# Patient Record
Sex: Male | Born: 1950
Health system: Southern US, Community
[De-identification: ages and names within clinical notes are randomized; demographics above are authoritative.]

## PROBLEM LIST (undated history)

## (undated) DIAGNOSIS — I200 Unstable angina: Secondary | ICD-10-CM

## (undated) DIAGNOSIS — C801 Malignant (primary) neoplasm, unspecified: Secondary | ICD-10-CM

## (undated) DIAGNOSIS — IMO0002 Reserved for concepts with insufficient information to code with codable children: Secondary | ICD-10-CM

## (undated) DIAGNOSIS — I219 Acute myocardial infarction, unspecified: Secondary | ICD-10-CM

## (undated) DIAGNOSIS — D369 Benign neoplasm, unspecified site: Secondary | ICD-10-CM

## (undated) DIAGNOSIS — R972 Elevated prostate specific antigen [PSA]: Secondary | ICD-10-CM

## (undated) DIAGNOSIS — I639 Cerebral infarction, unspecified: Secondary | ICD-10-CM

## (undated) DIAGNOSIS — I509 Heart failure, unspecified: Secondary | ICD-10-CM

## (undated) DIAGNOSIS — I739 Peripheral vascular disease, unspecified: Secondary | ICD-10-CM

## (undated) DIAGNOSIS — J449 Chronic obstructive pulmonary disease, unspecified: Secondary | ICD-10-CM

## (undated) DIAGNOSIS — IMO0001 Reserved for inherently not codable concepts without codable children: Secondary | ICD-10-CM

## (undated) DIAGNOSIS — E785 Hyperlipidemia, unspecified: Secondary | ICD-10-CM

## (undated) DIAGNOSIS — Z720 Tobacco use: Secondary | ICD-10-CM

## (undated) DIAGNOSIS — I2510 Atherosclerotic heart disease of native coronary artery without angina pectoris: Secondary | ICD-10-CM

## (undated) HISTORY — DX: Acute myocardial infarction, unspecified: I21.9

## (undated) HISTORY — DX: Malignant (primary) neoplasm, unspecified: C80.1

## (undated) HISTORY — PX: COLONOSCOPY W/ BIOPSIES: SHX1374

## (undated) HISTORY — DX: Reserved for inherently not codable concepts without codable children: IMO0001

## (undated) HISTORY — DX: Unstable angina: I20.0

## (undated) HISTORY — DX: Benign neoplasm, unspecified site: D36.9

## (undated) HISTORY — DX: Reserved for concepts with insufficient information to code with codable children: IMO0002

## (undated) HISTORY — DX: Heart failure, unspecified: I50.9

## (undated) HISTORY — PX: OTHER SURGICAL HISTORY: SHX169

## (undated) HISTORY — DX: Chronic obstructive pulmonary disease, unspecified: J44.9

## (undated) HISTORY — DX: Cerebral infarction, unspecified: I63.9

---

## 2007-08-08 ENCOUNTER — Ambulatory Visit: Payer: Self-pay | Admitting: Gastroenterology

## 2007-08-15 ENCOUNTER — Ambulatory Visit: Payer: Self-pay | Admitting: Gastroenterology

## 2007-08-15 ENCOUNTER — Encounter: Payer: Self-pay | Admitting: Gastroenterology

## 2008-02-24 ENCOUNTER — Encounter: Admission: RE | Admit: 2008-02-24 | Discharge: 2008-02-24 | Payer: Self-pay | Admitting: Family Medicine

## 2008-02-29 ENCOUNTER — Ambulatory Visit: Payer: Self-pay | Admitting: Cardiology

## 2008-03-12 ENCOUNTER — Ambulatory Visit: Payer: Self-pay

## 2008-04-03 ENCOUNTER — Ambulatory Visit: Payer: Self-pay | Admitting: Cardiovascular Disease

## 2008-04-30 ENCOUNTER — Encounter: Admission: RE | Admit: 2008-04-30 | Discharge: 2008-04-30 | Payer: Self-pay | Admitting: Interventional Cardiology

## 2008-05-29 ENCOUNTER — Ambulatory Visit: Payer: Self-pay | Admitting: Vascular Surgery

## 2008-06-29 HISTORY — PX: OTHER SURGICAL HISTORY: SHX169

## 2008-12-27 DIAGNOSIS — I2510 Atherosclerotic heart disease of native coronary artery without angina pectoris: Secondary | ICD-10-CM

## 2008-12-27 HISTORY — DX: Atherosclerotic heart disease of native coronary artery without angina pectoris: I25.10

## 2009-01-21 ENCOUNTER — Encounter: Admission: RE | Admit: 2009-01-21 | Discharge: 2009-01-21 | Payer: Self-pay | Admitting: Interventional Cardiology

## 2009-01-25 ENCOUNTER — Ambulatory Visit (HOSPITAL_COMMUNITY): Admission: EM | Admit: 2009-01-25 | Discharge: 2009-01-26 | Payer: Self-pay | Admitting: *Deleted

## 2009-01-25 ENCOUNTER — Inpatient Hospital Stay (HOSPITAL_BASED_OUTPATIENT_CLINIC_OR_DEPARTMENT_OTHER): Admission: RE | Admit: 2009-01-25 | Discharge: 2009-01-25 | Payer: Self-pay | Admitting: Interventional Cardiology

## 2009-01-25 ENCOUNTER — Ambulatory Visit: Payer: Self-pay | Admitting: *Deleted

## 2009-02-01 ENCOUNTER — Ambulatory Visit: Payer: Self-pay | Admitting: Vascular Surgery

## 2009-02-12 ENCOUNTER — Ambulatory Visit: Payer: Self-pay | Admitting: Vascular Surgery

## 2009-02-19 ENCOUNTER — Encounter: Admission: RE | Admit: 2009-02-19 | Discharge: 2009-02-19 | Payer: Self-pay | Admitting: Vascular Surgery

## 2009-02-28 ENCOUNTER — Ambulatory Visit: Payer: Self-pay | Admitting: Vascular Surgery

## 2009-03-21 ENCOUNTER — Ambulatory Visit: Payer: Self-pay | Admitting: Vascular Surgery

## 2009-04-02 ENCOUNTER — Inpatient Hospital Stay (HOSPITAL_COMMUNITY): Admission: RE | Admit: 2009-04-02 | Discharge: 2009-04-06 | Payer: Self-pay | Admitting: Vascular Surgery

## 2009-04-02 ENCOUNTER — Ambulatory Visit: Payer: Self-pay | Admitting: Vascular Surgery

## 2009-04-09 ENCOUNTER — Ambulatory Visit: Payer: Self-pay | Admitting: Vascular Surgery

## 2009-04-18 ENCOUNTER — Ambulatory Visit: Payer: Self-pay | Admitting: Vascular Surgery

## 2009-06-20 ENCOUNTER — Ambulatory Visit: Payer: Self-pay | Admitting: Vascular Surgery

## 2009-06-29 DIAGNOSIS — I639 Cerebral infarction, unspecified: Secondary | ICD-10-CM

## 2009-06-29 HISTORY — DX: Cerebral infarction, unspecified: I63.9

## 2009-07-28 ENCOUNTER — Inpatient Hospital Stay (HOSPITAL_COMMUNITY): Admission: EM | Admit: 2009-07-28 | Discharge: 2009-08-01 | Payer: Self-pay | Admitting: Emergency Medicine

## 2009-07-30 ENCOUNTER — Encounter (INDEPENDENT_AMBULATORY_CARE_PROVIDER_SITE_OTHER): Payer: Self-pay | Admitting: Family Medicine

## 2009-07-31 ENCOUNTER — Ambulatory Visit: Payer: Self-pay | Admitting: Physical Medicine & Rehabilitation

## 2009-08-01 ENCOUNTER — Inpatient Hospital Stay (HOSPITAL_COMMUNITY)
Admission: RE | Admit: 2009-08-01 | Discharge: 2009-08-14 | Payer: Self-pay | Admitting: Physical Medicine & Rehabilitation

## 2009-09-23 ENCOUNTER — Encounter
Admission: RE | Admit: 2009-09-23 | Discharge: 2009-12-22 | Payer: Self-pay | Admitting: Physical Medicine & Rehabilitation

## 2009-09-24 ENCOUNTER — Ambulatory Visit: Payer: Self-pay | Admitting: Physical Medicine & Rehabilitation

## 2009-10-01 ENCOUNTER — Encounter
Admission: RE | Admit: 2009-10-01 | Discharge: 2009-12-30 | Payer: Self-pay | Admitting: Physical Medicine & Rehabilitation

## 2009-10-22 ENCOUNTER — Ambulatory Visit: Payer: Self-pay | Admitting: Physical Medicine & Rehabilitation

## 2009-11-04 ENCOUNTER — Ambulatory Visit: Payer: Self-pay | Admitting: Vascular Surgery

## 2010-07-20 ENCOUNTER — Encounter: Payer: Self-pay | Admitting: Cardiovascular Disease

## 2010-09-14 LAB — COMPREHENSIVE METABOLIC PANEL
Alkaline Phosphatase: 70 U/L (ref 39–117)
CO2: 25 mEq/L (ref 19–32)
Calcium: 9 mg/dL (ref 8.4–10.5)
Chloride: 104 mEq/L (ref 96–112)
Creatinine, Ser: 1.18 mg/dL (ref 0.4–1.5)
Glucose, Bld: 103 mg/dL — ABNORMAL HIGH (ref 70–99)
Potassium: 3.4 mEq/L — ABNORMAL LOW (ref 3.5–5.1)
Sodium: 135 mEq/L (ref 135–145)

## 2010-09-14 LAB — GLUCOSE, CAPILLARY: Glucose-Capillary: 115 mg/dL — ABNORMAL HIGH (ref 70–99)

## 2010-09-14 LAB — LIPID PANEL
HDL: 38 mg/dL — ABNORMAL LOW (ref >39)
Total CHOL/HDL Ratio: 5.8 RATIO
Triglycerides: 212 mg/dL — ABNORMAL HIGH (ref <150)
VLDL: 42 mg/dL — ABNORMAL HIGH (ref 0–40)

## 2010-09-14 LAB — DIFFERENTIAL
Eosinophils Absolute: 0.3 10*3/uL (ref 0.0–0.7)
Lymphocytes Relative: 36 % (ref 12–46)
Lymphs Abs: 2.8 10*3/uL (ref 0.7–4.0)
Monocytes Absolute: 0.6 10*3/uL (ref 0.1–1.0)
Monocytes Relative: 8 % (ref 3–12)

## 2010-09-14 LAB — POCT I-STAT, CHEM 8
BUN: 15 mg/dL (ref 6–23)
Calcium, Ion: 1.19 mmol/L (ref 1.12–1.32)
Creatinine, Ser: 1.1 mg/dL (ref 0.4–1.5)
Hemoglobin: 14.3 g/dL (ref 13.0–17.0)
Sodium: 141 mEq/L (ref 135–145)

## 2010-09-14 LAB — CBC
HCT: 43 % (ref 39.0–52.0)
Hemoglobin: 14.7 g/dL (ref 13.0–17.0)
Platelets: 200 10*3/uL (ref 150–400)
RBC: 4.76 MIL/uL (ref 4.22–5.81)
WBC: 7.7 10*3/uL (ref 4.0–10.5)

## 2010-09-17 LAB — CBC
Hemoglobin: 14.6 g/dL (ref 13.0–17.0)
MCHC: 33.9 g/dL (ref 30.0–36.0)
Platelets: 184 10*3/uL (ref 150–400)
RDW: 14.1 % (ref 11.5–15.5)

## 2010-09-17 LAB — DIFFERENTIAL
Basophils Relative: 1 % (ref 0–1)
Eosinophils Absolute: 0.2 10*3/uL (ref 0.0–0.7)
Eosinophils Relative: 2 % (ref 0–5)
Monocytes Absolute: 0.6 10*3/uL (ref 0.1–1.0)
Monocytes Relative: 8 % (ref 3–12)

## 2010-09-17 LAB — CARDIAC PANEL(CRET KIN+CKTOT+MB+TROPI)
Relative Index: INVALID (ref 0.0–2.5)
Total CK: 43 U/L (ref 7–232)

## 2010-09-17 LAB — COMPREHENSIVE METABOLIC PANEL
ALT: 24 U/L (ref 0–53)
Albumin: 3.5 g/dL (ref 3.5–5.2)
Alkaline Phosphatase: 67 U/L (ref 39–117)
Potassium: 3.9 mEq/L (ref 3.5–5.1)
Sodium: 140 mEq/L (ref 135–145)
Total Protein: 6.3 g/dL (ref 6.0–8.3)

## 2010-10-02 LAB — TYPE AND SCREEN

## 2010-10-02 LAB — CBC
HCT: 36 % — ABNORMAL LOW (ref 39.0–52.0)
HCT: 45.1 % (ref 39.0–52.0)
Hemoglobin: 13.3 g/dL (ref 13.0–17.0)
MCHC: 34.5 g/dL (ref 30.0–36.0)
MCV: 92.8 fL (ref 78.0–100.0)
MCV: 93.2 fL (ref 78.0–100.0)
MCV: 94.5 fL (ref 78.0–100.0)
Platelets: 127 10*3/uL — ABNORMAL LOW (ref 150–400)
Platelets: 141 10*3/uL — ABNORMAL LOW (ref 150–400)
Platelets: 197 10*3/uL (ref 150–400)
RBC: 4.07 MIL/uL — ABNORMAL LOW (ref 4.22–5.81)
RBC: 4.14 MIL/uL — ABNORMAL LOW (ref 4.22–5.81)
RDW: 13.2 % (ref 11.5–15.5)
RDW: 13.2 % (ref 11.5–15.5)
WBC: 13.3 10*3/uL — ABNORMAL HIGH (ref 4.0–10.5)
WBC: 5.9 10*3/uL (ref 4.0–10.5)
WBC: 9.3 10*3/uL (ref 4.0–10.5)
WBC: 9.7 10*3/uL (ref 4.0–10.5)

## 2010-10-02 LAB — POCT I-STAT 7, (LYTES, BLD GAS, ICA,H+H)
Acid-base deficit: 2 mmol/L (ref 0.0–2.0)
Bicarbonate: 22.5 mEq/L (ref 20.0–24.0)
O2 Saturation: 100 %
pO2, Arterial: 184 mmHg — ABNORMAL HIGH (ref 80.0–100.0)

## 2010-10-02 LAB — BASIC METABOLIC PANEL
BUN: 9 mg/dL (ref 6–23)
CO2: 23 mEq/L (ref 19–32)
Calcium: 7.9 mg/dL — ABNORMAL LOW (ref 8.4–10.5)
Calcium: 8.4 mg/dL (ref 8.4–10.5)
Chloride: 111 mEq/L (ref 96–112)
Creatinine, Ser: 0.96 mg/dL (ref 0.4–1.5)
GFR calc Af Amer: >60 mL/min (ref >60)
GFR calc non Af Amer: >60 mL/min (ref >60)
Glucose, Bld: 136 mg/dL — ABNORMAL HIGH (ref 70–99)
Sodium: 138 mEq/L (ref 135–145)

## 2010-10-02 LAB — COMPREHENSIVE METABOLIC PANEL
ALT: 12 U/L (ref 0–53)
AST: 24 U/L (ref 0–37)
Albumin: 2.8 g/dL — ABNORMAL LOW (ref 3.5–5.2)
Albumin: 4.2 g/dL (ref 3.5–5.2)
Alkaline Phosphatase: 46 U/L (ref 39–117)
BUN: 16 mg/dL (ref 6–23)
CO2: 24 mEq/L (ref 19–32)
Chloride: 107 mEq/L (ref 96–112)
Chloride: 108 mEq/L (ref 96–112)
Creatinine, Ser: 0.9 mg/dL (ref 0.4–1.5)
Creatinine, Ser: 1.05 mg/dL (ref 0.4–1.5)
GFR calc Af Amer: >60 mL/min (ref >60)
GFR calc non Af Amer: >60 mL/min (ref >60)
GFR calc non Af Amer: >60 mL/min (ref >60)
Potassium: 3.7 mEq/L (ref 3.5–5.1)
Total Bilirubin: 0.7 mg/dL (ref 0.3–1.2)
Total Bilirubin: 0.8 mg/dL (ref 0.3–1.2)

## 2010-10-02 LAB — PROTIME-INR
INR: 0.9 (ref 0.00–1.49)
INR: 1.05 (ref 0.00–1.49)
Prothrombin Time: 12.4 seconds (ref 11.6–15.2)

## 2010-10-02 LAB — URINALYSIS, ROUTINE W REFLEX MICROSCOPIC
Bilirubin Urine: NEGATIVE
Ketones, ur: NEGATIVE mg/dL
Nitrite: NEGATIVE
Protein, ur: NEGATIVE mg/dL
Specific Gravity, Urine: 1.004 — ABNORMAL LOW (ref 1.005–1.030)
Urobilinogen, UA: 0.2 mg/dL (ref 0.0–1.0)

## 2010-10-02 LAB — BLOOD GAS, ARTERIAL
Acid-base deficit: 1.9 mmol/L (ref 0.0–2.0)
Patient temperature: 98.6
TCO2: 23 mmol/L (ref 0–100)

## 2010-10-02 LAB — MRSA PCR SCREENING: MRSA by PCR: NEGATIVE

## 2010-10-02 LAB — APTT: aPTT: 24 seconds (ref 24–37)

## 2010-10-05 LAB — CBC
HCT: 39.1 % (ref 39.0–52.0)
MCV: 92.6 fL (ref 78.0–100.0)
Platelets: 201 10*3/uL (ref 150–400)
RBC: 4.22 MIL/uL (ref 4.22–5.81)
WBC: 8.3 10*3/uL (ref 4.0–10.5)

## 2010-10-05 LAB — BASIC METABOLIC PANEL
BUN: 16 mg/dL (ref 6–23)
Chloride: 108 mEq/L (ref 96–112)
GFR calc Af Amer: >60 mL/min (ref >60)
GFR calc non Af Amer: >60 mL/min (ref >60)
Potassium: 3.9 mEq/L (ref 3.5–5.1)

## 2010-11-11 NOTE — Discharge Summary (Signed)
NAMECASSIE, [name]              ACCOUNT NO.:  [id number]   MEDICAL RECORD NO.:  [id number]          PATIENT TYPE:  OIB   LOCATION:  2021                         FACILITY:  [facility]   PHYSICIAN:  Balinda Quails, M.D.    DATE OF BIRTH:  29-Sep-1950   DATE OF ADMISSION:  01/25/2009  DATE OF DISCHARGE:  01/26/2009                               DISCHARGE SUMMARY   FINAL DISCHARGE DIAGNOSES:  1. Ischemic right upper extremity following cardiac catheterization.  2. Coronary artery disease.   PROCEDURES PERFORMED:  Thrombectomy of right brachial artery.   COMPLICATIONS:  None.   CONDITION AT DISCHARGE:  Stable improving.   DISCHARGE MEDICATIONS:  He is instructed to resume all previous  medications consisting of multiple vitamins as directed on niacin 100 mg  as directed, fish oil 500 mg as directed, and he is given a prescription  for Percocet 5/325 one p.o. q.4 h. p.r.n. pain.   DISPOSITION:  He is being discharged home in stable condition with his  wound healing well.  He is given careful instructions regarding the care  of his wound.  He is given a return appointment to see Dr. Madilyn Fireman in 2  weeks.  The office will arrange a visit.   BRIEF IDENTIFYING STATEMENT:  This very pleasant 60 year old man  underwent cardiac catheterization via brachial artery.  Following the  procedure in the post anesthesia care unit, he was noted to have  decreased pulses in his right arm.  Dr. Madilyn Fireman evaluated him and felt him  to have a brachial thrombus.  He recommended thrombectomy.  Mr. Whilden  was informed of the risks and benefits of the procedure and after  careful consideration, he elected to proceed with surgery.   HOSPITAL COURSE:  He was taken to the operating room and underwent the  aforementioned right brachial thrombectomy.  For complete details,  please refer to the typed operative report.  The procedure was without  complications.  He was returned to the post anesthesia care unit  extubated.  Following extubation, he was admitted  for 23-hour observation period.  The following morning, he was found to  be stand in stable condition.  His wound was healing well.  He had  palpable pulses.  He was desirous of discharge and was discharged home  in stable condition.      Wilmon Arms, PA      P. Liliane Bade, M.D.  Electronically Signed    KEL/MEDQ  D:  01/26/2009  T:  01/27/2009  Job:  [id number]

## 2010-11-11 NOTE — Assessment & Plan Note (Signed)
OFFICE VISIT   [name], [name]  DOB:  12/12/1950                                       06/20/2009  [id number]#:[id number]   I saw this patient in the office today for follow up after his  aortofemoral bypass graft which was done on 04/02/2009.  His complaints  are some soreness in his incision occasionally and also some tingling in  his toes.  His appetite has returned to normal and he has been gradually  resuming his normal activities.  He has been working in the yard some.  He has not yet returned to work.   PHYSICAL EXAMINATION:  On examination, blood pressure is 162/103, heart  rate is 71, temperature 98.  Lungs:  Clear bilaterally to auscultation.  Abdomen:  Soft, nontender.  His incision has healed nicely with no  evidence of a hernia.  He has palpable femoral and pedal pulses  bilaterally.   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  I think he would be at increased risk  for hernia if he did this type of lifting before 6 months  postoperatively.  For this reason, I have given him a return-to-work  slip for October 01, 2008.  I plan on seeing him back in 6 months with  follow-up ABIs.  He knows to call sooner if he has problems.     Di Kindle. Edilia Bo, M.D.  Electronically Signed   CSD/MEDQ  D:  06/20/2009  T:  06/25/2009  Job:  [id number]

## 2010-11-11 NOTE — Assessment & Plan Note (Signed)
OFFICE VISIT   [name], [name]  DOB:  Dec 04, 1950                                       02/28/2009  [id number]#:[id number]   I saw the patient in the office today for continued followup of his  aortoiliac occlusive disease.  When I saw him last we were discussing  possibly proceeding with aortofemoral bypass grafting.  It has been some  time since he had had a CT angiogram so he had a followup CT angio and  came in to discuss these results.   CT angio does show a right common iliac artery occlusion and external  iliac artery occlusion.  On the left side he has an external iliac  artery occlusion.  His femoral vessels and below are patent and I think  he would be a candidate for aortofemoral bypass grafting.  Consideration  should be given for an end-to-side proximal anastomosis given the  bilateral external iliac artery occlusions if this is technically  possible.  He has disabling claudication which is really making it  difficult for him to work.  We have previously discussed nonoperative  treatment including tobacco cessation and a structured walking program  and he is working on this as best he can.   PHYSICAL EXAMINATION:  Blood pressure is 133/87, heart rate is 76.  Lungs are clear bilaterally to auscultation.  I cannot palpate femoral  pulses.  Both feet appear adequately perfused without ischemic ulcers.   He currently would like to hold off on aortofemoral bypass grafting as  he has some concerns about the finances involved and is looking into his  [employer] benefits.  He would like to be seen back in 3 months.  We will check  ABIs at that time.  He knows to call sooner if he has problems.  In the  meantime he will continue working on his tobacco cessation and  structured walking program.   Di Kindle. Edilia Bo, M.D.  Electronically Signed   CSD/MEDQ  D:  02/28/2009  T:  03/01/2009  Job:  [id number]   cc:   Lyn Records, M.D.  Ernestina Penna, M.D.

## 2010-11-11 NOTE — Op Note (Signed)
NAMESOU, NOHR              ACCOUNT NO.:  [id number]   MEDICAL RECORD NO.:  [id number]          PATIENT TYPE:  OIB   LOCATION:  2021                         FACILITY:  [facility]   PHYSICIAN:  Balinda Quails, M.D.    DATE OF BIRTH:  1951/05/06   DATE OF PROCEDURE:  01/25/2009  DATE OF DISCHARGE:                               OPERATIVE REPORT   SURGEON:  Balinda Quails, MD   ASSISTANT:  Wilmon Arms, PA   ANESTHESIA:  General endotracheal.   ANESTHESIOLOGIST:  Kaylyn Layer. Ossey, MD   PREOPERATIVE DIAGNOSIS:  Ischemic right upper extremity.   POSTOPERATIVE DIAGNOSIS:  Ischemic right upper extremity.   PROCEDURES:  1. Exploration of right brachial artery.  2. Thrombectomy and repair of right brachial artery.   CLINICAL NOTE:  [name] is a 60 year old gentleman with advanced  peripheral vascular disease and severe aortoiliac occlusive disease.  He  underwent cardiac catheterization via right brachial approach by Dr.  Katrinka Blazing today.  In the recovery area following his catheterization, he was  noted to have a cool tingling right hand with absent radial and ulnar  pulses.  Consultation revealed evidence of a right brachial artery  occlusion.  Brought to the operating room at this time for exploration  of the right brachial artery.   OPERATIVE PROCEDURE:  The patient brought to the operating room in  stable condition.  Placed under general endotracheal anesthesia.  The  right arm prepped and draped in sterile fashion.  Skin and subcutaneous  tissues instilled with 1% Xylocaine with epinephrine.   A curvilinear skin incision made along the course of the right brachial  artery and the antecubital fossa.  Dissection carried through the  subcutaneous tissue with electrocautery.  Basilic vein was mobilized and  reflected medially.  The brachial artery identified.  A short segment of  brachial artery dissected out and the site of catheterization was  evident.  The artery was  thrombosed.  The patient administered 5000  units of heparin intravenously.  The brachial artery controlled with  bulldog clamps.  A transverse arteriotomy made at the site of  catheterization.  Fresh thrombus was present.  This was removed with  direct vision initially.  A 3 Fogarty was then passed distally down the  brachial artery without return of significant thrombus and good  backbleeding obtained.  A 3 Fogarty was then passed proximally up to the  brachial artery.  Several passes made, thrombus removed with excellent  inflow obtained.  No further thrombus returned.  Transverse arteriotomy  was closed with interrupted 7-0 Prolene suture.  The vessel then flushed  and clamps removed.  Excellent flow present by Doppler.  Adequate  hemostasis obtained.  Sponge and instrument counts correct.   The subcutaneous tissue then closed with running 3-0 Vicryl suture in a  single layer.  The skin closed with 4-0 Monocryl and Dermabond.  The  patient transferred to the recovery room in stable condition.      Balinda Quails, M.D.  Electronically Signed     Balinda Quails, M.D.  Electronically Signed  PGH/MEDQ  D:  01/25/2009  T:  01/26/2009  Job:  [id number]   cc:   Lyn Records, M.D.

## 2010-11-11 NOTE — Assessment & Plan Note (Signed)
[facility]                            CARDIOLOGY OFFICE NOTE   YUREM, VINER                       MRN:          [id number]  DATE:02/29/2008                            DOB:          10/19/1950    PRIMARY CARE Chaniqua Brisby:  Belva Agee, NP   REASON FOR PRESENTATION:  Evaluate the patient with claudication and  multiple cardiovascular risk factors.   HISTORY OF PRESENT ILLNESS:  The patient is a pleasant 60 year old  gentleman without a prior cardiac history.  However, he has significant  cardiovascular risk factors.  He does describe leg pain.  This happens  when he walks moderate distance on a level ground.  He will get pain in  his calves and over the time, he has now developed pain in his hips as  well.  It stops when he rests.  He is still able to do his job and  chores of daily living.  He does get some chest discomfort sporadically.  It is not reproducible with exertion.  He does not describe any neck or  arm discomfort.  He has not had any palpitation, presyncope, or syncope.  He has no PND or orthopnea and denies any significant dyspnea with  exertion.  He has had no prior cardiovascular testing.   PAST MEDICAL HISTORY:  Hyperlipidemia and tobacco abuse.   PAST SURGICAL HISTORY:  Vasectomy.   ALLERGIES:  None.   MEDICATIONS:  Multivitamin, fish oil, niacin over the counter, red yeast  rice, EDTA.   SOCIAL HISTORY:  The patient is a Naval architect.  He is married.  He has  3 children.  He has been smoking one and a half packs of cigarettes for  40 years.  He drinks alcohol socially.   FAMILY HISTORY:  Very remarkable for early coronary artery disease with  his father dying at 44 of myocardial infarction, another brother died of  a myocardial infarction at 28, and another brother with a myocardial  infarction at 38, is still alive.   REVIEW OF SYSTEMS:  As stated in the HPI and negative for all other  systems.   PHYSICAL  EXAMINATION:  GENERAL:  The patient is pleasant and in no  distress.  VITAL SIGNS:  Blood pressure 120/84, heart rate 71 and regular, weight  192 pounds, and body mass index 28.  HEENT:  Eyelids are unremarkable; pupils equal, round, and reactive to  light; fundi not visualized; oral mucosa unremarkable; edentulous.  NECK:  No jugular venous distention at 45 degrees, carotid upstroke  brisk and symmetric, no bruits, no thyromegaly.  LYMPHATICS:  No cervical, axillary, or inguinal adenopathy.  LUNGS:  Decreased breath sounds without wheezing or crackles.  BACK:  No costovertebral angle tenderness.  CHEST:  Unremarkable.  HEART:  PMI not displaced or sustained, S1 and S2 within normal limits,  no S3, no S4, no clicks, no rubs, no murmurs.  ABDOMEN:  Flat, positive bowel sounds normal in frequency and pitch, no  bruits, no rebound, no guarding, no midline pulsatile mass, no  hepatomegaly, no splenomegaly.  SKIN:  No rashes, no nodules.  EXTREMITIES:  2+ upper pulses, absent femorals bilaterally, absent  dorsalis pedis, posterior tibialis, and poplietals bilaterally, no  cyanosis, no clubbing, no edema.  NEURO:  Oriented to person, place, and time.  Cranial nerves II through  XII grossly intact.  Motor grossly intact.   EKG:  Sinus rhythm, rate 71, leftward axis, incomplete right bundle-  branch block, no acute ST-T wave changes.   ASSESSMENT AND PLAN:  1. Peripheral vascular disease.  The patient has claudication and      decreased pulses and almost definitely peripheral vascular disease.      We will get ABI's and proceed from there.  He needs aggressive risk      reduction.  2. Chest discomfort.  The patient has some chest discomfort and      multiple cardiovascular risk factors.  The  pretest probability of      obstructive coronary disease is least moderately high.  He needs a      stress perfusion study.  We will try an exercise perfusion study.      I do not know should he be  able to walk on the treadmill with his      claudication.  If not it can be converted to an adenosine      Cardiolite.  Further evaluation will be based on these results.  I      have discussed with the patient that his probability of having at      least nonobstructive coronary disease is nearly 100%, and he needs      a fairly aggressive risk reduction.  3. Tobacco.  We spent quite a bit of time talking about the need to      stop smoking.  He has apparently tried Chantix, and it gave him bad      dreams.  He is going to thing about nicotine replacement, but      probably will try cold [place] if he tries anything at all (greater      than 3 minutes discussing this).  4. Dyslipidemia.  I have counseled him on indication for statin and      would suggest this given his very strong family history and      multiple risk factors and evidence of peripheral vascular disease.      He is going to consider this.  For now, he is continuing fish oil,      niacin, and red yeast rice and understands that I think his goal      LDL should be at least less than 100 and ideally less than 70 with      an HDL greater than 40 and ideally greater than 50.  He will not      get there with these are over-the-counter therapies.  5. Followup.  I would like to see him back in a few months or sooner      based on the results of any abnormalities on this test or      increasing symptoms.     Rollene Rotunda, MD, [facility]  Electronically Signed    JH/MedQ  DD: 02/29/2008  DT: 03/01/2008  Job #: [id number]   cc:   Belva Agee NP

## 2010-11-11 NOTE — Assessment & Plan Note (Signed)
OFFICE VISIT   [name], [name]  DOB:  Apr 28, 1951                                       04/18/2009  BJYNW#:[id number]   I saw the patient in the office today for followup after his recent  aortofemoral bypass graft.  This is a pleasant 60 year old gentleman who  I have been following with aortoiliac occlusive disease.  His symptoms  progressed and he wished to pursue revascularization.  I felt he was a  reasonable candidate for aortofemoral bypass grafting.  He underwent  preoperative cardiac evaluation by Dr. Katrinka Blazing and underwent aortofemoral  bypass graft on 04/02/2009.  He did well postoperatively and was  discharged on postoperative day #4 and he returns for his first  outpatient visit.   Overall he has been doing quite well and he has been ambulating without  difficulty and has no specific complaints.   PHYSICAL EXAMINATION:  Blood pressure is 131/86, heart rate is 73.  His  abdominal incision has healed nicely as are his groin incisions.  He has  palpable femoral, dorsalis pedis and posterior tibial pulses  bilaterally.  His feet are warm and well-perfused without ischemic  ulcers.   He had Doppler study in the office today which shows ABIs of 100%  bilaterally.   Overall I am pleased with his progress and I have encouraged him to  gradually resume his normal activities.  He does a fair amount of heavy  lifting at work and is not ready to return to work probably until  February.  I plan on seeing him back in 2 months.  He knows to call  sooner if he has problems.   Di Kindle. Edilia Bo, M.D.  Electronically Signed   CSD/MEDQ  D:  04/18/2009  T:  04/19/2009  Job:  [id number]   cc:   Ernestina Penna, M.D.  Lyn Records, M.D.

## 2010-11-11 NOTE — Assessment & Plan Note (Signed)
OFFICE VISIT   [name], [name]  DOB:  Feb 10, 1951                                       02/01/2009  EAVWU#:[id number]   The patient presents today for follow-up after right brachial  exploration, thrombectomy and repair.  He had undergone cardiac  catheterization and had a postoperative complication of brachial artery  occlusion.  He has numbness and discomfort in his forearm.  Fortunately,  his hand is well perfused, he has a 2+ radial and ulnar pulse and his  incision is healing well over his brachial artery.  I explained this is  related to both ischemia around the time of the occlusion and irritation  of the nerves with the brachial artery exploration and this should  resolve with time.  He is questioning on return to work.  He does heavy  work with this arm and as such he needs to be out at least 2 weeks then  return to work for 08/17.  He is having increasingly severe lower  extremity claudication symptoms.  He had been evaluated by Dr. Edilia Bo  in the past and will see him in the next several weeks for discussion  about potential aortofemoral bypass graft.   Larina Earthly, M.D.  Electronically Signed   TFE/MEDQ  D:  02/01/2009  T:  02/04/2009  Job:  [id number]   cc:   Lyn Records, M.D.  Di Kindle. Edilia Bo, M.D.

## 2010-11-11 NOTE — Cardiovascular Report (Signed)
[name], [name] NO.:  [id number]   MEDICAL RECORD NO.:  [id number]          PATIENT TYPE:  OIB   LOCATION:  1967                         FACILITY:  [facility]   PHYSICIAN:  Lyn Records, M.D.   DATE OF BIRTH:  05-24-1951   DATE OF PROCEDURE:  01/25/2009  DATE OF DISCHARGE:  01/25/2009                            CARDIAC CATHETERIZATION   ADDENDUM TO THE CARDIAC CATHETERIZATION REPORT:  [name] experienced  numbness and tingling in his hand after sheath removal and manual  compression was completed.  The patient came to the recovery area with a  3+ radial pulse in the right arm.  The sheath was removed and pressure  was held.  The initial 15 minutes of pressure was associated with  continued palpable pulse.  After pressure hold was discontinued, there  was slight bleeding from the puncture site.  Again pressure was held.  During this second session of pressure holding, the patient's radial  pulse disappeared.  Hemostasis was eventually achieved after 10-15  minutes.  Doppler evaluation demonstrated a pulse.  There was good color  in the patient's hand, but there was mild tingling.  Because of the  dramatic change in pulse amplitude and mild symptoms, I consulted Dr.  Bud Face of the [facility].  He  will evaluate the patient and determine if exploration of the brachial  puncture site is necessary to remove suspected thrombus.   The patient and the wife were apprised of the findings and the  suspicions.   The procedure was completed at around 1:30 p.m. or slightly before.  The  reduction in pulse was noted at around 2:10 p.m. after sheath hold was  completed.      Lyn Records, M.D.  Electronically Signed     HWS/MEDQ  D:  01/25/2009  T:  01/25/2009  Job:  [id number]   cc:   Ernestina Penna, M.D.  Di Kindle. Edilia Bo, M.D.  Balinda Quails, M.D.

## 2010-11-11 NOTE — Consult Note (Signed)
VASCULAR SURGERY CONSULTATION   CORTEZ, FLIPPEN  DOB:  Feb 26, 1951                                       05/29/2008  ZOXWR#:[id number]   I saw the patient in the office today in consultation concerning  bilateral iliac artery occlusions.  Referred by Dr. Katrinka Blazing.  This is a  pleasant 60 year old gentleman with a long history of bilateral thigh  and calf claudication which he has had for years.  Over the last several  months his symptoms have gradually progressed and he experiences  claudication currently at a short distance of approximately 1 block.  The symptoms are equal on both sides.  They are brought on by ambulation  and relieved with rest.  He does also admit to some occasional cramps in  his legs and pain in his legs when he is sitting, and apparently has  been evaluated by neurology for possible cervical disk disease.  I do  not get any clear-cut history of rest pain or history of nonhealing  ulcers.  He cannot remember any sudden onset of pain, except perhaps  approximately 10 years ago when he is at work he experienced some sudden  pain in his legs.  He did undergo a CT angiogram which showed a right  common iliac artery occlusion and a left external iliac artery occlusion  and therefore he was sent for vascular consultation.   PAST MEDICAL HISTORY:  Significant for hypercholesterolemia.  He denies  any history of diabetes, hypertension, history of previous myocardial  infarction, history of congestive heart failure or history of COPD.   FAMILY HISTORY:  He has had 2 brothers who had heart attacks at age 33.  His father also had coronary artery disease.   SOCIAL HISTORY:  He is married.  He has 3 children.  Works as a Ecologist.  He smoked a pack per day of cigarettes for over 40 years.   ALLERGIES:  No known drug allergies.   MEDICATIONS:  1. Crestor 5 mg p.o. daily.  2. Multivitamin.  3. Niacin.  4. Baby aspirin.  5. Fish oil.   REVIEW OF  SYSTEMS:  GENERAL:  He had no recent weight loss or weight  gain.  He is 190 pounds 5 feet 9 inches tall.  CARDIAC:  He has had some occasional chest tightness although he states  that he had a Cardiolite this summer at the [place] office which was  unremarkable.  He does admit to dyspnea on exertion.  VASCULAR:  He does have claudication in both lower extremities and also  some mild pain in his feet at night.  He had no history of stroke, TIAs  or history of DVT or phlebitis.  Pulmonary, GI, GU, neuro, ortho, psychiatric, ENT, and hematology review  of systems are unremarkable and are documented on the medical history  form in his chart.   PHYSICAL EXAMINATION:  This is a pleasant 60 year old gentleman who  appears his stated age.  His blood pressure 135/84, heart rate is 60.  Neck is supple.  There is no cervical lymphadenopathy.  I do not detect  any carotid bruits.  Lungs are clear bilaterally to auscultation.  On  cardiac exam he has a regular rate and rhythm.  His abdomen is soft and  nontender.  I cannot palpate an aneurysm.  He has normal pitched bowel  sounds.  I cannot palpate femoral popliteal pedal pulses on either side.  Both feet appear adequately perfused without ischemic ulcers.  No  significant lower extremity swelling.  Neurologic:  Exam is nonfocal.  He has good strength in his upper extremities and lower extremities  bilaterally.   I have reviewed his CT scan which shows the infrarenal aorta is patent.  There is then laminated thrombus in the distal aorta and the right  common iliac artery is occluded at its origin.  There is reconstitution  on the right of the common femoral artery.  On the left side the left  external iliac artery is occluded and there is reconstitution again of  the common femoral artery.  The superficial femoral artery appears to be  patent bilaterally.  He appears to have patent 3-vessel runoff  bilaterally.   I have explained to the patient  that the options are conservative  treatment including an attempt at tobacco cessation and a structured  walking program and the potential use of cilostazol versus surgery.  Based on the CT, I do not think is a candidate for endovascular approach  to his iliac artery occlusions.  Given his age I think if 60 left  proceed with surgery, I would recommend aortofemoral bypass grafting.  He feels his symptoms are quite disabling and he is unable to function  at work and would like to consider revascularization, would like to  discuss this further with his family.  We have discussed the indications  for surgery and potential complications including but not limited to  bleeding, wound healing problems, graft infection, MI, renal  insufficiency or other unpredictable medical problems.  All questions  were answered.  If he does elect to proceed with surgery, will try to  get his Cardiolite report from the [place] office and could tentatively  schedule his surgery.  The CT angio shows that he appears to have good  runoff distally.  I will wait to hear from him.  I have explained I  would be happy to see him back to discuss this further if he would like.   Di Kindle. Edilia Bo, M.D.  Electronically Signed  CSD/MEDQ  D:  05/29/2008  T:  05/30/2008  Job:  [id number]   cc:   Lyn Records, M.D.  Ernestina Penna, M.D.

## 2010-11-11 NOTE — H&P (Signed)
HISTORY AND PHYSICAL EXAMINATION   March 21, 2009   Re:  [name], [name]                DOB:  1951-04-06   REASON FOR ADMISSION:  Aortoiliac occlusive disease with disabling  claudication.   HISTORY:  This is a pleasant 60 year old gentleman who I have been  following with aortoiliac occlusive disease.  His most recent CT  angiogram was on 02/19/2009 and this showed a right common iliac artery  occlusion and external iliac artery occlusion.  On the left side he had  an external iliac artery occlusion.  His femoral arteries and below were  patent and he was being considered for aortofemoral bypass grafting.  He  has tried conservative treatment and we have had multiple discussions  about the importance of tobacco cessation.  However, his symptoms have  progressed and he wished to pursue revascularization.  It was felt that  his best option was aortofemoral bypass grafting.   PAST MEDICAL HISTORY:  Significant for hypercholesterolemia.  He denies  any history of diabetes, hypertension, history of previous myocardial  infarction, history of congestive heart failure or history of COPD.   FAMILY HISTORY:  He had two brothers who had heart problems in their  late 35s.  His father also had coronary artery disease.   SOCIAL HISTORY:  He is married and has three children.  He smoked a pack  per day of cigarettes for over 40 years and has recently tried to cut  back but is continuing to smoke.  It is just under one pack per day.   ALLERGIES:  No known drug allergies.   MEDICATIONS:  1. Fish oil 1200 mg a day.  2. Niaspan 500 mg p.o. daily.  3. Multivitamin one p.o. daily.  4. Trilipix daily.   REVIEW OF SYSTEMS:  GENERAL:  He has had no recent weight loss, weight  gain or problem with his appetite.  CARDIAC:  He has had no chest pain, chest pressure, palpitations or  arrhythmias.  He does admit to some dyspnea on exertion.  PULMONARY:  He has had no recent  productive cough, bronchitis, asthma or  wheezing.  GI:  He has had no recent change in his bowel habits and has no history  of peptic ulcer disease.  GU:  He has had no dysuria or frequency.  VASCULAR:  He has significant claudication bilaterally.  He also has  some early rest pain.  Neuro, ortho, psychiatric, ENT and hematology review of systems are  unremarkable.   PHYSICAL EXAMINATION:  General:  This is a pleasant 61 year old  gentleman who appears his stated age.  Vital signs:  Blood pressure is  153/83, heart rate is 64.  HEENT:  Unremarkable.  Neck:  Supple.  There  is no cervical lymphadenopathy.  I do not detect any carotid bruits.  Lungs:  Are clear bilaterally to auscultation.  Cardiac:  He has a  regular rate and rhythm.  Abdomen:  Soft and nontender.  Normal pitched  bowel sounds.  I cannot palpate femoral, popliteal or pedal pulses on  either side.  He has monophasic Doppler signals in both feet with no  ischemic ulcers.  He has no significant lower extremity swelling.  Neurologic:  Exam is nonfocal.   The patient is admitted now for elective aortobifemoral bypass grafting.  We have discussed the indications for surgery and the potential  complications including but not limited to bleeding, wound healing  problems, graft thrombosis,  graft infection, MI or other unpredictable  medical problems.  All of his questions were answered.  He is agreeable  to proceed.  His surgery has been scheduled for October 5.  Of note, we  will likely try to do an end-to-side proximal anastomosis given the  bilateral external iliac artery occlusions.  In addition, we will obtain  a note from Dr. Michaelle Copas office for cardiac clearance and if he needs any  further cardiac workup certainly we would delay his surgery.   Di Kindle. Edilia Bo, M.D.  Electronically Signed   CSD/MEDQ  D:  03/21/2009  T:  03/22/2009  Job:  [id number]   cc:   Ernestina Penna, M.D.  Lyn Records, M.D.

## 2010-11-11 NOTE — Assessment & Plan Note (Signed)
OFFICE VISIT   ANNIE, ROSEBOOM  DOB:  [birth year]                                       02/12/2009  UEAVW#:[id number]   I saw the patient in the office today for continued followup of his  bilateral iliac artery occlusions.  I had seen him in December of 2009  in consultation with this problem.  He had a long history of bilateral  thigh and calf claudication which he had had for years.  His symptoms  have been gradually progressing.  He had a CT angio at that time which  showed that his infrarenal aorta was patent.  The right common iliac  artery was occluded at its origin.  There was reconstitution of the  common femoral artery on the right.  On the left side the left external  iliac artery was occluded with reconstitution of the left common femoral  artery.  Both superficial femoral arteries at that time appeared patent  and he had three vessel runoff bilaterally.  We discussed conservative  treatment with a structured walking program and tobacco cessation versus  aortofemoral bypass grafting at that time and he was considering his  options.   Since I saw the patient last, the patient had been experiencing some  intermittent chest pressure associated with radiation to his jaw and he  underwent cardiac catheterization on 01/25/2009 by Dr. Katrinka Blazing.  This  demonstrated moderate proximal LAD disease with a 70% stenosis in the  apical LAD with normal left ventricular function.  The recommendation  was for aggressive risk factor modification but no other intervention  was necessary.  Of note, he did require repair of the right brachial  artery by Dr. Madilyn Fireman after the procedure as his cath was performed via a  right brachial artery approach.   He comes in today to discuss his claudication.  He says that his  symptoms have gradually progressed over the last several months.  He  experiences claudication in his hips, thighs and calves associated with  ambulation and  relieved with rest that occurs at a fairly short  distance.  He also has rest pain in both feet.  He has had no history of  nonhealing ulcers.  His symptoms are really prohibiting/keeping him from  performing his duties at work as he has to do a fair amount of walking  and lifting.   PAST MEDICAL HISTORY:  Is significant for hypercholesterolemia and a  history of significant tobacco abuse.  He continues to smoke a pack per  day of cigarettes.   REVIEW OF SYSTEMS:  He has had no further chest pain since his cardiac  catheterization.  He denies any significant dyspnea on exertion.  He has  had no recent change in bowel habits.   PHYSICAL EXAMINATION:  General:  This is a pleasant 60 year old  gentleman who appears his stated age.  Vital signs:  His blood pressure  is 139/88, heart rate is 75.  Neck:  I do not detect any carotid bruits.  Lungs:  Are clear bilaterally to auscultation.  Cardiac:  He has a  regular rate and rhythm.  Abdomen:  Soft and nontender.  I cannot  palpate femoral, popliteal or pedal pulses on either side.  He has no  ischemic ulcers.   Given that he has had significant progression of his symptoms he would  like  to proceed with aortofemoral bypass grafting.  I will discuss him  with Dr. Katrinka Blazing to be sure that it is safe from a cardiac standpoint  although the patient states that he has been told that it is safe to  proceed with surgery.  I would like to repeat his CT angio as it has  been 9 months and his symptoms have progressed significantly and I want  to be sure that his anatomy has not changed significantly.  We will also  obtain baseline ABIs when he comes back after his CT angio.  We have  discussed the indications for surgery and the potential complications  including but not limited to bleeding, wound healing problems, graft  thrombosis, graft infection, MI or other unpredictable medical problems.  All of his questions were answered and he is agreeable to  proceed.   Di Kindle. Edilia Bo, M.D.  Electronically Signed   CSD/MEDQ  D:  02/12/2009  T:  02/13/2009  Job:  [id number]   cc:   Lyn Records, M.D.  Ernestina Penna, M.D.

## 2010-11-11 NOTE — Cardiovascular Report (Signed)
NAMEFIRMAN, PETROW NO.:  [id number]   MEDICAL RECORD NO.:  [id number]          PATIENT TYPE:  OIB   LOCATION:  1967                         FACILITY:  [facility]   PHYSICIAN:  Lyn Records, M.D.   DATE OF BIRTH:  [birth year]   DATE OF PROCEDURE:  DATE OF DISCHARGE:  01/25/2009                            CARDIAC CATHETERIZATION   INDICATION:  Recurring episodes of spontaneous chest pressure with  radiation into the neck.  Limiting claudication.  The patient is known  to have a totally occluded distal aorta with absent femoral pulses  bilaterally.   PROCEDURES PERFORMED:  1. Left heart catheterization via the right brachial percutaneous      approach.  2. Left ventriculography.  3. Coronary artery angiography.   DESCRIPTION:  After informed consent, a 5-French sheath was placed in  the right brachial artery using the modified Seldinger technique.Two  thousand units of heparin was administered thru the arterial sheath  immediately after obtaining access. An 5-French A2 multipurpose catheter  was then used to perform right coronary angiography and left  ventriculography.  We used a 5-French KIMI catheter to perform left  coronary angiography.  Hemostasis was achieved by manual compression. No  immediate complications were noted.   RESULTS:  1. Hemodynamic data:      a.     Aortic pressure 123/70 mmHg.      b.     Left ventricular pressure 138/12 mmHg (the aortic pressure       on pullback was 138/72).  2. Left ventriculography:  Left ventricular cavity size and systolic      function are normal.  No mitral regurgitation is noted.  3. Coronary angiography.      a.     Left main coronary:  Normal.      b.     Left anterior descending coronary:  LAD contains proximal       luminal irregularities.  The first septal perforator contains 30%       narrowing at its ostium.  The first diagonal contains ostial 70%       narrowing.  The LAD after the first septal  perforator contains an       eccentric 50%-60% narrowing.  Two moderate-size diagonals arise       beyond this point and are free of any significant obstruction.       The LAD at the apex contains an eccentric 70% narrowing.      c.     Circumflex artery:  The circumflex coronary artery is small       and gives origin to one small obtuse marginal branch.      d.     Ramus intermedius:  The ramus intermedius is a large vessel       with luminal irregularities that trifurcates on the left lateral       wall.      e.     Right coronary:  The right coronary artery contains       midvessel 30% narrowing.  PDA and left ventricular branches are  normal.   CONCLUSIONS:  1. Moderate proximal LAD disease.  There is 70% stenosis in the apical      LAD.  2. Normal left ventricular function.   RECOMMENDATIONS:  Aggressive risk factor modification.  Cigarette  smoking cessation.  Clinical follow up in 1 week.  Consider moving  forward with aortobifemoral reconstruction.      Lyn Records, M.D.  Electronically Signed     Lyn Records, M.D.  Electronically Signed    HWS/MEDQ  D:  01/25/2009  T:  01/26/2009  Job:  [id number]   cc:   Di Kindle. Edilia Bo, M.D.  Ernestina Penna, M.D.

## 2010-11-14 NOTE — Progress Notes (Signed)
[facility]                        PERIPHERAL VASCULAR OFFICE NOTE   KALLEN, MCCRYSTAL                       MRN:          [id number]  DATE:04/03/2008                            DOB:          1950-08-22    PRIMARY CARE PHYSICIAN:  Belva Agee, NP   PRIMARY CARDIOLOGIST:  Rollene Rotunda, MD, [facility]   REASON FOR CONSULTATION:  Lower extremity PAD with intermittent  claudication.   HISTORY OF PRESENT ILLNESS:  Mr. Maffei is a 60 year old gentleman with  recently diagnosed peripheral arterial disease.  He actually has  longstanding symptoms of leg pain.  He describes bilateral hip, thigh  and calf pain with walking.  He does not have rest pain.  Symptoms have  been present for nearly 10 years.  He has had slow progression of  symptoms but no major changes over the last several years.  He has to  stop and rest even with fairly low-level walking.  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  He was noted to have an abnormal  pulse exam by Dr. Antoine Poche and underwent lower extremity arterial duplex  scan that demonstrated ABIs of 0.49 on the right, 0.60 on the left.  There was no significant plaque formation in the lower extremities.  All  waveforms were monophasic suggesting inflow disease.  There was no  infrainguinal stenosis seen.   PAST MEDICAL HISTORY:  1. Hyperlipidemia.  2. Longstanding tobacco abuse.  3. Vasectomy, no other surgeries or medical problems noted.   MEDICATIONS:  1. Multivitamin 1 daily.  2. Fish oil.  3. Niacin.  4. Red yeast rice   ALLERGIES:  NKDA.   SOCIAL HISTORY:  The patient works as a Naval architect.  He is married and  has 3 children.  He is a long-time smoker for over 40 years.  Currently  smoking over one pack daily.  He drinks alcohol just on a social basis.   FAMILY HISTORY:  There is a strong family history of early CAD.  His  father died at age 92 of myocardial infarction, brother died at age  66,  another brother had a myocardial infarction at age 84, but is still  alive.   REVIEW OF SYSTEMS:  12-point review of systems was completed.  Pertinent  positives included right arm pain secondary to cervical disk problems  with associated numbness also with chest pain.  He recently was  evaluated with a Myoview stress scan on March 12, 2008, that was  negative.   All other systems were reviewed and are negative except as detailed  above.   PHYSICAL EXAMINATION:  GENERAL:  The patient is alert and oriented.  He  is in no acute distress.  VITAL SIGNS:  Weight is 192 pounds, blood pressure 120/78, heart rate  63, respiratory rate 16.  HEENT:  Normal.  NECK:  Normal carotid upstrokes, no bruits.  JVP normal.  No thyromegaly  or thyroid nodules.  LUNGS:  Clear bilaterally.  HEART:  Regular rate and rhythm.  The apex is discrete nondisplaced.  There are no murmurs or gallops.  ABDOMEN:  Soft, nontender, no organomegaly.  No abdominal bruits.  BACK:  No CVA tenderness.  EXTREMITIES:  No clubbing, cyanosis or edema.  Femoral pulses are  absent.  Dorsalis pedis and posterior tibial pulses are absent.  SKIN:  Warm and dry with no rash.  There are no ulcerations.  NEUROLOGIC:  Cranial nerves II-XII are intact.  Strength intact and  equal   ASSESSMENT:  This is a 60 year old gentleman with objective findings of  the severe lower extremity peripheral arterial disease.  His symptoms,  physical exam and noninvasive studies all are suggestive of severe  aortoiliac stenosis or occlusion.  The patient had no evidence of  infrainguinal disease based on his duplex scanning.  Nothing there is a  strong possibility that he has Leriche syndrome with distal abdominal  aortic occlusion.  The other possibility is that he has severe bilateral  common iliac stenosis or occlusions.  I think the best way to evaluate  his anatomy is with the CT angio.  This can provide excellent anatomic   assessment of proximal aortoiliac region as well as identify his  collateral flow to his legs.  He has no evidence of critical limb  ischemia.  However, I think his symptoms warrant an aggressive  treatment.  We we can define whether he is a candidate for endovascular  treatment after his CT scan is performed.  Obviously, if he has aortic  occlusion, he will require aortobifemoral bypass.  Further  recommendations based on the result of his CT scan.  Long discussion  today regarding tobacco cessation as well as other secondary risk  reduction measures.  His goal LDL in the setting of severe PAD is less  than 100 and ideally less than 70.     Veverly Fells. Excell Seltzer, MD  Electronically Signed    MDC/MedQ  DD: 04/09/2008  DT: 04/09/2008  Job #: [id number]   cc:   Rollene Rotunda, MD, [facility]  Belva Agee, NP

## 2011-11-28 HISTORY — PX: CORONARY ANGIOPLASTY WITH STENT PLACEMENT: SHX49

## 2011-12-17 ENCOUNTER — Encounter (HOSPITAL_COMMUNITY)
Admission: EM | Disposition: A | Discharge status: Home / Self Care | Payer: Self-pay | Source: Home / Self Care | Attending: Cardiology

## 2011-12-17 ENCOUNTER — Telehealth: Payer: Self-pay | Admitting: Cardiology

## 2011-12-17 ENCOUNTER — Emergency Department (HOSPITAL_COMMUNITY): Payer: Medicare HMO

## 2011-12-17 ENCOUNTER — Inpatient Hospital Stay (HOSPITAL_COMMUNITY): Payer: Medicare HMO

## 2011-12-17 ENCOUNTER — Inpatient Hospital Stay (HOSPITAL_COMMUNITY)
Admission: EM | Admit: 2011-12-17 | Discharge: 2011-12-18 | DRG: 247 | Disposition: A | Discharge status: Home / Self Care | Payer: Medicare HMO | Attending: Cardiology | Admitting: Cardiology

## 2011-12-17 ENCOUNTER — Encounter (HOSPITAL_COMMUNITY): Payer: Self-pay | Admitting: *Deleted

## 2011-12-17 ENCOUNTER — Other Ambulatory Visit: Payer: Self-pay

## 2011-12-17 DIAGNOSIS — Z8249 Family history of ischemic heart disease and other diseases of the circulatory system: Secondary | ICD-10-CM

## 2011-12-17 DIAGNOSIS — R972 Elevated prostate specific antigen [PSA]: Secondary | ICD-10-CM | POA: Diagnosis present

## 2011-12-17 DIAGNOSIS — Z951 Presence of aortocoronary bypass graft: Secondary | ICD-10-CM

## 2011-12-17 DIAGNOSIS — E785 Hyperlipidemia, unspecified: Secondary | ICD-10-CM | POA: Diagnosis present

## 2011-12-17 DIAGNOSIS — Z7902 Long term (current) use of antithrombotics/antiplatelets: Secondary | ICD-10-CM

## 2011-12-17 DIAGNOSIS — I679 Cerebrovascular disease, unspecified: Secondary | ICD-10-CM | POA: Diagnosis present

## 2011-12-17 DIAGNOSIS — Z7982 Long term (current) use of aspirin: Secondary | ICD-10-CM

## 2011-12-17 DIAGNOSIS — I2510 Atherosclerotic heart disease of native coronary artery without angina pectoris: Secondary | ICD-10-CM | POA: Diagnosis present

## 2011-12-17 DIAGNOSIS — Z888 Allergy status to other drugs, medicaments and biological substances status: Secondary | ICD-10-CM

## 2011-12-17 DIAGNOSIS — I70209 Unspecified atherosclerosis of native arteries of extremities, unspecified extremity: Secondary | ICD-10-CM | POA: Diagnosis present

## 2011-12-17 DIAGNOSIS — Z79899 Other long term (current) drug therapy: Secondary | ICD-10-CM

## 2011-12-17 DIAGNOSIS — Z720 Tobacco use: Secondary | ICD-10-CM | POA: Diagnosis present

## 2011-12-17 DIAGNOSIS — E7800 Pure hypercholesterolemia, unspecified: Secondary | ICD-10-CM | POA: Diagnosis present

## 2011-12-17 DIAGNOSIS — F17200 Nicotine dependence, unspecified, uncomplicated: Secondary | ICD-10-CM | POA: Diagnosis present

## 2011-12-17 DIAGNOSIS — I200 Unstable angina: Secondary | ICD-10-CM

## 2011-12-17 DIAGNOSIS — Z955 Presence of coronary angioplasty implant and graft: Secondary | ICD-10-CM

## 2011-12-17 DIAGNOSIS — Z8673 Personal history of transient ischemic attack (TIA), and cerebral infarction without residual deficits: Secondary | ICD-10-CM

## 2011-12-17 HISTORY — DX: Elevated prostate specific antigen (PSA): R97.20

## 2011-12-17 HISTORY — DX: Cerebral infarction, unspecified: I63.9

## 2011-12-17 HISTORY — PX: LEFT HEART CATHETERIZATION WITH CORONARY ANGIOGRAM: SHX5451

## 2011-12-17 HISTORY — DX: Atherosclerotic heart disease of native coronary artery without angina pectoris: I25.10

## 2011-12-17 HISTORY — DX: Tobacco use: Z72.0

## 2011-12-17 HISTORY — PX: PERCUTANEOUS CORONARY STENT INTERVENTION (PCI-S): SHX5485

## 2011-12-17 HISTORY — DX: Peripheral vascular disease, unspecified: I73.9

## 2011-12-17 HISTORY — DX: Hyperlipidemia, unspecified: E78.5

## 2011-12-17 LAB — CBC
HCT: 41.5 % (ref 39.0–52.0)
RDW: 13.5 % (ref 11.5–15.5)
WBC: 6.5 10*3/uL (ref 4.0–10.5)

## 2011-12-17 LAB — POCT I-STAT TROPONIN I: Troponin i, poc: 0 ng/mL (ref 0.00–0.08)

## 2011-12-17 LAB — PROTIME-INR: INR: 1.02 (ref 0.00–1.49)

## 2011-12-17 LAB — CARDIAC PANEL(CRET KIN+CKTOT+MB+TROPI)
CK, MB: 1.5 ng/mL (ref 0.3–4.0)
Relative Index: INVALID (ref 0.0–2.5)
Relative Index: INVALID (ref 0.0–2.5)
Total CK: 73 U/L (ref 7–232)
Troponin I: <0.3 ng/mL (ref <0.30)

## 2011-12-17 LAB — MRSA PCR SCREENING: MRSA by PCR: NEGATIVE

## 2011-12-17 LAB — DIFFERENTIAL
Basophils Absolute: 0 10*3/uL (ref 0.0–0.1)
Lymphocytes Relative: 39 % (ref 12–46)
Monocytes Absolute: 0.6 10*3/uL (ref 0.1–1.0)
Neutro Abs: 3.2 10*3/uL (ref 1.7–7.7)

## 2011-12-17 LAB — POCT I-STAT, CHEM 8
HCT: 42 % (ref 39.0–52.0)
Hemoglobin: 14.3 g/dL (ref 13.0–17.0)
Sodium: 142 mEq/L (ref 135–145)
TCO2: 20 mmol/L (ref 0–100)

## 2011-12-17 LAB — POCT ACTIVATED CLOTTING TIME: Activated Clotting Time: 139 seconds

## 2011-12-17 LAB — APTT: aPTT: 29 seconds (ref 24–37)

## 2011-12-17 SURGERY — LEFT HEART CATHETERIZATION WITH CORONARY ANGIOGRAM
Anesthesia: LOCAL

## 2011-12-17 SURGERY — PERCUTANEOUS CORONARY STENT INTERVENTION (PCI-S)
Anesthesia: LOCAL

## 2011-12-17 MED ORDER — NITROGLYCERIN IN D5W 200-5 MCG/ML-% IV SOLN
5.0000 ug/min | Freq: Once | INTRAVENOUS | Status: AC
  Administered 2011-12-17: 5 ug/min via INTRAVENOUS
  Filled 2011-12-17: qty 250

## 2011-12-17 MED ORDER — FOLIC ACID 1 MG PO TABS
400.0000 ug | ORAL_TABLET | Freq: Every day | ORAL | Status: DC
  Filled 2011-12-17 (×2): qty 0.5

## 2011-12-17 MED ORDER — ACETAMINOPHEN 325 MG PO TABS: 650.0000 mg | ORAL_TABLET | ORAL | Status: DC | PRN

## 2011-12-17 MED ORDER — HEPARIN (PORCINE) IN NACL 100-0.45 UNIT/ML-% IJ SOLN
12.0000 [IU]/kg/h | INTRAMUSCULAR | Status: DC
  Administered 2011-12-17: 12 [IU]/kg/h via INTRAVENOUS
  Filled 2011-12-17: qty 250

## 2011-12-17 MED ORDER — CLOPIDOGREL BISULFATE 75 MG PO TABS
ORAL_TABLET | ORAL | Status: AC
  Filled 2011-12-17: qty 2

## 2011-12-17 MED ORDER — ASPIRIN EC 325 MG PO TBEC
325.0000 mg | DELAYED_RELEASE_TABLET | Freq: Every day | ORAL | Status: DC
  Administered 2011-12-17: 325 mg via ORAL
  Filled 2011-12-17: qty 1

## 2011-12-17 MED ORDER — NIACIN ER 500 MG PO TBCR
1000.0000 mg | EXTENDED_RELEASE_TABLET | Freq: Every day | ORAL | Status: DC
  Administered 2011-12-17: 1000 mg via ORAL
  Filled 2011-12-17 (×2): qty 2

## 2011-12-17 MED ORDER — NITROGLYCERIN 0.4 MG SL SUBL: 0.4000 mg | SUBLINGUAL_TABLET | SUBLINGUAL | Status: DC | PRN

## 2011-12-17 MED ORDER — LIDOCAINE HCL (PF) 1 % IJ SOLN
INTRAMUSCULAR | Status: AC
  Filled 2011-12-17: qty 60

## 2011-12-17 MED ORDER — BIVALIRUDIN 250 MG IV SOLR
INTRAVENOUS | Status: AC
  Filled 2011-12-17: qty 250

## 2011-12-17 MED ORDER — HEPARIN SODIUM (PORCINE) 1000 UNIT/ML IJ SOLN
INTRAMUSCULAR | Status: AC
  Filled 2011-12-17: qty 1

## 2011-12-17 MED ORDER — SODIUM CHLORIDE 0.9 % IV SOLN
0.2500 mg/kg/h | Freq: Once | INTRAVENOUS | Status: DC
  Filled 2011-12-17: qty 250

## 2011-12-17 MED ORDER — NITROGLYCERIN 0.2 MG/ML ON CALL CATH LAB
INTRAVENOUS | Status: AC
  Filled 2011-12-17: qty 1

## 2011-12-17 MED ORDER — FENTANYL CITRATE 0.05 MG/ML IJ SOLN
INTRAMUSCULAR | Status: AC
  Filled 2011-12-17: qty 2

## 2011-12-17 MED ORDER — HEPARIN BOLUS VIA INFUSION
4000.0000 [IU] | Freq: Once | INTRAVENOUS | Status: AC
  Administered 2011-12-17: 4000 [IU] via INTRAVENOUS

## 2011-12-17 MED ORDER — NITROGLYCERIN IN D5W 200-5 MCG/ML-% IV SOLN
2.0000 ug/min | INTRAVENOUS | Status: DC
  Administered 2011-12-17: 10 ug/min via INTRAVENOUS

## 2011-12-17 MED ORDER — SODIUM CHLORIDE 0.9 % IV SOLN: 250.0000 mL | INTRAVENOUS | Status: DC | PRN

## 2011-12-17 MED ORDER — MIDAZOLAM HCL 2 MG/2ML IJ SOLN
INTRAMUSCULAR | Status: AC
  Filled 2011-12-17: qty 2

## 2011-12-17 MED ORDER — ONDANSETRON HCL 4 MG/2ML IJ SOLN: 4.0000 mg | Freq: Four times a day (QID) | INTRAMUSCULAR | Status: DC | PRN

## 2011-12-17 MED ORDER — ZOLPIDEM TARTRATE 5 MG PO TABS: 10.0000 mg | ORAL_TABLET | Freq: Every evening | ORAL | Status: DC | PRN

## 2011-12-17 MED ORDER — HEPARIN (PORCINE) IN NACL 2-0.9 UNIT/ML-% IJ SOLN
INTRAMUSCULAR | Status: AC
  Filled 2011-12-17: qty 2000

## 2011-12-17 MED ORDER — SODIUM CHLORIDE 0.9 % IJ SOLN: 3.0000 mL | INTRAMUSCULAR | Status: DC | PRN

## 2011-12-17 MED ORDER — NIACIN ER 500 MG PO TBCR
1000.0000 mg | EXTENDED_RELEASE_TABLET | Freq: Every evening | ORAL | Status: DC | PRN
  Filled 2011-12-17 (×2): qty 2

## 2011-12-17 MED ORDER — BIVALIRUDIN BOLUS VIA INFUSION
0.1000 mg/kg | Freq: Once | INTRAVENOUS | Status: DC
  Filled 2011-12-17: qty 10

## 2011-12-17 MED ORDER — HEPARIN (PORCINE) IN NACL 100-0.45 UNIT/ML-% IJ SOLN
1200.0000 [IU]/h | INTRAMUSCULAR | Status: DC
  Administered 2011-12-17: 1200 [IU]/h via INTRAVENOUS
  Filled 2011-12-17: qty 250

## 2011-12-17 MED ORDER — ASPIRIN 81 MG PO CHEW: 81.0000 mg | CHEWABLE_TABLET | Freq: Every day | ORAL | Status: DC

## 2011-12-17 MED ORDER — SODIUM CHLORIDE 0.9 % IV SOLN: INTRAVENOUS | Status: AC

## 2011-12-17 MED ORDER — CLOPIDOGREL BISULFATE 75 MG PO TABS
75.0000 mg | ORAL_TABLET | Freq: Every day | ORAL | Status: DC
  Administered 2011-12-17: 75 mg via ORAL
  Filled 2011-12-17 (×2): qty 1

## 2011-12-17 MED ORDER — DIAZEPAM 5 MG PO TABS
5.0000 mg | ORAL_TABLET | ORAL | Status: AC
  Administered 2011-12-17: 5 mg via ORAL
  Filled 2011-12-17: qty 1

## 2011-12-17 MED ORDER — SODIUM CHLORIDE 0.9 % IV SOLN
INTRAVENOUS | Status: DC
  Administered 2011-12-17: 75 mL/h via INTRAVENOUS

## 2011-12-17 MED ORDER — SODIUM CHLORIDE 0.9 % IJ SOLN: 3.0000 mL | Freq: Two times a day (BID) | INTRAMUSCULAR | Status: DC

## 2011-12-17 MED ORDER — METOPROLOL TARTRATE 12.5 MG HALF TABLET
12.5000 mg | ORAL_TABLET | Freq: Two times a day (BID) | ORAL | Status: DC
  Administered 2011-12-17: 12.5 mg via ORAL
  Filled 2011-12-17 (×4): qty 1

## 2011-12-17 MED ORDER — SODIUM CHLORIDE 0.9 % IV SOLN: INTRAVENOUS | Status: DC

## 2011-12-17 MED ORDER — ASPIRIN 81 MG PO CHEW
324.0000 mg | CHEWABLE_TABLET | Freq: Once | ORAL | Status: AC
  Administered 2011-12-17: 324 mg via ORAL
  Filled 2011-12-17: qty 4

## 2011-12-17 NOTE — CV Procedure (Signed)
  Cardiac Catheterization Procedure Note  Name: [name] MRN: [id number] DOB: 06/14/1951  Procedure: Left Heart Cath, Selective Coronary Angiography, LV angiography  Indication:  Unstable angina  Procedural details: The left groin was prepped, draped, and anesthetized with 1% lidocaine. Using modified Seldinger technique, a 5 French sheath was introduced into the left femoral artery. Standard Judkins catheters were used for coronary angiography and left ventriculography. Catheter exchanges were performed over a guidewire. There were no immediate procedural complications. The patient was transferred to the post catheterization recovery area for further monitoring.  Of note I did attempt to do the procedure via the right femoral artery.  I was able to cannulate the vessel.  However, because of his previous surgery I was unable to get a guidewire up the right side.  I did not attempt to place the sheath.  Procedural Findings:   Hemodynamics:     AO 120/67     LV 136/8   Coronary angiography:   Coronary dominance: Right  Left mainstem:   LM normal  Left anterior descending (LAD):   Proximal diffuse 25% stenosis.  Proximal focal 80%.  D1 moderate with luminal irregularities.  D2 small normal.  Left circumflex (LCx):  AV groove with luminal irregularities.  Small.  OM small to moderate with proximal 50%.  Ramus Intermedius:   Large and branching with luminal irregularities.    Right coronary artery (RCA):  Diffuse luminal irregularities.  Long mid 50%.  Distal diffuse 25%.  PDA moderate with luminal irregularities.  PL small normal  Left ventriculography: Left ventricular systolic function is normal, LVEF is estimated at 65%, there is no significant mitral regurgitation   Final Conclusions:  Severe single vessel CAD with preserved EF.  Recommendations: Plan PCI of LAD  Rollene Rotunda 12/17/2011, 3:49 PM

## 2011-12-17 NOTE — Progress Notes (Signed)
Pt smokes 1 1/4 ppd and says he wants to quit and is in action stage. He also quit for 3 months before on patches. Recommended 21 mg patch to start with. Discussed patch use instructions and how to taper. Pt voices understanding. Referred to 1-800 quit now for f/u and support. Discussed oral fixation substitutes, second hand smoke and in home smoking policy. Reviewed and gave pt Written education/contact information.

## 2011-12-17 NOTE — Progress Notes (Signed)
Patient Name: [name] Date of Encounter: 12/17/2011    SUBJECTIVE: He presented last night/early this AM with chest discomfort compatible with angina. I cathed him in 2010 and it was complicated by right brachial occlusion requiring surgical thrombectomy. He has been lost to follow-up since that time. He had successful aorto-bifemoral bypass in 2010 after the cath was performed. He still smokes heavily.  TELEMETRY:  NSR: Filed Vitals:   12/17/11 0600 12/17/11 0700 12/17/11 0739 12/17/11 0800  BP: 112/63 107/56  113/66  Pulse: 63 54  55  Temp:   97.7 F (36.5 C)   TempSrc:   Oral   Resp: 17 13  14   Height:      Weight:      SpO2: 98% 97%  96%    Intake/Output Summary (Last 24 hours) at 12/17/11 0906 Last data filed at 12/17/11 0800  Gross per 24 hour  Intake    356 ml  Output      0 ml  Net    356 ml    LABS: Basic Metabolic Panel:  Basename 12/17/11 0251  NA 142  K 3.6  CL 108  CO2 --  GLUCOSE 92  BUN 15  CREATININE 1.00  CALCIUM --  MG --  PHOS --   CBC:  Basename 12/17/11 0251 12/17/11 0237  WBC -- 6.5  NEUTROABS -- 3.2  HGB 14.3 14.9  HCT 42.0 41.5  MCV -- 87.2  PLT -- 195   Radiology/Studies:  CXR 12/17/11 IMPRESSION:  Minimal left basilar airspace opacity likely reflects atelectasis.  However, given mildly increased left-sided lung markings, a mild  atypical pneumonia could conceivably have this appearance.  Original Report Authenticated By: Tonia Ghent, M.D.   Physical Exam: Blood pressure 113/66, pulse 55, temperature 97.7 F (36.5 C), temperature source Oral, resp. rate 14, height 5\' 9"  (1.753 m), weight 90.4 kg (199 lb 4.7 oz), SpO2 96.00%. Weight change:    2+ Brachial and radial pulses. No carotid bruis. Bilat evidence of prior surgiical procedure. 1+ left femoral and 1-2+ right femoral. Bilat bruits Chest with rhonchi bilat. S4 gallop with no murmur.  ASSESSMENT:  1. ACS, unstable angina 2. PVD with prior aorto-bifem  bypass 3. Tobacco abuse continues 4. CVA 2011 5. Elevate PSA   Plan:  1. Needs cath. Discussed the procedure including risk of stroke, death, MI, vessel occlusion, kidney injury, bleeding limb ischemia among others. I believe he is higher than usual vascular risk due to diffuse disease and continued smoking. He is reluctant and wife wants a "specialist/surgeon" to perform the procedure. He and wife will discuss .  I informed them that I will be happy to help them get whoever they desire to perform the procedure including helping to decide about another cardiologist. Will await their decision.  Selinda Eon 12/17/2011, 9:06 AM

## 2011-12-17 NOTE — H&P (Addendum)
Cardiology H&P  Primary Care Povider: DEFAULT,PROVIDER, MD Primary Cardiologist: Mendel Ryder   HPI: [name] is a 61 y.o.male with significant PVD, prior CVA, and nonobstructive CAD in 2010 who presents to the ED with chest pain that woke him from sleep this AM at 0300.  He describes the pain as squeezing that comes and goes at rest.  He was diaphoretic but denies SOB or nausea.  He has continued to have intermittent pain tin the ED but this has improved on a nitroglycerin ggt.  He is currently pain free.  He has aortobifemoral bypass as described below.  His last LHC in 2010 showed distal LAD disease and moderate proximal to mid LAD disease.  This was performed via the right brachial artery and was complicated by brachial artery occlusion requiring vascular surgery intervention.  He has continued to smoke more than 1 ppd.  He had a CVA in 2011 and has been on plavix alone since that time. He recently was found to have an elevated PSA and is scheduled to see urology.   Past Medical History  Diagnosis Date  . Hyperlipidemia   . PVD (peripheral vascular disease)     Aortobifemoral Bypass 2010, Thrombectomy and repair right brachial artery   . CAD (coronary artery disease) 12/2008    LHC: 50-60% mid LAD, 70% apical LAD, medically managed, complicated by brachial artery occlusion requiring vascular surgery  . CVA (cerebral infarction) 06/2009  . Elevated PSA 11/2011    scheduled to see urology    Past Surgical History  Procedure Date  . Aortobifemoral bypass 2010  . Thrombectomy and brachial artery repair     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    Family History  Problem Relation Age of Onset  . Coronary artery disease Father 67  . Coronary artery disease Brother 75    died with AMI    Social History:  reports that he has been smoking Cigarettes.  He has been smoking about 1.5 packs per day. He does not have any smokeless tobacco history on file. He reports that  he drinks alcohol. His drug history not on file.  Allergies:  Allergies  Allergen Reactions  . Statins     Current Facility-Administered Medications  Medication Dose Route Frequency Provider Last Rate Last Dose  . aspirin chewable tablet 324 mg  324 mg Oral Once Vida Roller, MD   324 mg at 12/17/11 0258  . heparin ADULT infusion 100 units/mL (25000 units/250 mL)  12 Units/kg/hr Intravenous Continuous Vida Roller, MD 11 mL/hr at 12/17/11 0323 12 Units/kg/hr at 12/17/11 0323  . heparin bolus via infusion 4,000 Units  4,000 Units Intravenous Once Vida Roller, MD   4,000 Units at 12/17/11 0323  . nitroGLYCERIN 0.2 mg/mL in dextrose 5 % infusion  5-200 mcg/min Intravenous Once Vida Roller, MD 3 mL/hr at 12/17/11 0410 10 mcg/min at 12/17/11 0410   Current Outpatient Prescriptions  Medication Sig Dispense Refill  . clopidogrel (PLAVIX) 75 MG tablet Take 75 mg by mouth daily.      . folic acid (FOLVITE) 400 MCG tablet Take 400 mcg by mouth daily.      . niacin (SLO-NIACIN) 500 MG tablet Take 1,000 mg by mouth at bedtime and may repeat dose one time if needed.         ROS: A full review of systems is obtained and is negative except as noted in the HPI.  Physical Exam: Blood pressure 117/68, pulse  58, temperature 98.3 F (36.8 C), temperature source Oral, resp. rate 16, height 5\' 9"  (1.753 m), weight 90.719 kg (200 lb), SpO2 96.00%.  GENERAL: no acute distress.  EYES: Extra ocular movements are intact. There is no lid lag. Sclera is anicteric.  ENT: Oropharynx is clear. Dentition is within normal limits.  NECK: Supple. The thyroid is not enlarged.  LYMPH: There are no masses or lymphadenopathy present.  HEART: Regular rate and rhythm with no m/g/r.  Normal S1/S2. No JVD LUNGS: Clear to auscultation There are no rales, rhonchi, or wheezes.  ABDOMEN: Soft, non-tender, and non-distended with normoactive bowel sounds. There is no hepatosplenomegaly.  EXTREMITIES: No clubbing,  cyanosis, or edema.  PULSES: Carotids were +2 and equal bilaterally with no bruits. Femoral pulses were +2 and equal bilaterally. DP/PT pulses were +2 and equal bilaterally. Radial pulses were 2+ bilaterally SKIN: Warm, dry, and intact.  NEUROLOGIC: The patient was oriented to person, place, and time. No overt neurologic deficits were detected.  PSYCH: Normal judgment and insight, mood is appropriate.   Results: Results for orders placed during the hospital encounter of 12/17/11 (from the past 24 hour(s))  CBC     Status: Normal   Collection Time   12/17/11  2:37 AM      Component Value Range   WBC 6.5  4.0 - 10.5 K/uL   RBC 4.76  4.22 - 5.81 MIL/uL   Hemoglobin 14.9  13.0 - 17.0 g/dL   HCT 40.9  81.1 - 91.4 %   MCV 87.2  78.0 - 100.0 fL   MCH 31.3  26.0 - 34.0 pg   MCHC 35.9  30.0 - 36.0 g/dL   RDW 78.2  95.6 - 21.3 %   Platelets 195  150 - 400 K/uL  DIFFERENTIAL     Status: Normal   Collection Time   12/17/11  2:37 AM      Component Value Range   Neutrophils Relative 48  43 - 77 %   Neutro Abs 3.2  1.7 - 7.7 K/uL   Lymphocytes Relative 39  12 - 46 %   Lymphs Abs 2.6  0.7 - 4.0 K/uL   Monocytes Relative 9  3 - 12 %   Monocytes Absolute 0.6  0.1 - 1.0 K/uL   Eosinophils Relative 3  0 - 5 %   Eosinophils Absolute 0.2  0.0 - 0.7 K/uL   Basophils Relative 0  0 - 1 %   Basophils Absolute 0.0  0.0 - 0.1 K/uL  APTT     Status: Normal   Collection Time   12/17/11  2:37 AM      Component Value Range   aPTT 29  24 - 37 seconds  PROTIME-INR     Status: Normal   Collection Time   12/17/11  2:37 AM      Component Value Range   Prothrombin Time 13.6  11.6 - 15.2 seconds   INR 1.02  0.00 - 1.49  POCT I-STAT TROPONIN I     Status: Normal   Collection Time   12/17/11  2:49 AM      Component Value Range   Troponin i, poc 0.00  0.00 - 0.08 ng/mL   Comment 3           POCT I-STAT, CHEM 8     Status: Normal   Collection Time   12/17/11  2:51 AM      Component Value Range   Sodium 142   135 - 145  mEq/L   Potassium 3.6  3.5 - 5.1 mEq/L   Chloride 108  96 - 112 mEq/L   BUN 15  6 - 23 mg/dL   Creatinine, Ser 1.61  0.50 - 1.35 mg/dL   Glucose, Bld 92  70 - 99 mg/dL   Calcium, Ion 0.96  0.45 - 1.32 mmol/L   TCO2 20  0 - 100 mmol/L   Hemoglobin 14.3  13.0 - 17.0 g/dL   HCT 40.9  81.1 - 91.4 %    EKG: NSR with nonspecific TWA CXR: left sided atelectasis.  Can't rule out atypical pneumonia  Assessment/Plan: 61 yo WM with significant PVD, CVA and CAD here with unstable angina.  LHC in 2010 showed 50-60% mid/prox LAD disease and 70% distal LAD.   1. Unstable angina: currently pain free on nitro ggt - continue plavix and add aspirin - start heparin ggt until ruled out - nitro ggt as needed - will try to wean - follow cardiac enzymes - add low dose metoprolol as tolerated - may plan for [facility] tomorrow, access will need to be considered (right brachial occlusion after last cath requiring thrombectomy/repair and aortobifemoral bypass surgery in 2010) - note that he has upcoming appt with urology for elevated PSA and may be undergoing procedures that would prevent him taking plavix uninterrupted for 1 year  2. Abnormal CXR: suspect atelectasis rather than pneumonia. No leukocytosis - check PA/lateral    Jerald Villalona,Gaylen 12/17/2011, 4:11 AM

## 2011-12-17 NOTE — ED Provider Notes (Signed)
History     CSN: [id number]  Arrival date & time 12/17/11  0212   First MD Initiated Contact with Patient 12/17/11 0224      Chief Complaint  Patient presents with  . Chest Pain    (Consider location/radiation/quality/duration/timing/severity/associated sxs/prior treatment) HPI Comments: [age]-year-old male with a history of coronary artery disease with blockages of 50 and 70% on prior cath, history of vasculopathy with bifemoral bypass grafting for the patient report, has also hypertension and hypercholesterolemia. He presents after he awoke approximately one hour ago with chest pain which was just left of sternal, heavy and squeezing, intermittent and fluctuating pattern and not associated with any other symptoms including radiation, shortness of breath, nausea vomiting or diaphoresis. The symptoms are currently moderate, nothing seems to make them better or worse. He was given aspirin and nitroglycerin prior to arrival, the nitroglycerin improved his symptoms. His cardiologist was Dr. Katrinka Blazing  Patient is a 61 y.o. male presenting with chest pain. The history is provided by the patient, medical records and the EMS personnel.  Chest Pain     Past Medical History  Diagnosis Date  . Hyperlipidemia   . PVD (peripheral vascular disease)     Aortobifemoral Bypass 2010, Thrombectomy and repair right brachial artery   . CAD (coronary artery disease) 12/2008    LHC: 50-60% mid LAD, 70% apical LAD, medically managed  . CVA (cerebral infarction) 06/2009    Past Surgical History  Procedure Date  . Aortobifemoral bypass 2010  . Thrombectomy and brachial artery repair     Family History  Problem Relation Age of Onset  . Coronary artery disease      History  Substance Use Topics  . Smoking status: Current Everyday Smoker  . Smokeless tobacco: Not on file  . Alcohol Use: Yes     ocassionally      Review of Systems  Cardiovascular: Positive for chest pain.  All other systems  reviewed and are negative.    Allergies  Review of patient's allergies indicates no known allergies.  Home Medications   Current Outpatient Rx  Name Route Sig Dispense Refill  . CLOPIDOGREL BISULFATE 75 MG PO TABS Oral Take 75 mg by mouth daily.    Marland Kitchen FOLIC ACID 400 MCG PO TABS Oral Take 400 mcg by mouth daily.    Marland Kitchen NIACIN ER 500 MG PO TBCR Oral Take 1,000 mg by mouth at bedtime and may repeat dose one time if needed.       BP 117/68  Pulse 58  Temp 98.3 F (36.8 C) (Oral)  Resp 16  Ht 5\' 9"  (1.753 m)  Wt 200 lb (90.719 kg)  BMI 29.53 kg/m2  SpO2 96%  Physical Exam  Nursing note and vitals reviewed. Constitutional: He appears well-developed and well-nourished. No distress.  HENT:  Head: Normocephalic and atraumatic.  Mouth/Throat: Oropharynx is clear and moist. No oropharyngeal exudate.  Eyes: Conjunctivae and EOM are normal. Pupils are equal, round, and reactive to light. Right eye exhibits no discharge. Left eye exhibits no discharge. No scleral icterus.  Neck: Normal range of motion. Neck supple. No JVD present. No thyromegaly present.  Cardiovascular: Normal rate, regular rhythm, normal heart sounds and intact distal pulses.  Exam reveals no gallop and no friction rub.   No murmur heard. Pulmonary/Chest: Effort normal and breath sounds normal. No respiratory distress. He has no wheezes. He has no rales.  Abdominal: Soft. Bowel sounds are normal. He exhibits no distension and no mass. There is no  tenderness.  Musculoskeletal: Normal range of motion. He exhibits no edema and no tenderness.  Lymphadenopathy:    He has no cervical adenopathy.  Neurological: He is alert. Coordination normal.  Skin: Skin is warm and dry. No rash noted. No erythema.  Psychiatric: He has a normal mood and affect. His behavior is normal.    ED Course  Procedures (including critical care time)   Labs Reviewed  CBC  DIFFERENTIAL  APTT  PROTIME-INR  POCT I-STAT, CHEM 8  POCT I-STAT  TROPONIN I   Dg Chest Port 1 View  12/17/2011  *RADIOLOGY REPORT*  Clinical Data: Chest pain and shortness of breath.  PORTABLE CHEST - 1 VIEW  Comparison: Chest radiograph performed 07/28/2009  Findings: The lungs are well-aerated.  Minimal left basilar opacity likely reflects atelectasis.  Mildly increased left-sided lung markings are noted; this could conceivably reflect mild pneumonia. There is no evidence of pleural effusion or pneumothorax.  The cardiomediastinal silhouette is within normal limits.  No acute osseous abnormalities are seen.  IMPRESSION: Minimal left basilar airspace opacity likely reflects atelectasis. However, given mildly increased left-sided lung markings, a mild atypical pneumonia could conceivably have this appearance.  Original Report Authenticated By: Tonia Ghent, M.D.     1. Unstable angina       MDM  No swelling of the lower extremities, clear heart and lungs and EKG was does not show any signs of ischemia, the patient is a sinus bradycardia and compared with an old EKG there is no significant changes. We'll proceed with lab workup, chest x-ray and cardiology consultation. Because of patient's history, wrist factors and symptoms we'll start nitroglycerin drip, heparin bolus followed by drip. Aspirin given prior to arrival  ED ECG REPORT   Date: 12/17/2011 personally interpreted the EKG  Rate: 58  Rhythm: sinus bradycardia  QRS Axis: normal  Intervals: normal  ST/T Wave abnormalities: normal  Conduction Disutrbances:none  Narrative Interpretation:   Old EKG Reviewed: Compared with 07/28/2009, no significant changes   Heparin drip, nitroglycerin drip, patient still has stuttering chest pain. Discussed with cardiology on call, and they agree with admission to the hospital to the step down unit, critical care provided  CRITICAL CARE Performed by: Vida Roller   Total critical care time: 35  Critical care time was exclusive of separately billable  procedures and treating other patients.  Critical care was necessary to treat or prevent imminent or life-threatening deterioration.  Critical care was time spent personally by me on the following activities: development of treatment plan with patient and/or surrogate as well as nursing, discussions with consultants, evaluation of patient's response to treatment, examination of patient, obtaining history from patient or surrogate, ordering and performing treatments and interventions, ordering and review of laboratory studies, ordering and review of radiographic studies, pulse oximetry and re-evaluation of patient's condition.       Vida Roller, MD 12/17/11 [phone]

## 2011-12-17 NOTE — Progress Notes (Signed)
Utilization Review Completed.  [name] T.  12/17/2011  

## 2011-12-17 NOTE — Telephone Encounter (Signed)
Per Dr Antoine Poche he will be glad to see the pt.  Wife aware.  Pt is on the board to have a cardiac cath today.

## 2011-12-17 NOTE — Progress Notes (Signed)
Patient seen and case reviewed. Dr. Antoine Poche is seeing the patient now.  He has what sounds like USAP.  He still smokes.  ECG diffuse T flattening.  Enzymes are negative.   I have discussed the risks of cath with the patient and he and his wife are in the room.  He consents to proceed.  Risks reviewed and potential benefits.  I have encouraged him to stop smoking.   Dr. Rexene Edison will become the attending physician.

## 2011-12-17 NOTE — H&P (View-Only) (Signed)
As noted in H&P, patient with history of PVD, CVA and CAD admitted to [place] [facility] early this morning for HPI consistent with unstable angina. EKG with nonspecific TW changes. Troponin-I neg x 3. The patient wished to transfer care to Dr. Essance Gatti. We are happy to accept patient. Cardiac cath scheduled today to be performed by Dr. Stuckey. Pre-cath orders placed. Patient is currently comfortable on NTG gtt. Heparin gtt continued. Wife very apprehensive with Lopressor causing hypotension due to prior hypotensive episode on an unspecified antihypertensive while hospitalized. Discussed mechanism of Lopressor and benefit in the setting of ACS. Will hold for now. Further recommendations and plan to be determined by Dr. Stuckey/Dr. Kacey Vicuna.  

## 2011-12-17 NOTE — Telephone Encounter (Signed)
Pt in hospital and wife says he needs heart cath and does not want dr Katrinka Blazing to do it, would like dr hochrein to do it, he saw him last 02-29-2008, pls call to advise

## 2011-12-17 NOTE — CV Procedure (Signed)
   CARDIAC CATH NOTE  Name: [name] MRN: [id number] DOB: 26-Aug-1950  Procedure: PTCA and stenting of the LAD  Indication: 61 year old with unstable angina  Procedural Details: The left groin was re-prepped, draped, and anesthetized with 1% lidocaine. Using the modified Seldinger technique, a 6 Fr sheath was introduced into the right femoral graft. We used a 65F dilator to get the 104F sheath in.   Weight-based bivalirudin was given for anticoagulation. Once a therapeutic ACT was achieved, a 6 [place] JL3.5 guide catheter was inserted.  A [place] coronary guidewire was used to cross the lesion.  The lesion was predilated with a 2.62mm  balloon.  The lesion was then stented with a 2.5 by 24 mm Promus Element DES stent.  The stent was postdilated with a 2.47mm  noncompliant balloon.   The lesion was stiff and required nearly 17 atm for full expansion.   Following PCI, there was 0% residual stenosis and TIMI-3 flow. Final angiography confirmed an excellent result. The patient tolerated the procedure well. There were no immediate procedural complications. Femoral sheath was sutured in to place. . The patient was transferred to the post catheterization recovery area for further monitoring.  Lesion Data: Vessel: LAD/ segment 13 Percent stenosis (pre): 80 TIMI-flow (pre):  3 Stent:  2.5 by 24 Promus Element post dil 2.25mm Percent stenosis (post): 0 TIMI-flow (post): 3  Conclusions:  1.  USAP with successful PCI of the LAD with DES 2.  Continued tobacco use  Recommendations:  1.  FU JH 2.  DAPT for one year, then after at the discretion of the provider.  Notably, he is on chronic plavix due to his CVA.   3.  Tobacco cessation counseling  [name] 12/17/2011, 7:13 PM

## 2011-12-17 NOTE — Progress Notes (Signed)
  ANTICOAGULATION CONSULT NOTE - Initial Consult  Pharmacy Consult for Heparin Indication: chest pain/ACS  Allergies  Allergen Reactions  . Statins     Patient Measurements: Height: 5\' 9"  (175.3 cm) Weight: 200 lb (90.719 kg) IBW/kg (Calculated) : 70.7   Vital Signs: Temp: 97.7 F (36.5 C) (06/20 0439) Temp src: Oral (06/20 0439) BP: 110/74 mmHg (06/20 0439) Pulse Rate: 59  (06/20 0439)  Labs:  Basename 12/17/11 0251 12/17/11 0237  HGB 14.3 14.9  HCT 42.0 41.5  PLT -- 195  APTT -- 29  LABPROT -- 13.6  INR -- 1.02  HEPARINUNFRC -- --  CREATININE 1.00 --  CKTOTAL -- --  CKMB -- --  TROPONINI -- --    Estimated Creatinine Clearance: 88.5 ml/min (by C-G formula based on Cr of 1).   Medical History: Past Medical History  Diagnosis Date  . Hyperlipidemia   . PVD (peripheral vascular disease)     Aortobifemoral Bypass 2010, Thrombectomy and repair right brachial artery   . CAD (coronary artery disease) 12/2008    LHC: 50-60% mid LAD, 70% apical LAD, medically managed, complicated by brachial artery occlusion requiring vascular surgery  . CVA (cerebral infarction) 06/2009  . Elevated PSA 11/2011    scheduled to see urology    Medications:  Plavix  Folate  Niacin  Assessment: 61 yo male with chest pain for Heparin.  Heparin 4000 units IV bolus, 1100 units/hr started in ED at 0330  Goal of Therapy:  Heparin level 0.3-0.7 units/ml Monitor platelets by anticoagulation protocol: Yes   Plan:  Continue Heparin at current rate Check heparin level in 6 hours.   Tawsha Terrero, Gary Fleet 12/17/2011,4:57 AM

## 2011-12-17 NOTE — Progress Notes (Signed)
As noted in H&P, patient with history of PVD, CVA and CAD admitted to [facility] early this morning for HPI consistent with unstable angina. EKG with nonspecific TW changes. Troponin-I neg x 3. The patient wished to transfer care to Dr. Antoine Poche. We are happy to accept patient. Cardiac cath scheduled today to be performed by Dr. Riley Kill. Pre-cath orders placed. Patient is currently comfortable on NTG gtt. Heparin gtt continued. Wife very apprehensive with Lopressor causing hypotension due to prior hypotensive episode on an unspecified antihypertensive while hospitalized. Discussed mechanism of Lopressor and benefit in the setting of ACS. Will hold for now. Further recommendations and plan to be determined by Dr. Marcellus Scott. Antoine Poche.

## 2011-12-17 NOTE — ED Notes (Signed)
Patient stated he was awakened about 1am with CP to the left side of his chest.  Denies SOB, N/V, non radiating.

## 2011-12-17 NOTE — Progress Notes (Signed)
ANTICOAGULATION CONSULT NOTE - Follow Up Consult  Pharmacy Consult:  Heparin Indication:  Chest pain  Allergies  Allergen Reactions  . Statins     Patient Measurements: Height: 5\' 9"  (175.3 cm) Weight: 199 lb 4.7 oz (90.4 kg) IBW/kg (Calculated) : 70.7  Heparin Dosing Weight: 89 kg  Vital Signs: Temp: 97.7 F (36.5 C) (06/20 0739) Temp src: Oral (06/20 0739) BP: 118/65 mmHg (06/20 0900) Pulse Rate: 56  (06/20 0900)  Labs:  Basename 12/17/11 1023 12/17/11 0550 12/17/11 0251 12/17/11 0237  HGB -- -- 14.3 14.9  HCT -- -- 42.0 41.5  PLT -- -- -- 195  APTT -- -- -- 29  LABPROT -- -- -- 13.6  INR -- -- -- 1.02  HEPARINUNFRC 0.28* -- -- --  CREATININE -- -- 1.00 --  CKTOTAL -- 79 -- --  CKMB -- 1.4 -- --  TROPONINI -- <0.30 -- --    Estimated Creatinine Clearance: 88.4 ml/min (by C-G formula based on Cr of 1).       Assessment: 79 YOM with chest discomfort started on IV heparin.  Heparin level slightly below goal; no bleeding reported.  Awaiting patient/family decision on cath.   Goal of Therapy:  Heparin level 0.3-0.7 units/ml Monitor platelets by anticoagulation protocol: Yes     Plan:  - Increase heparin gtt to 1200 units/hr - Check 6 hr HL post rate change - Daily HL ./ CBC - F/U plans     [place] Warriner D. Laney Potash, PharmD, BCPS Pager:  [phone] 12/17/2011, 11:19 AM

## 2011-12-17 NOTE — ED Notes (Signed)
Patient was given 324mg  ASA, 3 NTG and 4mg  Morphine by EMS

## 2011-12-17 NOTE — Interval H&P Note (Signed)
History and Physical Interval Note:  12/17/2011 5:54 PM  [name]  has presented today for surgery, with the diagnosis of chest pain  The various methods of treatment have been discussed with the patient.  . After consideration of risks, benefits and other options for treatment, the patient has consented to  Procedure(s) (LRB): PCI as a surgical intervention .  The patient's history has been reviewed, patient examined, no change in status, stable for surgery.  I have reviewed the patients' chart and labs.  Questions were answered to the patient's satisfaction. The cath was done by Dr. Antoine Poche who wanted the PCI done today due to the difficulty with access.  I explained this with to the patient.      [name]

## 2011-12-18 ENCOUNTER — Encounter (HOSPITAL_COMMUNITY): Payer: Self-pay | Admitting: Cardiology

## 2011-12-18 DIAGNOSIS — Z720 Tobacco use: Secondary | ICD-10-CM | POA: Diagnosis present

## 2011-12-18 DIAGNOSIS — I200 Unstable angina: Secondary | ICD-10-CM

## 2011-12-18 DIAGNOSIS — E785 Hyperlipidemia, unspecified: Secondary | ICD-10-CM | POA: Diagnosis present

## 2011-12-18 LAB — BASIC METABOLIC PANEL
BUN: 12 mg/dL (ref 6–23)
Calcium: 8.7 mg/dL (ref 8.4–10.5)
Creatinine, Ser: 0.99 mg/dL (ref 0.50–1.35)
GFR calc Af Amer: >90 mL/min (ref >90)

## 2011-12-18 LAB — CBC
HCT: 40.3 % (ref 39.0–52.0)
MCH: 31 pg (ref 26.0–34.0)
MCHC: 35 g/dL (ref 30.0–36.0)
MCV: 88.6 fL (ref 78.0–100.0)
Platelets: 169 10*3/uL (ref 150–400)
RDW: 13.6 % (ref 11.5–15.5)

## 2011-12-18 LAB — LIPID PANEL
Cholesterol: 201 mg/dL — ABNORMAL HIGH (ref 0–200)
HDL: 34 mg/dL — ABNORMAL LOW (ref >39)
LDL Cholesterol: 114 mg/dL — ABNORMAL HIGH (ref 0–99)
Total CHOL/HDL Ratio: 5.9 RATIO
Triglycerides: 263 mg/dL — ABNORMAL HIGH (ref <150)
VLDL: 53 mg/dL — ABNORMAL HIGH (ref 0–40)

## 2011-12-18 MED ORDER — ASPIRIN 81 MG PO CHEW: 81.0000 mg | CHEWABLE_TABLET | Freq: Every day | ORAL | Status: AC

## 2011-12-18 MED ORDER — NITROGLYCERIN 0.4 MG SL SUBL: 0.4000 mg | SUBLINGUAL_TABLET | SUBLINGUAL | Status: DC | PRN

## 2011-12-18 MED ORDER — METOPROLOL TARTRATE 25 MG PO TABS: 12.5000 mg | ORAL_TABLET | Freq: Two times a day (BID) | ORAL | Status: DC

## 2011-12-18 MED ORDER — SODIUM CHLORIDE 0.9 % IV SOLN: INTRAVENOUS | Status: DC

## 2011-12-18 MED FILL — Dextrose Inj 5%: INTRAVENOUS | Qty: 50 | Status: AC

## 2011-12-18 NOTE — Progress Notes (Signed)
Reviewed discharge instructions with patient and family. Escorted patient out via wheelchair.   Denard Tuminello, Charlaine Dalton RN

## 2011-12-18 NOTE — Progress Notes (Signed)
CARDIAC REHAB PHASE I   PRE:  Rate/Rhythm: 62 SR    BP: sitting 120/73    SaO2: 97 2L, 95 RA  MODE:  Ambulation: 390 ft   POST:  Rate/Rhythm: 76 SR    BP: sitting 121/72     SaO2: 96 RA  Tolerated well. Sts ex is limited due to CVA. Ed completed  And requests his name be sent to [facility]. Sts he wants to quit smoking.  [id number]  Harriet Masson CES, ACSM

## 2011-12-18 NOTE — Progress Notes (Signed)
SUBJECTIVE:  No further chest pain.  No SOB.     PHYSICAL EXAM Filed Vitals:   12/18/11 0400 12/18/11 0401 12/18/11 0500 12/18/11 0700  BP: 101/62  115/77 107/70  Pulse: 57 62 58 53  Temp:  99 F (37.2 C)    TempSrc:      Resp: 16 19    Height:      Weight:      SpO2: 95% 95% 96% 98%   General:  No distress Lungs:  Clear Heart:  RRR Abdomen:  Positive bowel sounds, no rebound no guarding Extremities:  Right and left groin without bruising or hematoma.    LABS: Lab Results  Component Value Date   CKTOTAL 60 12/17/2011   CKMB 1.4 12/17/2011   TROPONINI <0.30 12/17/2011   Results for orders placed during the hospital encounter of 12/17/11 (from the past 24 hour(s))  HEPARIN LEVEL (UNFRACTIONATED)     Status: Abnormal   Collection Time   12/17/11 10:23 AM      Component Value Range   Heparin Unfractionated 0.28 (*) 0.30 - 0.70 IU/mL  CARDIAC PANEL(CRET KIN+CKTOT+MB+TROPI)     Status: Normal   Collection Time   12/17/11 10:23 AM      Component Value Range   Total CK 73  7 - 232 U/L   CK, MB 1.5  0.3 - 4.0 ng/mL   Troponin I <0.30  <0.30 ng/mL   Relative Index RELATIVE INDEX IS INVALID  0.0 - 2.5  POCT ACTIVATED CLOTTING TIME     Status: Normal   Collection Time   12/17/11  3:47 PM      Component Value Range   Activated Clotting Time 139    POCT ACTIVATED CLOTTING TIME     Status: Normal   Collection Time   12/17/11  5:54 PM      Component Value Range   Activated Clotting Time 144    POCT ACTIVATED CLOTTING TIME     Status: Normal   Collection Time   12/17/11  6:22 PM      Component Value Range   Activated Clotting Time 414    CARDIAC PANEL(CRET KIN+CKTOT+MB+TROPI)     Status: Normal   Collection Time   12/17/11  8:59 PM      Component Value Range   Total CK 60  7 - 232 U/L   CK, MB 1.4  0.3 - 4.0 ng/mL   Troponin I <0.30  <0.30 ng/mL   Relative Index RELATIVE INDEX IS INVALID  0.0 - 2.5  CBC     Status: Normal   Collection Time   12/18/11  5:05 AM   Component Value Range   WBC 4.8  4.0 - 10.5 K/uL   RBC 4.55  4.22 - 5.81 MIL/uL   Hemoglobin 14.1  13.0 - 17.0 g/dL   HCT 16.1  09.6 - 04.5 %   MCV 88.6  78.0 - 100.0 fL   MCH 31.0  26.0 - 34.0 pg   MCHC 35.0  30.0 - 36.0 g/dL   RDW 40.9  81.1 - 91.4 %   Platelets 169  150 - 400 K/uL  BASIC METABOLIC PANEL     Status: Abnormal   Collection Time   12/18/11  5:05 AM      Component Value Range   Sodium 142  135 - 145 mEq/L   Potassium 4.1  3.5 - 5.1 mEq/L   Chloride 108  96 - 112 mEq/L   CO2 23  19 -  32 mEq/L   Glucose, Bld 104 (*) 70 - 99 mg/dL   BUN 12  6 - 23 mg/dL   Creatinine, Ser 1.61  0.50 - 1.35 mg/dL   Calcium 8.7  8.4 - 09.6 mg/dL   GFR calc non Af Amer 88 (*) >90 mL/min   GFR calc Af Amer >90  >90 mL/min  LIPID PANEL     Status: Abnormal   Collection Time   12/18/11  5:05 AM      Component Value Range   Cholesterol 201 (*) 0 - 200 mg/dL   Triglycerides 045 (*) <150 mg/dL   HDL 34 (*) >40 mg/dL   Total CHOL/HDL Ratio 5.9     VLDL 53 (*) 0 - 40 mg/dL   LDL Cholesterol 981 (*) 0 - 99 mg/dL    Intake/Output Summary (Last 24 hours) at 12/18/11 0725 Last data filed at 12/18/11 0700  Gross per 24 hour  Intake 1504.5 ml  Output   1100 ml  Net  404.5 ml   ASSESSMENT AND PLAN:  Unstable angina:  PCI of LAD yesterday.  OK to discharge to day.  No apparent complications.  EKG reviewed and no acute ST T wave changes.  Tobacco:  The patient and his family were educated  Hyperlipidemia:  LDL 114.  Trig 263.  He is intolerant of statins.     Follow up with me in [place] if possible within two weeks.    Rollene Rotunda 12/18/2011 7:25 AM

## 2011-12-18 NOTE — Discharge Summary (Signed)
Discharge Summary   Patient ID: [name] MRN: [id number], DOB/AGE: [birth year] 61 y.o.  Primary MD: Sheila Oats, MD Primary Cardiologist: Dr. Antoine Poche in [place] Admit date: 12/17/2011 D/C date:     12/18/2011      Primary Discharge Diagnoses:  1. Unstable Angina/CAD  - cath 12/2008: 50-60% mid LAD, 70% apical LAD, medically managed  - cath 12/17/11 s/p DES to LAD  2. Tobacco Abuse, Ongoing  Secondary Discharge Diagnoses:  1. Hyperlipidemia - intolerant to statins 2. Peripheral Vascular Disease - Aortobifemoral Bypass 2010, Thrombectomy and repair right brachial artery  3. Cerebrovascular Disease 06/2009  4. Elevated PSA    Allergies Allergies  Allergen Reactions  . Statins     Diagnostic Studies/Procedures:   12/17/11 - Cardiac Cath Hemodynamics:  AO 120/67  LV 136/8  Coronary angiography:  Coronary dominance: Right  Left mainstem: LM normal  Left anterior descending (LAD): Proximal diffuse 25% stenosis. Proximal focal 80%. D1 moderate with luminal irregularities. D2 small normal.  Left circumflex (LCx): AV groove with luminal irregularities. Small. OM small to moderate with proximal 50%.  Ramus Intermedius: Large and branching with luminal irregularities.  Right coronary artery (RCA): Diffuse luminal irregularities. Long mid 50%. Distal diffuse 25%. PDA moderate with luminal irregularities. PL small normal  Left ventriculography: Left ventricular systolic function is normal, LVEF is estimated at 65%, there is no significant mitral regurgitation  Final Conclusions: Severe single vessel CAD with preserved EF.  Recommendations: Plan PCI of LAD  12/17/11 - Cardiac Cath Conclusions:  1. USAP with successful DES to the LAD  2. Continued tobacco use  Recommendations:  1. FU [facility]  2. DAPT for one year, then after at the discretion of the provider. Notably, he is on chronic plavix due to his CVA.  3. Tobacco cessation counseling   History of Present Illness: 61  y.o. male w/ the above medical problems who presented to [facility] on 12/17/11 with complaints of chest pain. He reported being woken up from sleep around 3:00am the morning of presentation with squeezing chest pain prompting him to presented to the ED.  Hospital Course: EKG revealed NSR with nonspecific T wave abnormalities. CXR showed left sided atelectasis. Labs were significant for normal poc troponin, unremarkable CBC and BMET, INR 1.02. He was placed on IV NTG and IV heparin with relief of chest pain. He was admitted for further evaluation and treatment of unstable angina.  Cardiac enzymes were cycled and remained negative. He underwent cardiac cath on 12/17/11 revealing severe single vessel CAD of prox LAD with preserved EF. Successful PCI of LAD with DES was performed. He tolerated the procedure without complications. Recommendations were made for one year of DAPT. A low dose beta blocker was initiated with close monitoring of blood pressure and heart rate. Tobacco cessation counseling was completed. He was able to ambulate without any further chest pain or sob.  He was seen and evaluated by Dr. Antoine Poche who felt he was stable for discharge home with plans for follow up as scheduled below.  Discharge Vitals: Blood pressure 115/74, pulse 58, temperature 98.2 F (36.8 C), temperature source Oral, resp. rate 17, height 5\' 9"  (1.753 m), weight 199 lb 4.7 oz (90.4 kg), SpO2 96.00%.  Labs: Component Value Date   WBC 4.8 12/18/2011   HGB 14.1 12/18/2011   HCT 40.3 12/18/2011   MCV 88.6 12/18/2011   PLT 169 12/18/2011    Lab 12/18/11 0505  NA 142  K 4.1  CL 108  CO2 23  BUN 12  CREATININE 0.99  CALCIUM 8.7  GLUCOSE 104*   Basename 12/17/11 2059 12/17/11 1023 12/17/11 0550  CKTOTAL 60 73 79  CKMB 1.4 1.5 1.4  TROPONINI <0.30 <0.30 <0.30   Component Value Date   CHOL 201* 12/18/2011   HDL 34* 12/18/2011   LDLCALC 114* 12/18/2011   TRIG 263* 12/18/2011   Discharge Medications    Medication List  As of 12/18/2011  9:59 AM   TAKE these medications         aspirin 81 MG chewable tablet   Chew 1 tablet (81 mg total) by mouth daily.      clopidogrel 75 MG tablet   Commonly known as: PLAVIX   Take 75 mg by mouth daily.      folic acid 400 MCG tablet   Commonly known as: FOLVITE   Take 400 mcg by mouth daily.      metoprolol tartrate 25 MG tablet   Commonly known as: LOPRESSOR   Take 0.5 tablets (12.5 mg total) by mouth 2 (two) times daily.      niacin 500 MG tablet   Commonly known as: SLO-NIACIN   Take 1,000 mg by mouth at bedtime and may repeat dose one time if needed.      nitroGLYCERIN 0.4 MG SL tablet   Commonly known as: NITROSTAT   Place 1 tablet (0.4 mg total) under the tongue every 5 (five) minutes as needed for chest pain (up to 3 doses).            Disposition   Discharge Orders    Future Appointments: Provider: Department: Dept Phone: Center:   01/13/2012 2:00 PM Rollene Rotunda, MD [facility] [phone] [facility]     Future Orders Please Complete By Expires   Diet - low sodium heart healthy      Increase activity slowly      Discharge instructions      Comments:   **PLEASE REMEMBER TO BRING ALL OF YOUR MEDICATIONS TO EACH OF YOUR FOLLOW-UP OFFICE VISITS.  * KEEP GROIN SITES CLEAN AND DRY. Call the office for any signs of bleedings, pus, swelling, increased pain, or any other concerns. * NO HEAVY LIFTING (>10lbs) OR SEXUAL ACTIVITY X 7 DAYS. * NO DRIVING X 3-5 DAYS. * NO SOAKING BATHS, HOT TUBS, POOLS, ETC., X 7 DAYS.  * Please stop smoking!     Follow-up Information    Follow up with Rollene Rotunda, MD on 01/13/2012. (2:00)    Contact information:   [employer] [address] [place] [address] [phone]          Outstanding Labs/Studies:  None  Duration of Discharge Encounter: Greater than 30 minutes including physician and PA time.  Signed, HOPE, JESSICA PA-C 12/18/2011, 9:59  AM  Patient seen and examined.  Plan as discussed in my rounding note for today and outlined above. Rollene Rotunda  12/18/2011  2:18 PM

## 2011-12-18 NOTE — Progress Notes (Signed)
Cath Lab Sheath Removal note: Left groin sheath present and intact. Sheath site area soft and 2+ pulses present in femoral, DP and PT of left foot. Patient reported no c/o of pain or discomfort at that present time. Discussed with patient the process of sheath removal and post sheath guidelines. Patient was able to verbalize understanding of teaching using his own words. At 2110 the sutures were cut and blood return was present in sheath. Manual pressure was applied at 2114 once sheath removed. Patient remained stable and site remain soft , pulse present , and no bleeding observed . Manual pressure eased up at 2130- site still soft and no bleeding observed. Manual pressure completed removed at 2140. Sheath site area soft, all pulses present and pressure dressing applied. Post cath instructions given to patient. Patient verbalized understanding in his own words and demonstrated how to apply pressure to his left groin.

## 2011-12-18 NOTE — Plan of Care (Signed)
Problem: Phase I Progression Outcomes Goal: Vascular site scale level 0 - I Vascular Site Scale Level 0: No bruising/bleeding/hematoma Level I (Mild): Bruising/Ecchymosis, minimal bleeding/ooozing, palpable hematoma < 3 cm Level II (Moderate): Bleeding not affecting hemodynamic parameters, pseudoaneurysm, palpable hematoma > 3 cm Outcome: Completed/Met Date Met:  12/18/11 L groin level 0

## 2012-01-13 ENCOUNTER — Ambulatory Visit (INDEPENDENT_AMBULATORY_CARE_PROVIDER_SITE_OTHER): Payer: Medicare HMO | Admitting: Cardiology

## 2012-01-13 ENCOUNTER — Encounter: Payer: Self-pay | Admitting: Cardiology

## 2012-01-13 VITALS — BP 110/70 | HR 74 | Ht 69.0 in | Wt 201.0 lb

## 2012-01-13 DIAGNOSIS — E785 Hyperlipidemia, unspecified: Secondary | ICD-10-CM

## 2012-01-13 DIAGNOSIS — Z720 Tobacco use: Secondary | ICD-10-CM

## 2012-01-13 DIAGNOSIS — I2510 Atherosclerotic heart disease of native coronary artery without angina pectoris: Secondary | ICD-10-CM

## 2012-01-13 DIAGNOSIS — I200 Unstable angina: Secondary | ICD-10-CM

## 2012-01-13 DIAGNOSIS — F17200 Nicotine dependence, unspecified, uncomplicated: Secondary | ICD-10-CM

## 2012-01-13 MED ORDER — CLOPIDOGREL BISULFATE 75 MG PO TABS: 75.0000 mg | ORAL_TABLET | Freq: Every day | ORAL | Status: DC

## 2012-01-13 NOTE — Progress Notes (Signed)
HPI The patient presents for followup after recent hospitalization for unstable angina. He had a cardiac catheterization demonstrating proximal focal 80% LAD stenosis. The right coronary artery had a long mid % stenosis. The EF was 65%. He underwent drug-eluting stent to the LAD.  Since he was in the hospital he's had no further chest discomfort. He feels like he's had more energy. Unfortunately he's been off of his Plavix because he thought he had a prescription through the [place] but that prescription had run out. He still unfortunately smoking cigarettes although he is trying to taper. He denies any new chest discomfort, neck or arm discomfort. He denies any new shortness of breath, PND or orthopnea. He has had no new weight gain or edema.   Allergies  Allergen Reactions  . Statins     Current Outpatient Prescriptions  Medication Sig Dispense Refill  . aspirin 81 MG chewable tablet Chew 1 tablet (81 mg total) by mouth daily.      . clopidogrel (PLAVIX) 75 MG tablet Take 75 mg by mouth daily.      . colesevelam (WELCHOL) 625 MG tablet Take 1,875 mg by mouth 2 (two) times daily with a meal. 1 tab in am and 2 in pm      . folic acid (FOLVITE) 400 MCG tablet Take 400 mcg by mouth daily.      . niacin (SLO-NIACIN) 500 MG tablet Take 1,000 mg by mouth at bedtime and may repeat dose one time if needed.       . nitroGLYCERIN (NITROSTAT) 0.4 MG SL tablet Place 1 tablet (0.4 mg total) under the tongue every 5 (five) minutes as needed for chest pain (up to 3 doses).  25 tablet  3    Past Medical History  Diagnosis Date  . Hyperlipidemia     intolerant to statins  . PVD (peripheral vascular disease)     Aortobifemoral Bypass 2010, Thrombectomy and repair right brachial artery   . CAD (coronary artery disease) 12/2008    cath 11/2011 s/p DES to LAD  . CVA (cerebral infarction) 06/2009  . Elevated PSA 11/2011    scheduled to see urology  . Tobacco abuse     Past Surgical History  Procedure Date    . Aortobifemoral bypass 2010  . 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  PHYSICAL EXAM BP 110/70  Pulse 74  Ht 5\' 9"  (1.753 m)  Wt 201 lb (91.173 kg)  BMI 29.68 kg/m2 GENERAL:  Well appearing HEENT:  Pupils equal round and reactive, fundi not visualized, oral mucosa unremarkable NECK:  No jugular venous distention, waveform within normal limits, carotid upstroke brisk and symmetric, no bruits, no thyromegaly LYMPHATICS:  No cervical, inguinal adenopathy LUNGS:  Clear to auscultation bilaterally BACK:  No CVA tenderness CHEST:  Unremarkable HEART:  PMI not displaced or sustained,S1 and S2 within normal limits, no S3, no S4, no clicks, no rubs, no murmurs ABD:  Flat, positive bowel sounds normal in frequency in pitch, no bruits, no rebound, no guarding, no midline pulsatile mass, no hepatomegaly, no splenomegaly EXT:  2 plus pulses throughout, no edema, no cyanosis no clubbing SKIN:  No rashes no nodules NEURO:  Cranial nerves II through XII grossly intact, motor grossly intact throughout PSYCH:  Cognitively intact, oriented to person place and time    ASSESSMENT AND  PLAN]   CAD -  I gave him samples of Plavix and instructions to load this will begin any prescription to get filled until [facility] prescription comes through. He understands the importance of not missing this medicine. Of note he needs to have a needle biopsy of his prostate. I asked him to discuss the timing of this with his urologist. I would like to continue the Plavix for a year if possible.  However this may need to be shortened depending on the urgency of the need for biopsy.  PVD -  He will continue with risk reduction.  Hyperlipidemia -  He is intolerant of statins.  He will continue on the meds as listed.  Elevated PSA -  As above.  Tobacco abuse -  He is unable to quit  smoking and does not want description. He has failed Chantix in the past.

## 2012-01-13 NOTE — Patient Instructions (Addendum)
The current medical regimen is effective;  continue present plan and medications.  Follow up in 8 weeks with Dr Antoine Poche.

## 2012-01-28 ENCOUNTER — Encounter: Payer: Self-pay | Admitting: Vascular Surgery

## 2012-02-28 DIAGNOSIS — R972 Elevated prostate specific antigen [PSA]: Secondary | ICD-10-CM

## 2012-02-28 HISTORY — DX: Elevated prostate specific antigen (PSA): R97.20

## 2012-03-09 ENCOUNTER — Ambulatory Visit (INDEPENDENT_AMBULATORY_CARE_PROVIDER_SITE_OTHER): Payer: Medicare HMO | Admitting: Cardiology

## 2012-03-09 ENCOUNTER — Encounter: Payer: Self-pay | Admitting: Cardiology

## 2012-03-09 VITALS — BP 122/80 | HR 70 | Ht 69.0 in | Wt 198.0 lb

## 2012-03-09 DIAGNOSIS — E785 Hyperlipidemia, unspecified: Secondary | ICD-10-CM

## 2012-03-09 DIAGNOSIS — F17200 Nicotine dependence, unspecified, uncomplicated: Secondary | ICD-10-CM

## 2012-03-09 DIAGNOSIS — Z720 Tobacco use: Secondary | ICD-10-CM

## 2012-03-09 DIAGNOSIS — I200 Unstable angina: Secondary | ICD-10-CM

## 2012-03-09 DIAGNOSIS — I2510 Atherosclerotic heart disease of native coronary artery without angina pectoris: Secondary | ICD-10-CM

## 2012-03-09 NOTE — Patient Instructions (Addendum)
Continue current medications as listed.  Follow up in 6 months with Dr Hochrein.  You will receive a letter in the mail 2 months before you are due.  Please call us when you receive this letter to schedule your follow up appointment.  

## 2012-03-09 NOTE — Progress Notes (Signed)
HPI The patient presents for follow up of CAD.  The patient has no new sypmtoms.  No further cardiovascular testing is indicated.  We will continue with aggressive risk reduction and meds as listed.  The patient denies any new symptoms such as chest discomfort, neck or arm discomfort. There has been no new shortness of breath, PND or orthopnea. There have been no reported palpitations, presyncope or syncope.  He works vigorously in his yard.   Allergies  Allergen Reactions  . Statins     Current Outpatient Prescriptions  Medication Sig Dispense Refill  . aspirin 81 MG chewable tablet Chew 1 tablet (81 mg total) by mouth daily.      . clopidogrel (PLAVIX) 75 MG tablet Take 1 tablet (75 mg total) by mouth daily.  30 tablet  6  . colesevelam (WELCHOL) 625 MG tablet Take 1,875 mg by mouth daily. 3 in pm      . folic acid (FOLVITE) 400 MCG tablet Take 400 mcg by mouth daily.      . niacin (SLO-NIACIN) 500 MG tablet Take 1,000 mg by mouth at bedtime and may repeat dose one time if needed.       . nitroGLYCERIN (NITROSTAT) 0.4 MG SL tablet Place 1 tablet (0.4 mg total) under the tongue every 5 (five) minutes as needed for chest pain (up to 3 doses).  25 tablet  3    Past Medical History  Diagnosis Date  . Hyperlipidemia     intolerant to statins  . PVD (peripheral vascular disease)     Aortobifemoral Bypass 2010, Thrombectomy and repair right brachial artery   . CAD (coronary artery disease) 12/2008    cath 11/2011 s/p DES to LAD  . CVA (cerebral infarction) 06/2009  . Elevated PSA 11/2011    scheduled to see urology  . Tobacco abuse     Past Surgical History  Procedure Date  . Aortobifemoral bypass 2010  . 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  PHYSICAL EXAM BP 122/80  Pulse 70  Ht 5\' 9"  (1.753 m)  Wt 198 lb (89.812 kg)  BMI 29.24  kg/m2GENERAL:  Well appearing HEENT:  Pupils equal round and reactive, fundi not visualized, oral mucosa unremarkable NECK:  No jugular venous distention, waveform within normal limits, carotid upstroke brisk and symmetric, no bruits, no thyromegaly LYMPHATICS:  No cervical, inguinal adenopathy LUNGS:  Clear to auscultation bilaterally BACK:  No CVA tenderness CHEST:  Unremarkable HEART:  PMI not displaced or sustained,S1 and S2 within normal limits, no S3, no S4, no clicks, no rubs, no murmurs ABD:  Flat, positive bowel sounds normal in frequency in pitch, no bruits, no rebound, no guarding, no midline pulsatile mass, no hepatomegaly, no splenomegaly EXT:  2 plus pulses throughout, no edema, no cyanosis no clubbing SKIN:  No rashes no nodules NEURO:  Cranial nerves II through XII grossly intact, motor grossly intact throughout PSYCH:  Cognitively intact, oriented to person place and time   ASSESSMENT AND PLAN  CAD -  The patient is back on Plavix which he had had some trouble getting from the [place]. He's having no new symptoms. No further cardiovascular testing is suggested.  PVD -  He will continue with risk reduction.   Hyperlipidemia -  He is intolerant of statins. He will continue on the meds as listed.  Elevated PSA -  Is going to scheduled followup with the urologist to discuss the timing of biopsy.  I think this would be reasonable to have done at 6 months PCI which would be January.  Certainly if his urologist is urgent to do this sooner we did discuss the timing of this and the risks versus benefits. He would not be a prohibitive risk to come off his Plavix at this point.  Tobacco abuse -  He's actually committed to trying stopping smoking. He has nicotine patches.

## 2012-06-06 LAB — LIPID PANEL
Cholesterol: 245 mg/dL — AB (ref 0–200)
HDL: 44 mg/dL (ref 35–70)

## 2012-06-08 ENCOUNTER — Telehealth: Payer: Self-pay | Admitting: Cardiology

## 2012-06-08 NOTE — Telephone Encounter (Signed)
[name] with Dr. Tawana Scale office (ENT) called office today. [name] is scheduled for a Microlaryngoscopy and Biopsy of Bladder (by Dr. Jerre Simon) On the same day in January 2014. Patient is on Plavix . Can he be cleared for surgery or does he need another office visit. Dr. Tawana Scale ext thru [facility] is [phone]

## 2012-06-14 NOTE — Telephone Encounter (Signed)
I will need to speak to Dr. Jerre Simon about this.  He should not come off of Plavix until June of next year.  Please send me his number. And let the patient know this.

## 2012-06-14 NOTE — Telephone Encounter (Signed)
Gena left another message on nurse's voicemail today following up on status of cardiac clearance and what to do about plavix.

## 2012-06-15 NOTE — Telephone Encounter (Signed)
I spoke with the patient and clarified. This is a necessary biopsy of his prostate and also removal of polyps from vocal cord. This could represent malignancy. The treatment would be necessary to start as soon as possible. He will be 7 months status post his stent placement. Therefore, the risks benefits are in favor of holding the Plavix for 7 days prior to these procedures. However, he should continue his aspirin. He does know that it the surgeons involved want him to stop the aspirin I would need to speak with them first.

## 2012-06-15 NOTE — Telephone Encounter (Signed)
Patient informed and said that he was informed at his last office visit that January 2014 would be okay to have his surgery. [phone] is office number for Javaid and office number for Dr. Andrey Campanile who initiated this call is [phone].

## 2012-06-16 NOTE — Telephone Encounter (Signed)
Information faxed

## 2012-06-29 DIAGNOSIS — C801 Malignant (primary) neoplasm, unspecified: Secondary | ICD-10-CM

## 2012-06-29 HISTORY — DX: Malignant (primary) neoplasm, unspecified: C80.1

## 2012-07-07 ENCOUNTER — Encounter: Payer: Self-pay | Admitting: Gastroenterology

## 2012-07-11 ENCOUNTER — Telehealth: Payer: Self-pay | Admitting: Cardiology

## 2012-07-11 NOTE — Telephone Encounter (Signed)
New Problem:    Called in needing a surgical clearance for the patient to have a prostate biopsy.  Please call back.

## 2012-07-11 NOTE — Telephone Encounter (Signed)
I spoke with Elease Hashimoto at [facility] and she is requesting clearance for the pt to have a prostate biopsy tomorrow morning.  This biopsy was added to a surgery that was already scheduled by Dr Andrey Campanile.  I made Elease Hashimoto aware that I would fax the phone note from 06/08/12 in [place] to her at [phone].  Per documentation on 06/15/12 Dr Antoine Poche noted the pt can proceed with surgery and can hold plavix 7 days prior but must continue ASA. I called the pt and confirmed that he is holding plavix at this time but remained on ASA.

## 2012-07-12 ENCOUNTER — Telehealth: Payer: Self-pay | Admitting: *Deleted

## 2012-07-12 HISTORY — PX: PROSTATE BIOPSY: SHX241

## 2012-07-12 HISTORY — PX: OTHER SURGICAL HISTORY: SHX169

## 2012-07-12 NOTE — Telephone Encounter (Signed)
Dr. Jerre Simon requesting that patient continue to hold plavix x's 7 days post procedures. Is this okay? Nurse called and asked MD and he said it was okay to hold plavix for 7 more days. Vicky informed.

## 2012-07-20 ENCOUNTER — Other Ambulatory Visit (HOSPITAL_COMMUNITY): Payer: Self-pay | Admitting: Otolaryngology

## 2012-07-20 DIAGNOSIS — C329 Malignant neoplasm of larynx, unspecified: Secondary | ICD-10-CM

## 2012-07-25 ENCOUNTER — Telehealth: Payer: Self-pay | Admitting: Cardiology

## 2012-07-25 NOTE — Telephone Encounter (Signed)
Will forward to Dr Hochrein  

## 2012-07-25 NOTE — Telephone Encounter (Signed)
Pt has been diagnosed with cancer and he wants to let you know what is going on

## 2012-07-26 ENCOUNTER — Encounter (HOSPITAL_COMMUNITY)
Admission: RE | Admit: 2012-07-26 | Discharge: 2012-07-26 | Disposition: A | Discharge status: Home / Self Care | Payer: Medicare HMO | Source: Ambulatory Visit | Attending: Otolaryngology | Admitting: Otolaryngology

## 2012-07-26 DIAGNOSIS — C329 Malignant neoplasm of larynx, unspecified: Secondary | ICD-10-CM

## 2012-07-26 LAB — GLUCOSE, CAPILLARY: Glucose-Capillary: 88 mg/dL (ref 70–99)

## 2012-07-26 MED ORDER — IOHEXOL 300 MG/ML  SOLN
100.0000 mL | Freq: Once | INTRAMUSCULAR | Status: AC | PRN
  Administered 2012-07-26: 100 mL via INTRAVENOUS

## 2012-07-26 MED ORDER — FLUDEOXYGLUCOSE F - 18 (FDG) INJECTION
19.1000 | Freq: Once | INTRAVENOUS | Status: AC | PRN
  Administered 2012-07-26: 19.1 via INTRAVENOUS

## 2012-09-09 ENCOUNTER — Encounter: Payer: Self-pay | Admitting: Physician Assistant

## 2012-09-13 ENCOUNTER — Encounter: Payer: Self-pay | Admitting: Physician Assistant

## 2012-09-13 ENCOUNTER — Ambulatory Visit (INDEPENDENT_AMBULATORY_CARE_PROVIDER_SITE_OTHER): Payer: Medicare HMO | Admitting: Physician Assistant

## 2012-09-13 VITALS — BP 128/85 | HR 63 | Temp 97.7°F | Ht 69.0 in | Wt 198.4 lb

## 2012-09-13 DIAGNOSIS — C61 Malignant neoplasm of prostate: Secondary | ICD-10-CM

## 2012-09-13 DIAGNOSIS — L0100 Impetigo, unspecified: Secondary | ICD-10-CM

## 2012-09-13 DIAGNOSIS — C329 Malignant neoplasm of larynx, unspecified: Secondary | ICD-10-CM | POA: Insufficient documentation

## 2012-09-13 DIAGNOSIS — E785 Hyperlipidemia, unspecified: Secondary | ICD-10-CM

## 2012-09-13 DIAGNOSIS — L509 Urticaria, unspecified: Secondary | ICD-10-CM

## 2012-09-13 LAB — COMPREHENSIVE METABOLIC PANEL
Albumin: 4.6 g/dL (ref 3.5–5.2)
Alkaline Phosphatase: 79 U/L (ref 39–117)
BUN: 12 mg/dL (ref 6–23)
Glucose, Bld: 84 mg/dL (ref 70–99)
Total Bilirubin: 0.5 mg/dL (ref 0.3–1.2)

## 2012-09-13 LAB — POCT CBC
HCT, POC: 49.9 % (ref 43.5–53.7)
Lymph, poc: 1.9 (ref 0.6–3.4)
MCH, POC: 30.6 pg (ref 27–31.2)
MCHC: 33.6 g/dL (ref 31.8–35.4)
POC Granulocyte: 3.8 (ref 2–6.9)
POC LYMPH PERCENT: 31.5 %L (ref 10–50)
RDW, POC: 13.1 %

## 2012-09-13 MED ORDER — BETAMETHASONE DIPROPIONATE 0.05 % EX CREA: TOPICAL_CREAM | Freq: Two times a day (BID) | CUTANEOUS | Status: DC

## 2012-09-13 MED ORDER — HYDROXYZINE HCL 10 MG PO TABS: 10.0000 mg | ORAL_TABLET | Freq: Three times a day (TID) | ORAL | Status: DC | PRN

## 2012-09-13 MED ORDER — SULFAMETHOXAZOLE-TMP DS 800-160 MG PO TABS: 1.0000 | ORAL_TABLET | Freq: Two times a day (BID) | ORAL | Status: DC

## 2012-09-13 NOTE — Progress Notes (Addendum)
  Subjective:    Patient ID: [name], male    DOB: [birth year], 62 y.o.   MRN: [id number]  HPI Comments: Scheduled check for htn, CAD, angina. Back and arms are getting an itchy sensation for the past couple of months     Review of Systems  Constitutional: Negative.   HENT: Negative.   Eyes: Negative.   Respiratory: Negative.   Cardiovascular: Negative.   Gastrointestinal: Negative.   Endocrine: Negative.   Genitourinary: Negative.   Musculoskeletal: Negative.   Skin: Positive for rash.       itching  Allergic/Immunologic: Negative.   Neurological: Negative.   Hematological: Negative.   Psychiatric/Behavioral: Negative.   All other systems reviewed and are negative.       Objective:   Physical Exam  Vitals reviewed. Constitutional: He is oriented to person, place, and time. He appears well-developed and well-nourished.  HENT:  Head: Normocephalic and atraumatic.  Right Ear: External ear normal.  Left Ear: External ear normal.  Nose: Nose normal.  Mouth/Throat: Oropharynx is clear and moist.  Eyes: Conjunctivae and EOM are normal. Pupils are equal, round, and reactive to light.  Neck: Normal range of motion. Neck supple.  Cardiovascular: Normal rate, regular rhythm, normal heart sounds and intact distal pulses.   Pulmonary/Chest: Effort normal and breath sounds normal.  Musculoskeletal: Normal range of motion.  Neurological: He is alert and oriented to person, place, and time.  Skin: Rash noted.  Multiple open lesions across scapular region, chest wall, arms; pruritic, various stages of weeping and healing  Psychiatric: He has a normal mood and affect. His behavior is normal. Judgment and thought content normal.          Assessment & Plan:  Impetigo  Bactrim DS #20 iPO bid betemethasone 0.05% cream qd Atarax 10 mg qhs

## 2012-09-14 ENCOUNTER — Ambulatory Visit: Payer: Medicare HMO | Admitting: Cardiology

## 2012-09-14 NOTE — Progress Notes (Signed)
Quick Note:  Labs were within normal limits ______ 

## 2012-09-28 ENCOUNTER — Encounter: Payer: Self-pay | Admitting: Cardiology

## 2012-09-28 ENCOUNTER — Ambulatory Visit (INDEPENDENT_AMBULATORY_CARE_PROVIDER_SITE_OTHER): Payer: Medicare HMO | Admitting: Cardiology

## 2012-09-28 VITALS — BP 120/77 | HR 62 | Ht 69.0 in | Wt 198.0 lb

## 2012-09-28 DIAGNOSIS — I2510 Atherosclerotic heart disease of native coronary artery without angina pectoris: Secondary | ICD-10-CM

## 2012-09-28 NOTE — Progress Notes (Signed)
HPI The patient presents for follow up of CAD.  Since I last saw him he's had radiation for laryngeal cancer. He's also going to have to undergo some therapy for prostate cancer. The patient has no new cardiovascular sypmtoms.   The patient denies any new symptoms such as chest discomfort, neck or arm discomfort. There has been no new shortness of breath, PND or orthopnea. There have been no reported palpitations, presyncope or syncope.  He is starting again to work vigorously in his yard.  With this he brings on no symptoms.   Allergies  Allergen Reactions  . Statins     Current Outpatient Prescriptions  Medication Sig Dispense Refill  . aspirin 81 MG chewable tablet Chew 1 tablet (81 mg total) by mouth daily.      . clopidogrel (PLAVIX) 75 MG tablet Take 1 tablet (75 mg total) by mouth daily.  30 tablet  6  . colesevelam (WELCHOL) 625 MG tablet Take 1,875 mg by mouth daily. 3 in pm      . folic acid (FOLVITE) 400 MCG tablet Take 400 mcg by mouth daily.      . niacin (SLO-NIACIN) 500 MG tablet Take 1,000 mg by mouth at bedtime and may repeat dose one time if needed.       . nitroGLYCERIN (NITROSTAT) 0.4 MG SL tablet Place 1 tablet (0.4 mg total) under the tongue every 5 (five) minutes as needed for chest pain (up to 3 doses).  25 tablet  3   No current facility-administered medications for this visit.    Past Medical History  Diagnosis Date  . Hyperlipidemia     intolerant to statins  . PVD (peripheral vascular disease)     Aortobifemoral Bypass 2010, Thrombectomy and repair right brachial artery   . CAD (coronary artery disease) 12/2008    cath 11/2011 s/p DES to LAD  . CVA (cerebral infarction) 06/2009  . Elevated PSA 11/2011    scheduled to see urology  . Tobacco abuse   . Unstable angina   . Cancer 06/2012    prostate, larynx    Past Surgical History  Procedure Laterality Date  . Aortobifemoral bypass  2010  . 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  PHYSICAL EXAM BP 120/77  Pulse 62  Ht 5\' 9"  (1.753 m)  Wt 198 lb (89.812 kg)  BMI 29.23 kg/m2GENERAL:  Well appearing HEENT:  Pupils equal round and reactive, fundi not visualized, oral mucosa unremarkable NECK:  No jugular venous distention, waveform within normal limits, carotid upstroke brisk and symmetric, no bruits, no thyromegaly LYMPHATICS:  No cervical, inguinal adenopathy LUNGS:  Clear to auscultation bilaterally CHEST:  Unremarkable HEART:  PMI not displaced or sustained,S1 and S2 within normal limits, no S3, no S4, no clicks, no rubs, no murmurs ABD:  Flat, positive bowel sounds normal in frequency in pitch, no bruits, no rebound, no guarding, no midline pulsatile mass, no hepatomegaly, no splenomegaly EXT:  2 plus pulses throughout, no edema, no cyanosis no clubbing SKIN:  No rashes no nodules, radiation injury on his neck.   EKG: Sinus rhythm, rate 60 to, RSR prime V1 and V2, early transition, no acute ST-T wave changes. 09/28/2012  ASSESSMENT AND PLAN  CAD -  The patient is back on Plavix which he had had some trouble getting from  the [place]. He's having no new symptoms. No further cardiovascular testing is suggested.  For now I would like to continue him on the Plavix though it certainly could be an held for any necessary prostate procedures.    PVD -  He will continue with risk reduction.   Hyperlipidemia -  He is intolerant of statins. His LDL was 152 recently but he is on maximal therapy.  Prostate Cancer -  As above  Tobacco abuse -  He has a severe addiction but he is trying to quit smoking and has been using nicotine patches.

## 2012-09-28 NOTE — Patient Instructions (Addendum)
The current medical regimen is effective;  continue present plan and medications.  Follow up in 1 year with Dr Hochrein.  You will receive a letter in the mail 2 months before you are due.  Please call us when you receive this letter to schedule your follow up appointment.  

## 2012-10-05 ENCOUNTER — Encounter: Payer: Self-pay | Admitting: Oncology

## 2012-10-06 ENCOUNTER — Ambulatory Visit
Admission: RE | Admit: 2012-10-06 | Discharge: 2012-10-06 | Disposition: A | Discharge status: Home / Self Care | Payer: Medicare HMO | Source: Ambulatory Visit | Attending: Radiation Oncology | Admitting: Radiation Oncology

## 2012-10-06 ENCOUNTER — Encounter: Payer: Self-pay | Admitting: Radiation Oncology

## 2012-10-06 VITALS — BP 126/82 | HR 73 | Temp 98.1°F | Ht 69.0 in | Wt 200.2 lb

## 2012-10-06 DIAGNOSIS — Z7982 Long term (current) use of aspirin: Secondary | ICD-10-CM | POA: Insufficient documentation

## 2012-10-06 DIAGNOSIS — Z7902 Long term (current) use of antithrombotics/antiplatelets: Secondary | ICD-10-CM | POA: Insufficient documentation

## 2012-10-06 DIAGNOSIS — Z8673 Personal history of transient ischemic attack (TIA), and cerebral infarction without residual deficits: Secondary | ICD-10-CM | POA: Insufficient documentation

## 2012-10-06 DIAGNOSIS — N529 Male erectile dysfunction, unspecified: Secondary | ICD-10-CM | POA: Insufficient documentation

## 2012-10-06 DIAGNOSIS — E785 Hyperlipidemia, unspecified: Secondary | ICD-10-CM | POA: Insufficient documentation

## 2012-10-06 DIAGNOSIS — Z79899 Other long term (current) drug therapy: Secondary | ICD-10-CM | POA: Insufficient documentation

## 2012-10-06 DIAGNOSIS — C61 Malignant neoplasm of prostate: Secondary | ICD-10-CM

## 2012-10-06 DIAGNOSIS — F17200 Nicotine dependence, unspecified, uncomplicated: Secondary | ICD-10-CM | POA: Insufficient documentation

## 2012-10-06 DIAGNOSIS — I2510 Atherosclerotic heart disease of native coronary artery without angina pectoris: Secondary | ICD-10-CM | POA: Insufficient documentation

## 2012-10-06 DIAGNOSIS — I739 Peripheral vascular disease, unspecified: Secondary | ICD-10-CM | POA: Insufficient documentation

## 2012-10-06 NOTE — Progress Notes (Signed)
[name] is here for a consult for prostate cancer accompanied by his wife.  He recently finished radiation treatment for larynx cancer in [place].  He states that his voice was hoarse and he did have trouble swallowing during treatment.  Both symptoms are improving.  He denies pain.  He does have fatigue.  He does report trouble stopping and starting during urination.  He has to get up twice a night usually to urinate.  He denies hematuria.

## 2012-10-06 NOTE — Progress Notes (Signed)
[facility] Radiation Oncology Consulation/Follow-up  Name: Fahad Cisse MRN: [id number]  Date:   10/06/2012           DOB: 05-06-1951  Status: outpatient   CC: Rudi Heap, MD  Lance Bosch, MD    REFERRING PHYSICIAN: Lance Bosch, MD   DIAGNOSIS:  Stage TI C. versus T2 B. favorable risk adenocarcinoma prostate  HISTORY OF PRESENT ILLNESS:  [name] is a 62 y.o. male who is seen today at the request of Dr. Thersa Salt for evaluation of his favorable risk adenocarcinoma prostate. He presented with an elevated PSA of 6.54 in September 2013. His PSA was 4.09 back in 2009. He was seen by Dr. Jerre Simon who performed ultrasound-guided biopsies on 07/12/2012 at [facility]. His then had Gleason 6 (3+3) involving 1/10 specimens. He also presented with progressive hoarseness and was found by Dr. 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 He just completed radiation therapy to his larynx is seen today for discussion of possible seed implantation for his prostate cancer. He does have moderate obstructive urinary symptomatology with an I PSS score of 14. No GI difficulties. He does have erectile dysfunction. Understand that he does have significant peripheral vascular disease and also coronary artery disease and is currently taking Plavix and aspirin. He continues to smoke. He states that he remains a very good deal of stress and his daughter recently had injuries from a motor vehicle accident.  PREVIOUS RADIATION THERAPY: Recently status post external beam radiation therapy for laryngeal cancer at the [facility] hospital/[facility] under the direction of Dr. Thersa Salt.   PAST MEDICAL HISTORY:  has a past medical history of Hyperlipidemia; PVD (peripheral vascular disease); CAD (coronary artery disease) (12/2008); CVA (cerebral infarction) (06/2009); Elevated PSA (02/2012); Tobacco abuse; Unstable angina; Cancer  (06/2012); and Radiation (08/10/2012-09/22/2012).     PAST SURGICAL HISTORY:  Past Surgical History  Procedure Laterality Date  . Aortobifemoral bypass  2010  . 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 Larynx biopsy  07/12/12  . Prostate biopsy  07/12/12    Gleason's grade 3+3+6  . Coronary angioplasty with stent placement  11/2011     FAMILY HISTORY: family history includes Breast cancer in his mother; Coronary artery disease (age of onset: 95) in his father; Coronary artery disease (age of onset: 61) in his brother; and Heart disease in his brother and father. His father died of a heart attack at 54. His mother died of a heart attack at 5. No family history of prostate cancer.   SOCIAL HISTORY:  reports that he has been smoking Cigarettes.  He started smoking about 45 years ago. He has a 45 pack-year smoking history. He does not have any smokeless tobacco history on file. He reports that he does not drink alcohol or use illicit drugs. Married, 3 children. He is on disability from his peripheral vascular disease, coronary artery disease and also degenerative cervical spine disease. He worked as a Naval architect.  ALLERGIES: Statins   MEDICATIONS:  Current Outpatient Prescriptions  Medication Sig Dispense Refill  . aspirin 81 MG chewable tablet Chew 1 tablet (81 mg total) by mouth daily.      . clopidogrel (PLAVIX) 75 MG tablet Take 1 tablet (75 mg total) by mouth daily.  30 tablet  6  . colesevelam (WELCHOL) 625 MG tablet Take 1,875 mg by mouth daily.  3 in pm      . folic acid (FOLVITE) 400 MCG tablet Take 400 mcg by mouth daily.      . niacin (SLO-NIACIN) 500 MG tablet Take 1,000 mg by mouth at bedtime and may repeat dose one time if needed.       . nitroGLYCERIN (NITROSTAT) 0.4 MG SL tablet Place 1 tablet (0.4 mg total) under the tongue every 5 (five) minutes as needed for chest pain (up to 3 doses).  25 tablet  3    No current facility-administered medications for this encounter.     REVIEW OF SYSTEMS:  Pertinent items are noted in HPI.    PHYSICAL EXAM:  height is 5\' 9"  (1.753 m) and weight is 200 lb 3.2 oz (90.81 kg). His temperature is 98.1 F (36.7 C). His blood pressure is 126/82 and his pulse is 73. His oxygen saturation is 98%.   Alert, oriented, and hoarse 62 year old white male appearing his stated age. Head neck examination: There is residual erythema/hyperpigmentation of the skin along the anterior neck from his recent radiation therapy. There is no palpable lymphadenopathy in the neck. Chest: Lungs clear. Heart: Regular rate rhythm. Back: Without spinal or CVA tenderness. Abdomen: Without masses organomegaly. Genitalia: Unremarkable to inspection. Rectal: The prostate gland is normal in size and there is raised induration noted along his left lateral gland extending from the base towards the lower lateral mid gland. No discrete nodularity. No palpable periprostatic tumor extension or seminal vesicle involvement. His gland volume is felt to be approximately 30 cc. Extremities: Without edema.   LABORATORY DATA:  Lab Results  Component Value Date   WBC 6.0 09/13/2012   HGB 16.8 09/13/2012   HCT 49.9 09/13/2012   MCV 91.2 09/13/2012   PLT 169 12/18/2011   Lab Results  Component Value Date   NA 140 09/13/2012   K 4.1 09/13/2012   CL 102 09/13/2012   CO2 27 09/13/2012   Lab Results  Component Value Date   ALT 15 09/13/2012   AST 14 09/13/2012   ALKPHOS 79 09/13/2012   BILITOT 0.5 09/13/2012   PSA 6.54 from 03/09/2012.   IMPRESSION: Stage TI C. versus T2 B. favorable risk adenocarcinoma prostate. On his digital rectal examination there is a fair amount of induration along the left lateral gland, and one would come to the conclusion that this does not represent cancer with only 1 of 10 biopsies containing adenocarcinoma. We discussed surgery versus radiation therapy versus close surveillance.  Close surveillance is certainly a reasonable option considering his medical comorbidities along with low volume Gleason 6 carcinoma. He understands that he is more likely to die from a cardiac event than from metastatic prostate cancer. If he chooses this option then I would request a prostate MRI scan just to make sure that we are not dealing with more advanced disease. He need to have followup PSA determinations every 3-6 months and consideration of repeat biopsies depending on his PSA velocity. Radiation therapy options include seed implantation versus 8 weeks of external beam/IMRT. Both would be highly curative. He is not interested in external beam/IMRT because of the required 8 weeks of daily treatment. I would personally favor close surveillance/observation as long as a MRI scan does not show more advanced disease. If he wants to proceed with seed implantation then I would see consultation with [facility] to assist with his seed implant. He was given literature for review. He'll contact me if he wants to proceed with seed implantation  or close surveillance.   PLAN: As discussed above.  I spent 45 minutes minutes face to face with the patient and more than 50% of that time was spent in counseling and/or coordination of care.

## 2012-10-06 NOTE — Progress Notes (Signed)
Please see the Nurse Progress Note in the MD Initial Consult Encounter for this patient. 

## 2012-10-06 NOTE — Addendum Note (Signed)
Encounter addended by: Eduardo Osier, RN on: 10/06/2012 12:56 PM<BR>     Documentation filed: Charges VN

## 2012-10-07 ENCOUNTER — Encounter: Payer: Self-pay | Admitting: Physician Assistant

## 2012-10-10 ENCOUNTER — Encounter: Payer: Self-pay | Admitting: Radiation Oncology

## 2012-10-10 ENCOUNTER — Telehealth: Payer: Self-pay | Admitting: *Deleted

## 2012-10-10 NOTE — Progress Notes (Signed)
[name] called this morning and he wants to proceed with a seed implant. I will go ahead and get him scheduled for consultation at [facility], and also schedule a CT arch study. Consent can be obtained when he visits for his CT arch study.

## 2012-10-10 NOTE — Telephone Encounter (Signed)
Called patient to inform of appt. With Dr. Annabell Howells on April 30- arrival time- 3:30 pm, spoke with patient and he is aware of this appt.

## 2012-11-12 ENCOUNTER — Encounter: Payer: Self-pay | Admitting: Radiation Oncology

## 2012-11-12 NOTE — Progress Notes (Signed)
I spoke with the patient by telephone this morning. He is undergoing further evaluation by Dr. Annabell Howells. After this evaluation he may be considered for seed implantation. Again, it may be helpful to obtain a MRI scan to further assess his clinical stage since his digital examination does not correlate well with his biopsy findings. I told the patient that he still may be better suited for close surveillance based on his medical comorbidities. Dr. Annabell Howells and I can discuss his case after his evaluation.

## 2012-11-23 ENCOUNTER — Telehealth: Payer: Self-pay | Admitting: *Deleted

## 2012-11-23 ENCOUNTER — Telehealth: Payer: Self-pay | Admitting: Cardiology

## 2012-11-23 NOTE — Telephone Encounter (Signed)
New problem   Fax [phone] attention brenda.   Need something in written for cardiac clearance surgery on  7/23.

## 2012-11-23 NOTE — Telephone Encounter (Signed)
Called patient to inform of gold seed placement on 01-18-13 at 8:30 am at the urologist's office, lvm for a return call

## 2012-11-24 ENCOUNTER — Telehealth: Payer: Self-pay | Admitting: *Deleted

## 2012-11-24 NOTE — Telephone Encounter (Signed)
After reviewing most recent office visit note from 4/14 Dr Antoine Poche has given patient cardiac clearance for upcoming surgery.  Information will be faxed to [facility] as requested.

## 2012-11-24 NOTE — Telephone Encounter (Signed)
Called patient to inform of sim for 01-23-13 at 9;00 a.m. At Dr. Rennie Plowman Office, spoke with patient and he is aware of this appt.

## 2012-12-12 ENCOUNTER — Ambulatory Visit (INDEPENDENT_AMBULATORY_CARE_PROVIDER_SITE_OTHER): Payer: Medicare HMO | Admitting: *Deleted

## 2012-12-12 DIAGNOSIS — IMO0002 Reserved for concepts with insufficient information to code with codable children: Secondary | ICD-10-CM

## 2012-12-12 DIAGNOSIS — Z0289 Encounter for other administrative examinations: Secondary | ICD-10-CM

## 2012-12-12 NOTE — Progress Notes (Signed)
MMSE was completed successfully.  Blood was drawn and shipped. Participant is Off study drug but will continue with follow-up visits per protocol.

## 2012-12-14 ENCOUNTER — Telehealth: Payer: Self-pay | Admitting: Cardiology

## 2012-12-14 NOTE — Telephone Encounter (Signed)
New problem   Pt wants to know if it's ok to take plavix since he's on a blood thinner

## 2012-12-14 NOTE — Telephone Encounter (Signed)
Pt wanted to know if it is OK for him to use Chantix while on Plavix.  Advised OK.

## 2012-12-15 ENCOUNTER — Encounter: Payer: Self-pay | Admitting: General Practice

## 2012-12-15 ENCOUNTER — Ambulatory Visit (INDEPENDENT_AMBULATORY_CARE_PROVIDER_SITE_OTHER): Payer: Medicare HMO | Admitting: General Practice

## 2012-12-15 VITALS — BP 115/78 | HR 72 | Temp 97.9°F | Ht 69.0 in | Wt 200.0 lb

## 2012-12-15 DIAGNOSIS — E785 Hyperlipidemia, unspecified: Secondary | ICD-10-CM

## 2012-12-15 LAB — COMPLETE METABOLIC PANEL WITH GFR
ALT: 12 U/L (ref 0–53)
AST: 13 U/L (ref 0–37)
CO2: 24 mEq/L (ref 19–32)
Calcium: 9.4 mg/dL (ref 8.4–10.5)
Chloride: 105 mEq/L (ref 96–112)
GFR, Est African American: 87 mL/min
Potassium: 4.2 mEq/L (ref 3.5–5.3)
Sodium: 137 mEq/L (ref 135–145)
Total Protein: 6.7 g/dL (ref 6.0–8.3)

## 2012-12-15 LAB — POCT CBC
Granulocyte percent: 72.2 %G (ref 37–80)
HCT, POC: 45.8 % (ref 43.5–53.7)
Hemoglobin: 16.6 g/dL (ref 14.1–18.1)
MCV: 90.5 fL (ref 80–97)
RBC: 5.1 M/uL (ref 4.69–6.13)
RDW, POC: 13.6 %
WBC: 7.1 10*3/uL (ref 4.6–10.2)

## 2012-12-15 NOTE — Progress Notes (Signed)
  Subjective:    Patient ID: [name], male    DOB: January 30, 1951, 62 y.o.   MRN: [id number]  HPI Patient presents today for 3 month follow up of chronic health conditions. He reports all medications are prescribed by the [facility] hospital. He reports taking medications as prescribed. He has hyperlipidemia, PVD, unstable angina, CAD, larynx carcinoma, and prostate cancer. He reports completing radiation and will begin treatment for prostate on January 18, 2013. He reports still smoking, but scheduled to begin chantix today.      Review of Systems  Constitutional: Negative for fever and chills.  HENT: Positive for ear pain and trouble swallowing. Negative for rhinorrhea, sneezing, neck pain, postnasal drip and sinus pressure.   Respiratory: Negative for chest tightness and shortness of breath.   Cardiovascular: Negative for chest pain, palpitations and leg swelling.  Gastrointestinal: Negative for nausea, vomiting, blood in stool and anal bleeding.  Genitourinary: Negative for urgency, hematuria and difficulty urinating.  Musculoskeletal: Negative for myalgias and back pain.  Neurological: Negative for dizziness, weakness and numbness.       Objective:   Physical Exam  Constitutional: He is oriented to person, place, and time. He appears well-developed and well-nourished.  HENT:  Head: Normocephalic and atraumatic.  Right Ear: External ear normal.  Left Ear: External ear normal.  Mouth/Throat: Oropharynx is clear and moist.  Eyes: EOM are normal.  Neck: Normal range of motion. Neck supple.  Cardiovascular: Normal rate, regular rhythm and normal heart sounds.   Pulmonary/Chest: Effort normal and breath sounds normal. No respiratory distress. He exhibits no tenderness.  Abdominal: Soft. Bowel sounds are normal. He exhibits no distension. There is no tenderness.  Musculoskeletal:  Right leg stiffness noted with gait. Patient has steady gait.   Neurological: He is alert and oriented to person,  place, and time.  Skin: Skin is warm and dry.  Psychiatric: He has a normal mood and affect.          Assessment & Plan:  1. Other and unspecified hyperlipidemia - POCT CBC - NMR Lipoprofile with Lipids - COMPLETE METABOLIC PANEL WITH GFR

## 2012-12-15 NOTE — Patient Instructions (Signed)
Hypertriglyceridemia  Diet for High blood levels of Triglycerides Most fats in food are triglycerides. Triglycerides in your blood are stored as fat in your body. High levels of triglycerides in your blood may put you at a greater risk for heart disease and stroke.  Normal triglyceride levels are less than 150 mg/dL. Borderline high levels are 150-199 mg/dl. High levels are 200 - 499 mg/dL, and very high triglyceride levels are greater than 500 mg/dL. The decision to treat high triglycerides is generally based on the level. For people with borderline or high triglyceride levels, treatment includes weight loss and exercise. Drugs are recommended for people with very high triglyceride levels. Many people who need treatment for high triglyceride levels have metabolic syndrome. This syndrome is a collection of disorders that often include: insulin resistance, high blood pressure, blood clotting problems, high cholesterol and triglycerides. TESTING PROCEDURE FOR TRIGLYCERIDES  You should not eat 4 hours before getting your triglycerides measured. The normal range of triglycerides is between 10 and 250 milligrams per deciliter (mg/dl). Some people may have extreme levels (1000 or above), but your triglyceride level may be too high if it is above 150 mg/dl, depending on what other risk factors you have for heart disease.  People with high blood triglycerides may also have high blood cholesterol levels. If you have high blood cholesterol as well as high blood triglycerides, your risk for heart disease is probably greater than if you only had high triglycerides. High blood cholesterol is one of the main risk factors for heart disease. CHANGING YOUR DIET  Your weight can affect your blood triglyceride level. If you are more than 20% above your ideal body weight, you may be able to lower your blood triglycerides by losing weight. Eating less and exercising regularly is the best way to combat this. Fat provides more  calories than any other food. The best way to lose weight is to eat less fat. Only 30% of your total calories should come from fat. Less than 7% of your diet should come from saturated fat. A diet low in fat and saturated fat is the same as a diet to decrease blood cholesterol. By eating a diet lower in fat, you may lose weight, lower your blood cholesterol, and lower your blood triglyceride level.  Eating a diet low in fat, especially saturated fat, may also help you lower your blood triglyceride level. Ask your dietitian to help you figure how much fat you can eat based on the number of calories your caregiver has prescribed for you.  Exercise, in addition to helping with weight loss may also help lower triglyceride levels.   Alcohol can increase blood triglycerides. You may need to stop drinking alcoholic beverages.  Too much carbohydrate in your diet may also increase your blood triglycerides. Some complex carbohydrates are necessary in your diet. These may include bread, rice, potatoes, other starchy vegetables and cereals.  Reduce "simple" carbohydrates. These may include pure sugars, candy, honey, and jelly without losing other nutrients. If you have the kind of high blood triglycerides that is affected by the amount of carbohydrates in your diet, you will need to eat less sugar and less high-sugar foods. Your caregiver can help you with this.  Adding 2-4 grams of fish oil (EPA+ DHA) may also help lower triglycerides. Speak with your caregiver before adding any supplements to your regimen. Following the Diet  Maintain your ideal weight. Your caregivers can help you with a diet. Generally, eating less food and getting more   exercise will help you lose weight. Joining a weight control group may also help. Ask your caregivers for a good weight control group in your area.  Eat low-fat foods instead of high-fat foods. This can help you lose weight too.  These foods are lower in fat. Eat MORE of these:    Dried beans, peas, and lentils.  Egg whites.  Low-fat cottage cheese.  Fish.  Lean cuts of meat, such as round, sirloin, rump, and flank (cut extra fat off meat you fix).  Whole grain breads, cereals and pasta.  Skim and nonfat dry milk.  Low-fat yogurt.  Poultry without the skin.  Cheese made with skim or part-skim milk, such as mozzarella, parmesan, farmers', ricotta, or pot cheese. These are higher fat foods. Eat LESS of these:   Whole milk and foods made from whole milk, such as American, blue, cheddar, monterey jack, and swiss cheese  High-fat meats, such as luncheon meats, sausages, knockwurst, bratwurst, hot dogs, ribs, corned beef, ground pork, and regular ground beef.  Fried foods. Limit saturated fats in your diet. Substituting unsaturated fat for saturated fat may decrease your blood triglyceride level. You will need to read package labels to know which products contain saturated fats.  These foods are high in saturated fat. Eat LESS of these:   Fried pork skins.  Whole milk.  Skin and fat from poultry.  Palm oil.  Butter.  Shortening.  Cream cheese.  Bacon.  Margarines and baked goods made from listed oils.  Vegetable shortenings.  Chitterlings.  Fat from meats.  Coconut oil.  Palm kernel oil.  Lard.  Cream.  Sour cream.  Fatback.  Coffee whiteners and non-dairy creamers made with these oils.  Cheese made from whole milk. Use unsaturated fats (both polyunsaturated and monounsaturated) moderately. Remember, even though unsaturated fats are better than saturated fats; you still want a diet low in total fat.  These foods are high in unsaturated fat:   Canola oil.  Sunflower oil.  Mayonnaise.  Almonds.  Peanuts.  Pine nuts.  Margarines made with these oils.  Safflower oil.  Olive oil.  Avocados.  Cashews.  Peanut butter.  Sunflower seeds.  Soybean oil.  Peanut  oil.  Olives.  Pecans.  Walnuts.  Pumpkin seeds. Avoid sugar and other high-sugar foods. This will decrease carbohydrates without decreasing other nutrients. Sugar in your food goes rapidly to your blood. When there is excess sugar in your blood, your liver may use it to make more triglycerides. Sugar also contains calories without other important nutrients.  Eat LESS of these:   Sugar, brown sugar, powdered sugar, jam, jelly, preserves, honey, syrup, molasses, pies, candy, cakes, cookies, frosting, pastries, colas, soft drinks, punches, fruit drinks, and regular gelatin.  Avoid alcohol. Alcohol, even more than sugar, may increase blood triglycerides. In addition, alcohol is high in calories and low in nutrients. Ask for sparkling water, or a diet soft drink instead of an alcoholic beverage. Suggestions for planning and preparing meals   Bake, broil, grill or roast meats instead of frying.  Remove fat from meats and skin from poultry before cooking.  Add spices, herbs, lemon juice or vinegar to vegetables instead of salt, rich sauces or gravies.  Use a non-stick skillet without fat or use no-stick sprays.  Cool and refrigerate stews and broth. Then remove the hardened fat floating on the surface before serving.  Refrigerate meat drippings and skim off fat to make low-fat gravies.  Serve more fish.  Use less butter,   margarine and other high-fat spreads on bread or vegetables.  Use skim or reconstituted non-fat dry milk for cooking.  Cook with low-fat cheeses.  Substitute low-fat yogurt or cottage cheese for all or part of the sour cream in recipes for sauces, dips or congealed salads.  Use half yogurt/half mayonnaise in salad recipes.  Substitute evaporated skim milk for cream. Evaporated skim milk or reconstituted non-fat dry milk can be whipped and substituted for whipped cream in certain recipes.  Choose fresh fruits for dessert instead of high-fat foods such as pies or  cakes. Fruits are naturally low in fat. When Dining Out   Order low-fat appetizers such as fruit or vegetable juice, pasta with vegetables or tomato sauce.  Select clear, rather than cream soups.  Ask that dressings and gravies be served on the side. Then use less of them.  Order foods that are baked, broiled, poached, steamed, stir-fried, or roasted.  Ask for margarine instead of butter, and use only a small amount.  Drink sparkling water, unsweetened tea or coffee, or diet soft drinks instead of alcohol or other sweet beverages. QUESTIONS AND ANSWERS ABOUT OTHER FATS IN THE BLOOD: SATURATED FAT, TRANS FAT, AND CHOLESTEROL What is trans fat? Trans fat is a type of fat that is formed when vegetable oil is hardened through a process called hydrogenation. This process helps makes foods more solid, gives them shape, and prolongs their shelf life. Trans fats are also called hydrogenated or partially hydrogenated oils.  What do saturated fat, trans fat, and cholesterol in foods have to do with heart disease? Saturated fat, trans fat, and cholesterol in the diet all raise the level of LDL "bad" cholesterol in the blood. The higher the LDL cholesterol, the greater the risk for coronary heart disease (CHD). Saturated fat and trans fat raise LDL similarly.  What foods contain saturated fat, trans fat, and cholesterol? High amounts of saturated fat are found in animal products, such as fatty cuts of meat, chicken skin, and full-fat dairy products like butter, whole milk, cream, and cheese, and in tropical vegetable oils such as palm, palm kernel, and coconut oil. Trans fat is found in some of the same foods as saturated fat, such as vegetable shortening, some margarines (especially hard or stick margarine), crackers, cookies, baked goods, fried foods, salad dressings, and other processed foods made with partially hydrogenated vegetable oils. Small amounts of trans fat also occur naturally in some animal  products, such as milk products, beef, and lamb. Foods high in cholesterol include liver, other organ meats, egg yolks, shrimp, and full-fat dairy products. How can I use the new food label to make heart-healthy food choices? Check the Nutrition Facts panel of the food label. Choose foods lower in saturated fat, trans fat, and cholesterol. For saturated fat and cholesterol, you can also use the Percent Daily Value (%DV): 5% DV or less is low, and 20% DV or more is high. (There is no %DV for trans fat.) Use the Nutrition Facts panel to choose foods low in saturated fat and cholesterol, and if the trans fat is not listed, read the ingredients and limit products that list shortening or hydrogenated or partially hydrogenated vegetable oil, which tend to be high in trans fat. POINTS TO REMEMBER:   Discuss your risk for heart disease with your caregivers, and take steps to reduce risk factors.  Change your diet. Choose foods that are low in saturated fat, trans fat, and cholesterol.  Add exercise to your daily routine if   it is not already being done. Participate in physical activity of moderate intensity, like brisk walking, for at least 30 minutes on most, and preferably all days of the week. No time? Break the 30 minutes into three, 10-minute segments during the day.  Stop smoking. If you do smoke, contact your caregiver to discuss ways in which they can help you quit.  Do not use street drugs.  Maintain a normal weight.  Maintain a healthy blood pressure.  Keep up with your blood work for checking the fats in your blood as directed by your caregiver. Document Released: 04/02/2004 Document Revised: 12/15/2011 Document Reviewed: 10/29/2008 [employer] Patient Information 2014 [employer].  

## 2012-12-16 LAB — NMR LIPOPROFILE WITH LIPIDS
Cholesterol, Total: 196 mg/dL (ref <200)
HDL Particle Number: 35.5 umol/L (ref >=30.5)
HDL-C: 42 mg/dL (ref >=40)
LDL (calc): 119 mg/dL — ABNORMAL HIGH (ref <100)
LP-IR Score: 67 — ABNORMAL HIGH (ref <=45)
Triglycerides: 176 mg/dL — ABNORMAL HIGH (ref <150)
VLDL Size: 47.4 nm — ABNORMAL HIGH (ref <=46.6)

## 2013-01-12 ENCOUNTER — Encounter: Payer: Self-pay | Admitting: Radiation Oncology

## 2013-01-12 NOTE — Progress Notes (Addendum)
CC: Dr. Lance Bosch, Dr. Rudi Heap, Dr. Bjorn Pippin  Chart note: The patient would like to have a seed implant. I am informed that he has [facility] which we do not have a contract with. He would be out of network. I suggested that he could have a seed implant in [place] with Dr. Rushie Chestnut, or Dr. Rodena Medin at [facility]. Again, I told him that my recommendation would be for close surveillance/observation. He has not had a MRI scan. I told the patient that I would be more than happy to make arrangements for him to be seen by anyone outside.

## 2013-01-23 ENCOUNTER — Ambulatory Visit: Payer: Medicare HMO | Admitting: Radiation Oncology

## 2013-03-20 ENCOUNTER — Telehealth: Payer: Self-pay | Admitting: Cardiology

## 2013-03-20 NOTE — Telephone Encounter (Signed)
Will forward to Dr Lorane Gell

## 2013-03-20 NOTE — Telephone Encounter (Signed)
New problem   Dr Drevland/[facility] stated pt will have sx on fri 03/24/13,  ekg show that pt is in Atrial Bigeminy and need to know if pt need prior work up before sx on friday. Please call and advise. Will be faxing over notes and EKG today.

## 2013-03-21 NOTE — Telephone Encounter (Signed)
Left message that Dr Antoine Poche will need to see the pt in the office before he can give surgical clearance.  Requested he have pt call for an appointment to be scheduled.  Will also forward this note to schedulers to call pt

## 2013-03-21 NOTE — Telephone Encounter (Signed)
I have not seen the patient in six months.  I am unable to clear him over the phone for surgery on Friday.

## 2013-03-23 NOTE — Telephone Encounter (Signed)
Pt has been scheduled for an appt with Dr Antoine Poche

## 2013-04-07 ENCOUNTER — Encounter: Payer: Self-pay | Admitting: *Deleted

## 2013-04-14 ENCOUNTER — Encounter: Payer: Self-pay | Admitting: Gastroenterology

## 2013-05-03 ENCOUNTER — Ambulatory Visit (INDEPENDENT_AMBULATORY_CARE_PROVIDER_SITE_OTHER): Payer: Medicare HMO | Admitting: Cardiology

## 2013-05-03 ENCOUNTER — Encounter: Payer: Self-pay | Admitting: Cardiology

## 2013-05-03 VITALS — BP 132/86 | HR 74 | Ht 69.0 in | Wt 200.0 lb

## 2013-05-03 DIAGNOSIS — I200 Unstable angina: Secondary | ICD-10-CM

## 2013-05-03 DIAGNOSIS — I2510 Atherosclerotic heart disease of native coronary artery without angina pectoris: Secondary | ICD-10-CM

## 2013-05-03 NOTE — Progress Notes (Signed)
HPI The patient presents for follow up of CAD.  Since I last saw him he's had radiation for laryngeal cancer and is going to have another prostate biopsy for prostate cancer.  However, prior to having this done at [facility], he was noted to have atrial bigeminy. I see if these records and was able to review this. He actually did not notice these palpitations. Since his catheterization most recently he has not had any cardiovascular symptoms he denies chest pressure, neck or arm discomfort. He's not noticed any presyncope or syncope. He does activities such as digging up his garden without complaints.  He's not had any excessive shortness of breath though he's had some chronic dyspnea. He's not had any PND or orthopnea. He unfortunately continues to smoke though he has cut back.  Allergies  Allergen Reactions  . Statins     Current Outpatient Prescriptions  Medication Sig Dispense Refill  . aspirin EC 81 MG tablet Take by mouth daily.      . clopidogrel (PLAVIX) 75 MG tablet Take 1 tablet (75 mg total) by mouth daily.  30 tablet  6  . folic acid (FOLVITE) 400 MCG tablet Take 400 mcg by mouth daily.      . Multiple Vitamins tablet 1 tablet.      . nitroGLYCERIN (NITROSTAT) 0.4 MG SL tablet Place 1 tablet (0.4 mg total) under the tongue every 5 (five) minutes as needed for chest pain (up to 3 doses).  25 tablet  3  . saw palmetto (RA SAW PALMETTO) 80 MG capsule 450 mg.      . tamsulosin (FLOMAX) 0.4 MG CAPS capsule 0.4 mg.       No current facility-administered medications for this visit.    Past Medical History  Diagnosis Date  . Hyperlipidemia     intolerant to statins  . PVD (peripheral vascular disease)     Aortobifemoral Bypass 2010, Thrombectomy and repair right brachial artery   . CAD (coronary artery disease) 12/2008    cath 11/2011 s/p DES to LAD  . CVA (cerebral infarction) 06/2009  . Elevated PSA 02/2012    6.54  . Tobacco abuse   . Unstable angina   . Cancer 06/2012   prostate, larynx  . Radiation 08/10/2012-09/22/2012    6525 cGy to larynx    Past Surgical History  Procedure Laterality Date  . Aortobifemoral bypass  2010  . 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 Larynx biopsy  07/12/12  . Prostate biopsy  07/12/12    Gleason's grade 3+3+6  . Coronary angioplasty with stent placement  11/2011    ROS:  As stated in the HPI and negative for all other systems.  PHYSICAL EXAM BP 132/86  Pulse 74  Ht 5\' 9"  (1.753 m)  Wt 200 lb (90.719 kg)  BMI 29.52 kg/m2GENERAL:  Well appearing HEENT:  Pupils equal round and reactive, fundi not visualized NECK:  No jugular venous distention, waveform within normal limits, carotid upstroke brisk and symmetric, no bruits, no thyromegaly LYMPHATICS:  No cervical, inguinal adenopathy LUNGS:  Clear to auscultation bilaterally CHEST:  Unremarkable HEART:  PMI not displaced or sustained,S1 and S2 within normal limits, no S3, no S4, no clicks, no rubs, no murmurs ABD:  Flat, positive bowel sounds normal in frequency in pitch, no bruits, no rebound, no guarding, no midline pulsatile mass, no hepatomegaly, no splenomegaly EXT:  2 plus pulses throughout,  no edema, no cyanosis no clubbing   EKG: Sinus rhythm, rate 77to, RSR prime V1 and V2, no acute ST-T wave changes. 05/03/2013  ASSESSMENT AND PLAN  CAD -  The patient has no high-risk findings or symptoms. He has a high functional level (greater than 5 METS).  He is going for a procedure that is low risk from a cardiovascular standpoint. Therefore, based on ACC/AHA guidelines, the patient would be at acceptable risk for the planned procedure without further cardiovascular testing.  For now I would like to continue him on the Plavix though it certainly could be an held for any necessary prostate procedures.    PVD -  He will continue with risk reduction.   Hyperlipidemia -  He is intolerant of statins.  His LDL was 152 recently but he is on maximal therapy.  Prostate Cancer -  As above  Tobacco abuse -  He has a severe addiction and reactive to about this many times. At this time does not want further therapies were treatments for this.  PACs -  Given the absence of symptoms no further cardiovascular testing is suggested to change in therapy. Certainly if he develops any dysrhythmia at the time of his seizure this could be managed as needed.

## 2013-05-03 NOTE — Patient Instructions (Signed)
The current medical regimen is effective;  continue present plan and medications.  Follow up in 1 year with Dr Hochrein.  You will receive a letter in the mail 2 months before you are due.  Please call us when you receive this letter to schedule your follow up appointment.  

## 2013-05-19 ENCOUNTER — Telehealth: Payer: Self-pay | Admitting: Cardiology

## 2013-05-19 NOTE — Telephone Encounter (Signed)
Send   Pt calling back with the information on where to send his surg. Clearance:   Dr Earlene Plater Urology dept.  [facility] [employer]. Ph  # [phone] Fax # [phone]

## 2013-05-19 NOTE — Telephone Encounter (Signed)
Called [facility] and got the fax number Called pt - lm that clearance will be faxed today

## 2013-06-23 DIAGNOSIS — L989 Disorder of the skin and subcutaneous tissue, unspecified: Secondary | ICD-10-CM | POA: Insufficient documentation

## 2013-11-16 ENCOUNTER — Ambulatory Visit (INDEPENDENT_AMBULATORY_CARE_PROVIDER_SITE_OTHER): Payer: Self-pay | Admitting: *Deleted

## 2013-11-16 DIAGNOSIS — I639 Cerebral infarction, unspecified: Secondary | ICD-10-CM

## 2013-11-16 DIAGNOSIS — I6350 Cerebral infarction due to unspecified occlusion or stenosis of unspecified cerebral artery: Secondary | ICD-10-CM

## 2013-11-16 NOTE — Progress Notes (Signed)
Participant in the office today for his IRIS 4th. Annual and EXIT visit. MMSE was completed successfully without any problems.  Blood  was drawn and shipped. Participant has been off study drug since June of 2013, because of the Bladder Cancer Risk.  Participant to  continue with follow-up visits with his PCP.  Participant will be  notified of study trial results in late 2015.

## 2013-11-25 DIAGNOSIS — Z8673 Personal history of transient ischemic attack (TIA), and cerebral infarction without residual deficits: Secondary | ICD-10-CM | POA: Insufficient documentation

## 2013-11-25 DIAGNOSIS — I639 Cerebral infarction, unspecified: Secondary | ICD-10-CM | POA: Insufficient documentation

## 2013-11-28 ENCOUNTER — Ambulatory Visit (INDEPENDENT_AMBULATORY_CARE_PROVIDER_SITE_OTHER): Payer: Medicare HMO | Admitting: Neurology

## 2013-11-28 ENCOUNTER — Encounter (INDEPENDENT_AMBULATORY_CARE_PROVIDER_SITE_OTHER): Payer: Self-pay

## 2013-11-28 ENCOUNTER — Encounter: Payer: Self-pay | Admitting: Neurology

## 2013-11-28 VITALS — BP 132/79 | HR 73 | Ht 69.0 in | Wt 199.0 lb

## 2013-11-28 DIAGNOSIS — G459 Transient cerebral ischemic attack, unspecified: Secondary | ICD-10-CM

## 2013-11-28 NOTE — Patient Instructions (Addendum)
I had a long discussion with the patient and his wife regarding his recent TIA, discussed the need for further evaluation with diagnostic testing, secondary stroke prevention strategies and answered questions. Continue an aspirin and Plavix for now but discussed with her his cardiologist to switch Plavix to [place] given its demonstrated stability in acute and secondary cardiac prevention.. I counseled him to quit smoking. Check lipid profile, and an A1c, transthoracic echo, MRI scan of the brain and MRA of the brain and neck. Return for followup in 6 weeks or call earlier if necessary.  Stroke Prevention Some medical conditions and behaviors are associated with an increased chance of having a stroke. You may prevent a stroke by making healthy choices and managing medical conditions. HOW CAN I REDUCE MY RISK OF HAVING A STROKE?   Stay physically active. Get at least 30 minutes of activity on most or all days.  Do not smoke. It may also be helpful to avoid exposure to secondhand smoke.  Limit alcohol use. Moderate alcohol use is considered to be:  No more than 2 drinks per day for men.  No more than 1 drink per day for nonpregnant women.  Eat healthy foods. This involves  Eating 5 or more servings of fruits and vegetables a day.  Following a diet that addresses high blood pressure (hypertension), high cholesterol, diabetes, or obesity.  Manage your cholesterol levels.  A diet low in saturated fat, trans fat, and cholesterol and high in fiber may control cholesterol levels.  Take any prescribed medicines to control cholesterol as directed by your health care provider.  Manage your diabetes.  A controlled-carbohydrate, controlled-sugar diet is recommended to manage diabetes.  Take any prescribed medicines to control diabetes as directed by your health care provider.  Control your hypertension.  A low-salt (sodium), low-saturated fat, low-trans fat, and low-cholesterol diet is  recommended to manage hypertension.  Take any prescribed medicines to control hypertension as directed by your health care provider.  Maintain a healthy weight.  A reduced-calorie, low-sodium, low-saturated fat, low-trans fat, low-cholesterol diet is recommended to manage weight.  Stop drug abuse.  Avoid taking birth control pills.  Talk to your health care provider about the risks of taking birth control pills if you are over 68 years old, smoke, get migraines, or have ever had a blood clot.  Get evaluated for sleep disorders (sleep apnea).  Talk to your health care provider about getting a sleep evaluation if you snore a lot or have excessive sleepiness.  Take medicines as directed by your health care provider.  For some people, aspirin or blood thinners (anticoagulants) are helpful in reducing the risk of forming abnormal blood clots that can lead to stroke. If you have the irregular heart rhythm of atrial fibrillation, you should be on a blood thinner unless there is a good reason you cannot take them.  Understand all your medicine instructions.  Make sure that other other conditions (such as anemia or atherosclerosis) are addressed. SEEK IMMEDIATE MEDICAL CARE IF:   You have sudden weakness or numbness of the face, arm, or leg, especially on one side of the body.  Your face or eyelid droops to one side.  You have sudden confusion.  You have trouble speaking (aphasia) or understanding.  You have sudden trouble seeing in one or both eyes.  You have sudden trouble walking.  You have dizziness.  You have a loss of balance or coordination.  You have a sudden, severe headache with no known cause.  You have new chest pain or an irregular heartbeat. Any of these symptoms may represent a serious problem that is an emergency. Do not wait to see if the symptoms will go away. Get medical help at once. Call your local emergency services  (911 in [place] Do not drive yourself to  the hospital. Document Released: 07/23/2004 Document Revised: 04/05/2013 Document Reviewed: 12/16/2012 [facility] Patient Information 2014 [place].

## 2013-11-28 NOTE — Progress Notes (Signed)
[facility] [address] [place]. [place] [address] [phone]       OFFICE CONSULT NOTE  [name] Date of Birth:  1951/03/14 Medical Record Number:  [id number]   Referring MD:  Deland Pretty  Reason for Referral:   TIA  HPI: 69 year Caucasian male who developed transient episode of bilateral blurred vision and inability to focus while getting out of his truck and shopping at [employer] on 11/23/13. He noticed that his gait and balance also were off but he could walk with some difficulty. After he entered the store he noticed that he could barely see in digestible transport and did not call for help. His vision started improving in about 5 minutes but he noticed that his left-sided coordination was more affected. He was about to walk to the car. He also had some increased subjective numbness on the left side. He was able to drive home and by the time his wife got home his symptoms resolved completely within 2 hours. He denied any complaint headache, double vision, vertigo. He does have prior history of right brain subcortical infarct in 2011 when he saw me for left-sided weakness and paresthesias. MRI scan had shown right internal capsule and thalamic infarct. He did have residual mild left leg weakness clumsiness as well as left hemianesthesia which has persisted. His vascular risk factors were found to be hyperlipidemia but he history of intolerance to multiple statins and has not been on therapy with statins. He has been on tri-the Crixivan Niaspan. He has been on aspirin 81 mg and Plavix preprocedure unstable angina and had cardiac stent a few years ago. He continues to smoke and smokes one pack per day and states his tried to quit multiple times but has been unable to do so. He has not had any recent stroke or TIA workup. He was diagnosed with throat cancer a year ago and underwent radiation to his throat. He was also diagnosed with prostate cancer and is on  treatment. He has not had any recent stroke or TIA workup.  ROS:   14 system review of systems is positive for  Numbness, weakness, gait difficulty, shortness of breath, fatigue and all the systems negative  PMH:  Past Medical History  Diagnosis Date  . Hyperlipidemia     intolerant to statins  . PVD (peripheral vascular disease)     Aortobifemoral Bypass 2010, Thrombectomy and repair right brachial artery   . CAD (coronary artery disease) 12/2008    cath 11/2011 s/p DES to LAD  . CVA (cerebral infarction) 06/2009  . Elevated PSA 02/2012    6.54  . Tobacco abuse   . Unstable angina   . Cancer 06/2012    prostate, larynx  . Radiation 08/10/2012-09/22/2012    6525 cGy to larynx    Social History:  History   Social History  . Marital Status: Married    Spouse Name: cheryl    Number of Children: 3  . Years of Education: N/A   Occupational History  . Not on file.   Social History Main Topics  . Smoking status: Current Some Day Smoker -- 1.00 packs/day for 45 years    Types: Cigarettes    Start date: 10/07/1967  . Smokeless tobacco: Not on file  . Alcohol Use: No     Comment: rarely  . Drug Use: No  . Sexual Activity: Yes   Other Topics Concern  . Not on file   Social History Narrative  .  No narrative on file    Medications:   Current Outpatient Prescriptions on File Prior to Visit  Medication Sig Dispense Refill  . aspirin EC 81 MG tablet Take by mouth daily.      . clopidogrel (PLAVIX) 75 MG tablet Take 1 tablet (75 mg total) by mouth daily.  30 tablet  6  . folic acid (FOLVITE) 322 MCG tablet Take 400 mcg by mouth daily.      . Multiple Vitamins tablet 1 tablet.      . saw palmetto (RA SAW PALMETTO) 80 MG capsule 450 mg.      . tamsulosin (FLOMAX) 0.4 MG CAPS capsule 0.4 mg.      . nitroGLYCERIN (NITROSTAT) 0.4 MG SL tablet Place 1 tablet (0.4 mg total) under the tongue every 5 (five) minutes as needed for chest pain (up to 3 doses).  25 tablet  3   No current  facility-administered medications on file prior to visit.    Allergies:   Allergies  Allergen Reactions  . Statins     Physical Exam General: well developed, well nourished, seated, in no evident distress Head: head normocephalic and atraumatic. Orohparynx benign Neck: supple with no carotid or supraclavicular bruits Cardiovascular: regular rate and rhythm, no murmurs Musculoskeletal: no deformity Skin:  no rash/petichiae Vascular:  Normal pulses all extremities Filed Vitals:   11/28/13 1341  BP: 132/79  Pulse: 73    Neurologic Exam Mental Status: Awake and fully alert. Oriented to place and time. Recent and remote memory intact. Attention span, concentration and fund of knowledge appropriate. Mood and affect appropriate.  Cranial Nerves: Fundoscopic exam reveals sharp disc margins. Pupils equal, briskly reactive to light. Extraocular movements full without nystagmus. Visual fields full to confrontation. Hearing intact. Facial sensation intact. Face, tongue, palate moves normally and symmetrically.  Motor: Normal bulk and tone. Normal strength in all tested extremity muscles on the right side. Mild weakness of left grip and intrinsic hand muscles. Mild weakness of the left ankle dorsiflexors and hip flexors. Increased tone in the left leg. Orbits right over left upper extremity.. Sensory.: Diminished touch and pin prick sensation on the left hemibody including lower face  Coordination: Rapid alternating movements normal in all extremities. Finger-to-nose and heel-to-shin performed accurately bilaterally. Gait and Station: Arises from chair without difficulty. Stance is normal. Gait demonstrates mild dragging of the left foot with heaviness.. Unable to heel, toe and tandem walk without difficulty.  Reflexes: 1+ and symmetric. Toes downgoing.   NIHSS  1 Modified Rankin  2   ASSESSMENT: 34 Caucasian male with transient episode of bilateral blurred vision and left-sided  incoordination and numbness likely posterior dislocation TIA in May 2015 with prior history of right brain subcortical infarct in 2011 and small vessel disease with multiple vascular risk factors spoking, hyperlipidemia, peripheral vascular disease, coronary artery disease, neck radiation for throat cancer and cerebrovascular disease.    PLAN: I had a long discussion with the patient and his wife regarding his recent TIA, discussed the need for further evaluation with diagnostic testing, secondary stroke prevention strategies and answered questions. Continue an aspirin and Plavix for now but discussed with her his cardiologist to switch Plavix to [place] given its demonstrated stability in acute and secondary cardiac prevention.. I counseled him to quit smoking. Check lipid profile, and an A1c, transthoracic echo, MRI scan of the brain and MRA of the brain and neck. Return for followup in 6 weeks or call earlier if necessary.   Note: This document  was prepared with digital dictation and possible smart phrase technology. Any transcriptional errors that result from this process are unintentional.

## 2013-12-01 ENCOUNTER — Ambulatory Visit: Payer: Medicare HMO | Admitting: Neurology

## 2013-12-06 ENCOUNTER — Ambulatory Visit
Admission: RE | Admit: 2013-12-06 | Discharge: 2013-12-06 | Disposition: A | Discharge status: Home / Self Care | Payer: Medicare HMO | Source: Ambulatory Visit | Attending: Neurology | Admitting: Neurology

## 2013-12-06 DIAGNOSIS — G459 Transient cerebral ischemic attack, unspecified: Secondary | ICD-10-CM

## 2013-12-06 MED ORDER — GADOBENATE DIMEGLUMINE 529 MG/ML IV SOLN
19.0000 mL | Freq: Once | INTRAVENOUS | Status: AC | PRN
  Administered 2013-12-06: 19 mL via INTRAVENOUS

## 2013-12-13 ENCOUNTER — Ambulatory Visit (HOSPITAL_COMMUNITY): Payer: Medicare HMO | Attending: Neurology | Admitting: Radiology

## 2013-12-13 DIAGNOSIS — I739 Peripheral vascular disease, unspecified: Secondary | ICD-10-CM | POA: Insufficient documentation

## 2013-12-13 DIAGNOSIS — F17200 Nicotine dependence, unspecified, uncomplicated: Secondary | ICD-10-CM | POA: Insufficient documentation

## 2013-12-13 DIAGNOSIS — G459 Transient cerebral ischemic attack, unspecified: Secondary | ICD-10-CM

## 2013-12-13 DIAGNOSIS — E785 Hyperlipidemia, unspecified: Secondary | ICD-10-CM | POA: Insufficient documentation

## 2013-12-13 NOTE — Progress Notes (Signed)
Echocardiogram performed.  

## 2014-01-11 ENCOUNTER — Telehealth: Payer: Self-pay | Admitting: Cardiology

## 2014-01-11 NOTE — Telephone Encounter (Signed)
Schedule in the next 10 days with me or an APP at [facility] on a day I am there.

## 2014-01-11 NOTE — Telephone Encounter (Signed)
New problem   Pt need to speak to nurse concerning changing his Plavix to [place]. Please call pt concerning matter.

## 2014-01-11 NOTE — Telephone Encounter (Signed)
RN returned call to patient. Patient states that he had a TIA 6 weeks ago and Dr. Leonie Man recommended that he see if Dr. Percival Spanish would change him from Plavix to Brilinta. Patient has no current complaints. RN informed him that Kary Kos is not generic and may cost him more, but that samples are often available.  Informed patient that Dr. Percival Spanish will be notified and he will be contacted with information as soon as it is available.

## 2014-01-12 NOTE — Telephone Encounter (Signed)
Message forwarded to [facility] to schedule an appmt for patient. Patient is aware

## 2014-01-16 ENCOUNTER — Ambulatory Visit (INDEPENDENT_AMBULATORY_CARE_PROVIDER_SITE_OTHER): Payer: Medicare HMO | Admitting: Neurology

## 2014-01-16 ENCOUNTER — Encounter: Payer: Self-pay | Admitting: Neurology

## 2014-01-16 VITALS — BP 111/74 | HR 76 | Ht 69.0 in | Wt 199.4 lb

## 2014-01-16 DIAGNOSIS — I6350 Cerebral infarction due to unspecified occlusion or stenosis of unspecified cerebral artery: Secondary | ICD-10-CM

## 2014-01-16 DIAGNOSIS — I6381 Other cerebral infarction due to occlusion or stenosis of small artery: Secondary | ICD-10-CM

## 2014-01-16 NOTE — Progress Notes (Signed)
[facility] [address] [place]. [place] [address] [phone]       OFFICE FOLLOW UP VISIT  NOTE  [name] Date of Birth:  1951-01-20 Medical Record Number:  [id number]   Referring MD:  Deland Pretty  Reason for Referral:   TIA  HPI: 70 year Caucasian male who developed transient episode of bilateral blurred vision and inability to focus while getting out of his truck and shopping at [employer] on 11/23/13. He noticed that his gait and balance also were off but he could walk with some difficulty. After he entered the store he noticed that he could barely see in digestible transport and did not call for help. His vision started improving in about 5 minutes but he noticed that his left-sided coordination was more affected. He was about to walk to the car. He also had some increased subjective numbness on the left side. He was able to drive home and by the time his wife got home his symptoms resolved completely within 2 hours. He denied any complaint headache, double vision, vertigo. He does have prior history of right brain subcortical infarct in 2011 when he saw me for left-sided weakness and paresthesias. MRI scan had shown right internal capsule and thalamic infarct. He did have residual mild left leg weakness clumsiness as well as left hemianesthesia which has persisted. His vascular risk factors were found to be hyperlipidemia but he history of intolerance to multiple statins and has not been on therapy with statins. He has been on tri-the Crixivan Niaspan. He has been on aspirin 81 mg and Plavix preprocedure unstable angina and had cardiac stent a few years ago. He continues to smoke and smokes one pack per day and states his tried to quit multiple times but has been unable to do so. He has not had any recent stroke or TIA workup. He was diagnosed with throat cancer a year ago and underwent radiation to his throat. He was also diagnosed with prostate cancer and is  on treatment. He has not had any recent stroke or TIA workup. Update 01/16/2014 : He returns for followup of her initial consultation on 11/28/13. He'll undergone several tests and is keen to discuss results. Transversely echo done on 12/13/13 showed normal ejection fraction without cardiac source of embolism. The patient chose not to have lipid profile and hemoglobin A1c checked but states his ring to do that today. MRA of the brain 12/06/13 was normal. MRA of the neck showed no significant extracranial stenosis. MRI scan of the brain shows remote age right thalamic infarct and moderate changes of chronic microvascular ischemia and severe changes of paranasal sinusitis. Compared with previous MRI from 1/30//2011 this represented more advanced changes. Have personally reviewed the films and discuss results with the patient and wife. And answered questions. He has not had any recurrent neurological symptoms and remained stable. He remains on aspirin and Plavix but does have an upcoming visit with his cardiologist and will discuss switching Plavix to prevent a. Have also advised him to consider the new non-statin lipid-lowering injectable medicine Praluent if his lipid profile comes elevated ROS:   14 system review of systems is positive fatigue, shortness of breath, headache, muscle cramps, gait difficulty, neck pain and all other systems negative PMH:  Past Medical History  Diagnosis Date  . Hyperlipidemia     intolerant to statins  . PVD (peripheral vascular disease)     Aortobifemoral Bypass 2010, Thrombectomy and repair right brachial artery   .  CAD (coronary artery disease) 12/2008    cath 11/2011 s/p DES to LAD  . CVA (cerebral infarction) 06/2009  . Elevated PSA 02/2012    6.54  . Tobacco abuse   . Unstable angina   . Cancer 06/2012    prostate, larynx  . Radiation 08/10/2012-09/22/2012    6525 cGy to larynx    Social History:  History   Social History  . Marital Status: Married    Spouse  Name: cheryl    Number of Children: 3  . Years of Education: N/A   Occupational History  . Not on file.   Social History Main Topics  . Smoking status: Current Some Day Smoker -- 1.00 packs/day for 45 years    Types: Cigarettes    Start date: 10/07/1967  . Smokeless tobacco: Not on file  . Alcohol Use: No     Comment: rarely  . Drug Use: No  . Sexual Activity: Yes   Other Topics Concern  . Not on file   Social History Narrative   Patient lives with his wife   Patient right handed    Medications:   Current Outpatient Prescriptions on File Prior to Visit  Medication Sig Dispense Refill  . aspirin EC 81 MG tablet Take by mouth daily.      . clopidogrel (PLAVIX) 75 MG tablet Take 1 tablet (75 mg total) by mouth daily.  30 tablet  6  . folic acid (FOLVITE) 427 MCG tablet Take 400 mcg by mouth daily.      . Multiple Vitamins tablet 1 tablet.      . nitroGLYCERIN (NITROSTAT) 0.4 MG SL tablet Place 1 tablet (0.4 mg total) under the tongue every 5 (five) minutes as needed for chest pain (up to 3 doses).  25 tablet  3  . saw palmetto (RA SAW PALMETTO) 80 MG capsule 450 mg.      . tamsulosin (FLOMAX) 0.4 MG CAPS capsule 0.4 mg.       No current facility-administered medications on file prior to visit.    Allergies:   Allergies  Allergen Reactions  . Statins     Physical Exam General: well developed, well nourished, seated, in no evident distress Head: head normocephalic and atraumatic. Orohparynx benign Neck: supple with no carotid or supraclavicular bruits Cardiovascular: regular rate and rhythm, no murmurs Musculoskeletal: no deformity Skin:  no rash/petichiae Vascular:  Normal pulses all extremities Filed Vitals:   01/16/14 1544  BP: 111/74  Pulse: 76    Neurologic Exam Mental Status: Awake and fully alert. Oriented to place and time. Recent and remote memory intact. Attention span, concentration and fund of knowledge appropriate. Mood and affect appropriate.    Cranial Nerves: Fundoscopic exam reveals sharp disc margins. Pupils equal, briskly reactive to light. Extraocular movements full without nystagmus. Visual fields full to confrontation. Hearing intact. Facial sensation intact. Face, tongue, palate moves normally and symmetrically.  Motor: Normal bulk and tone. Normal strength in all tested extremity muscles on the right side. Mild weakness of left grip and intrinsic hand muscles. Mild weakness of the left ankle dorsiflexors and hip flexors. Increased tone in the left leg. Orbits right over left upper extremity.. Sensory.: Diminished touch and pin prick sensation on the left hemibody including lower face  Coordination: Rapid alternating movements normal in all extremities. Finger-to-nose and heel-to-shin performed accurately bilaterally. Gait and Station: Arises from chair without difficulty. Stance is normal. Gait demonstrates mild dragging of the left foot with heaviness.. Unable to heel, toe  and tandem walk without difficulty.  Reflexes: 1+ and symmetric. Toes downgoing.   NIHSS  1 Modified Rankin  2   ASSESSMENT: 76 Caucasian male with transient episode of bilateral blurred vision and left-sided incoordination and numbness likely posterior dislocation TIA in May 2015 with prior history of right brain subcortical infarct in 2011 and small vessel disease with multiple vascular risk factors spoking, hyperlipidemia, peripheral vascular disease, coronary artery disease, neck radiation for throat cancer and cerebrovascular disease.    PLAN: Continue aspirin for stroke prevention. Consider discussion with cardiologist to change Plavix to prevent her given separately for cardiac prevention. Strict control of lipids with LDL cholesterol goal below 100 mg percent. Check fasting lipid profile and hemoglobin A 1C. Consider changing to new injectable non-statin cholesterol medication Praluent recently approved. Return for followup in 6 months with Charlott Holler,  NP  or call earlier if necessary  Note: This document was prepared with digital dictation and possible smart phrase technology. Any transcriptional errors that result from this process are unintentional.

## 2014-01-16 NOTE — Patient Instructions (Signed)
Continue aspirin for stroke prevention. Consider discussion with cardiologist to change Plavix to prevent her given separately for cardiac prevention. Strict control of lipids with LDL cholesterol goal below 100 mg percent. Check fasting lipid profile and hemoglobin A 1C. Consider changing to new injectable non-statin cholesterol medication recently approved. Return for followup in 6 months with Charlott Holler, NP  or call earlier if necessary

## 2014-02-08 NOTE — Telephone Encounter (Signed)
error 

## 2014-02-14 ENCOUNTER — Ambulatory Visit (INDEPENDENT_AMBULATORY_CARE_PROVIDER_SITE_OTHER): Payer: Medicare HMO | Admitting: Cardiology

## 2014-02-14 ENCOUNTER — Encounter: Payer: Self-pay | Admitting: Cardiology

## 2014-02-14 VITALS — BP 143/93 | HR 87 | Ht 69.0 in | Wt 198.0 lb

## 2014-02-14 DIAGNOSIS — I2510 Atherosclerotic heart disease of native coronary artery without angina pectoris: Secondary | ICD-10-CM

## 2014-02-14 MED ORDER — ROSUVASTATIN CALCIUM 10 MG PO TABS: 10.0000 mg | ORAL_TABLET | ORAL | Status: DC

## 2014-02-14 MED ORDER — NITROGLYCERIN 0.4 MG SL SUBL: 0.4000 mg | SUBLINGUAL_TABLET | SUBLINGUAL | Status: DC | PRN

## 2014-02-14 MED ORDER — TICAGRELOR 90 MG PO TABS: 90.0000 mg | ORAL_TABLET | Freq: Two times a day (BID) | ORAL | Status: DC

## 2014-02-14 NOTE — Progress Notes (Signed)
HPI The patient presents for follow up of CAD.  He was actually referred back after a probable TIA. His other neurologist and I reviewed these notes. The question was whether he could be treated with Brilinta instead of Plavix.   Of note an echocardiogram was relatively unremarkable and there was no source of embolism. He's had MRI and MRA. I reviewed these results.  He's not having any neurologic complaints now. He's not having any palpitations, presyncope or syncope. He's had no chest pressure, neck or arm discomfort. He's had no new shortness of breath, PND or orthopnea.   He does have some chronic dyspnea from his smoking. Is having ongoing followup of his PSA and his laryngeal cancer.  Allergies  Allergen Reactions  . Statins     Current Outpatient Prescriptions  Medication Sig Dispense Refill  . aspirin EC 81 MG tablet Take by mouth daily.      . clopidogrel (PLAVIX) 75 MG tablet Take 1 tablet (75 mg total) by mouth daily.  30 tablet  6  . folic acid (FOLVITE) 229 MCG tablet Take 400 mcg by mouth daily.      . Multiple Vitamins tablet 1 tablet.      . saw palmetto (RA SAW PALMETTO) 80 MG capsule 450 mg.      . tamsulosin (FLOMAX) 0.4 MG CAPS capsule 0.4 mg daily.       . nitroGLYCERIN (NITROSTAT) 0.4 MG SL tablet Place 1 tablet (0.4 mg total) under the tongue every 5 (five) minutes as needed for chest pain (up to 3 doses).  25 tablet  prn   No current facility-administered medications for this visit.    Past Medical History  Diagnosis Date  . Hyperlipidemia     intolerant to statins  . PVD (peripheral vascular disease)     Aortobifemoral Bypass 2010, Thrombectomy and repair right brachial artery   . CAD (coronary artery disease) 12/2008    cath 11/2011 s/p DES to LAD  . CVA (cerebral infarction) 06/2009  . Elevated PSA 02/2012    6.54  . Tobacco abuse   . Unstable angina   . Cancer 06/2012    prostate, larynx  . Radiation 08/10/2012-09/22/2012    6525 cGy to larynx    Past  Surgical History  Procedure Laterality Date  . Aortobifemoral bypass  2010  . 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 Larynx biopsy  07/12/12  . Prostate biopsy  07/12/12    Gleason's grade 3+3+6  . Coronary angioplasty with stent placement  11/2011    ROS:  As stated in the HPI and negative for all other systems.  PHYSICAL EXAM BP 143/93  Pulse 87  Ht 5\' 9"  (1.753 m)  Wt 198 lb (89.812 kg)  BMI 29.23 kg/m2GENERAL:  Well appearing HEENT:  Pupils equal round and reactive, fundi not visualized NECK:  No jugular venous distention, waveform within normal limits, carotid upstroke brisk and symmetric, no bruits, no thyromegaly LYMPHATICS:  No cervical, inguinal adenopathy LUNGS:  Clear to auscultation bilaterally CHEST:  Unremarkable HEART:  PMI not displaced or sustained,S1 and S2 within normal limits, no S3, no S4, no clicks, no rubs, no murmurs ABD:  Flat, positive bowel sounds normal in frequency in pitch, no bruits, no rebound, no guarding, no midline pulsatile mass, no hepatomegaly, no splenomegaly EXT:  2 plus pulses throughout, no edema, no cyanosis no clubbing   EKG: Sinus rhythm,  rate 79, RSR prime V1 and V2, no acute ST-T wave changes, left axis deviation, nonspecific diffuse class.. 02/14/2014  ASSESSMENT AND PLAN  TIA - Given his recent TIA and a suggestion by Dr. Leonie Man I will go ahead and switch him to [place].  He will continue the meds as listed.  CAD -  The patient has no new sypmtoms.  No further cardiovascular testing is indicated.  We will continue with aggressive risk reduction and meds as listed.  PVD -  He will continue with risk reduction and followup with Dr. Oneida Alar.   Hyperlipidemia -  The most recent lipids and I have is an LDL of 152. However, he had labs drawn recently by his primary provider and I will check these. He has been intolerant of statins in the past by his report. With  this he can at least tolerate 10 mg of Crestor weekly which should give [place] a 20% reduction in LDL. In 6 weeks we will check the cholesterol again. If his LDL is not less than 100 with an ideal of less than 70 I will start him on alirocumab.   Tobacco abuse -  He has a severe addiction and we have talked about this many times. At this time he does not want further therapies were treatments for this.

## 2014-02-14 NOTE — Patient Instructions (Signed)
Please start Crestor 10 mg once a week, Start Brilinta 90 mg one twice a day. Stop Plavix. Continue all other medications as listed.  Please have fasting blood work in 6 to 8 weeks (Lipid) with results faxed to Dr Percival Spanish - [phone].  Follow up in 6 months with Dr. Percival Spanish in the [place] office.  You will receive a letter in the mail 2 months before you are due.  Please call us when you receive this letter to schedule your follow up appointment.

## 2014-05-16 ENCOUNTER — Encounter: Payer: Self-pay | Admitting: Neurology

## 2014-06-07 ENCOUNTER — Encounter (HOSPITAL_COMMUNITY): Payer: Self-pay | Admitting: Cardiology

## 2014-07-19 ENCOUNTER — Ambulatory Visit: Payer: Medicare HMO | Admitting: Nurse Practitioner

## 2014-08-01 ENCOUNTER — Ambulatory Visit: Payer: Medicare HMO | Admitting: Adult Health

## 2014-08-16 ENCOUNTER — Encounter: Payer: Self-pay | Admitting: Adult Health

## 2014-08-16 ENCOUNTER — Ambulatory Visit (INDEPENDENT_AMBULATORY_CARE_PROVIDER_SITE_OTHER): Payer: Medicare HMO | Admitting: Adult Health

## 2014-08-16 VITALS — BP 151/83 | HR 74 | Ht 68.0 in | Wt 192.0 lb

## 2014-08-16 DIAGNOSIS — R531 Weakness: Secondary | ICD-10-CM

## 2014-08-16 DIAGNOSIS — M6289 Other specified disorders of muscle: Secondary | ICD-10-CM

## 2014-08-16 DIAGNOSIS — I6930 Unspecified sequelae of cerebral infarction: Secondary | ICD-10-CM

## 2014-08-16 NOTE — Patient Instructions (Addendum)
Continue aspirin and plavix for stroke prevention.Strict control of lipids with LDL cholesterol goal below 100 mg percent. Keep blood pressure < 130/90. Stop smoking- Participate in the smoking cessation program with the [place].

## 2014-08-16 NOTE — Progress Notes (Signed)
PATIENT: [name] DOB: [birth year]  REASON FOR VISIT: follow up-history of cva, left side weakness HISTORY FROM: patient  HISTORY OF PRESENT ILLNESS: [name] is a 64 year old male with a history of stroke. He returns today for follow-up. The patient is currently taking aspirin for stroke prevention. He was recently switched from Plavix to [place]. He was unable to afford the Brilinta so he went back to Plavix. The patient's blood pressure has been controlled. Today it is slightly elevated. Patient states that it was due to him coughing. He just had his BP checked  at his PCP and it was 117/77.  The patient is currently not taking anything except Niacin  for his cholesterol, unable to tolerate statins. Patient had blood drawn last week at the [facility] hospital to check lipid levels. After the stroke the patient was left with residual weakness on the left side. Patient's gait has remained the same. He has a cane but does not use it often. Patient does smoke but is be enrolled in a smoking cessation program by the [place]. Patient denies any additional stroke like symptoms.  HISTORY 01/16/14 (SETHI): 71 year Caucasian male who developed transient episode of bilateral blurred vision and inability to focus while getting out of his truck and shopping at [employer] on 11/23/13. He noticed that his gait and balance also were off but he could walk with some difficulty. After he entered the store he noticed that he could barely see in digestible transport and did not call for help. His vision started improving in about 5 minutes but he noticed that his left-sided coordination was more affected. He was about to walk to the car. He also had some increased subjective numbness on the left side. He was able to drive home and by the time his wife got home his symptoms resolved completely within 2 hours. He denied any complaint headache, double vision, vertigo. He does have prior history of right brain subcortical  infarct in 2011 when he saw me for left-sided weakness and paresthesias. MRI scan had shown right internal capsule and thalamic infarct. He did have residual mild left leg weakness clumsiness as well as left hemianesthesia which has persisted. His vascular risk factors were found to be hyperlipidemia but he history of intolerance to multiple statins and has not been on therapy with statins. He has been on tri-the Crixivan Niaspan. He has been on aspirin 81 mg and Plavix preprocedure unstable angina and had cardiac stent a few years ago. He continues to smoke and smokes one pack per day and states his tried to quit multiple times but has been unable to do so. He has not had any recent stroke or TIA workup. He was diagnosed with throat cancer a year ago and underwent radiation to his throat. He was also diagnosed with prostate cancer and is on treatment. He has not had any recent stroke or TIA workup. Update 01/16/2014 : He returns for followup of her initial consultation on 11/28/13. He'll undergone several tests and is keen to discuss results. Transversely echo done on 12/13/13 showed normal ejection fraction without cardiac source of embolism. The patient chose not to have lipid profile and hemoglobin A1c checked but states his ring to do that today. MRA of the brain 12/06/13 was normal. MRA of the neck showed no significant extracranial stenosis. MRI scan of the brain shows remote age right thalamic infarct and moderate changes of chronic microvascular ischemia and severe changes of paranasal sinusitis. Compared with previous  MRI from 1/30//2011 this represented more advanced changes. Have personally reviewed the films and discuss results with the patient and wife. And answered questions. He has not had any recurrent neurological symptoms and remained stable. He remains on aspirin and Plavix but does have an upcoming visit with his cardiologist and will discuss switching Plavix to prevent a. Have also advised him to  consider the new non-statin lipid-lowering injectable medicine Praluent if his lipid profile comes elevated  REVIEW OF SYSTEMS: Out of a complete 14 system review of symptoms, the patient complains only of the following symptoms, and all other reviewed systems are negative.  Chest pain, murmur, eye discharge, eye itching difficulty urinating, frequency of urination, muscle cramps ALLERGIES: Allergies  Allergen Reactions  . Statins     HOME MEDICATIONS: Outpatient Prescriptions Prior to Visit  Medication Sig Dispense Refill  . aspirin EC 81 MG tablet Take by mouth daily.    . folic acid (FOLVITE) 160 MCG tablet Take 400 mcg by mouth daily.    . Multiple Vitamins tablet 1 tablet.    . nitroGLYCERIN (NITROSTAT) 0.4 MG SL tablet Place 1 tablet (0.4 mg total) under the tongue every 5 (five) minutes as needed for chest pain (up to 3 doses). 25 tablet prn  . rosuvastatin (CRESTOR) 10 MG tablet Take 1 tablet (10 mg total) by mouth once a week. 12 tablet 3  . saw palmetto (RA SAW PALMETTO) 80 MG capsule 450 mg.    . tamsulosin (FLOMAX) 0.4 MG CAPS capsule 0.4 mg daily.     . ticagrelor (BRILINTA) 90 MG TABS tablet Take 1 tablet (90 mg total) by mouth 2 (two) times daily. 180 tablet 3   No facility-administered medications prior to visit.    PAST MEDICAL HISTORY: Past Medical History  Diagnosis Date  . Hyperlipidemia     intolerant to statins  . PVD (peripheral vascular disease)     Aortobifemoral Bypass 2010, Thrombectomy and repair right brachial artery   . CAD (coronary artery disease) 12/2008    cath 11/2011 s/p DES to LAD  . CVA (cerebral infarction) 06/2009  . Elevated PSA 02/2012    6.54  . Tobacco abuse   . Unstable angina   . Cancer 06/2012    prostate, larynx  . Radiation 08/10/2012-09/22/2012    6525 cGy to larynx    PAST SURGICAL HISTORY: Past Surgical History  Procedure Laterality Date  . Aortobifemoral bypass  2010  . 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 Larynx biopsy  07/12/12  . Prostate biopsy  07/12/12    Gleason's grade 3+3+6  . Coronary angioplasty with stent placement  11/2011  . Left heart catheterization with coronary angiogram N/A 12/17/2011    Procedure: LEFT HEART CATHETERIZATION WITH CORONARY ANGIOGRAM;  Surgeon: Hillary Bow, MD;  Location[facility] C[facility] LAB;  Service: Cardiovascular;  Laterality: N/A;  . Percutaneous coronary stent intervention (pci-s) N/A 12/17/2011    Procedure: PERCUTANEOUS CORONARY STENT INTERVENTION (PCI-S);  Surgeon: Hillary Bow, MD;  Location: [facility] CATH LAB;  Service: Cardiovascular;  Laterality: N/A;    FAMILY HISTORY: Family History  Problem Relation Age of Onset  . Coronary artery disease Father 65  . Heart disease Father   . Coronary artery disease Brother 70    died with AMI  . Heart disease Brother   . Breast cancer Mother          PHYSICAL EXAM  Filed Vitals:   08/16/14 0930  BP: 151/83  Pulse: 74  Height: 5\' 8"  (1.727 m)  Weight: 192 lb (87.091 kg)   Body mass index is 29.2 kg/(m^2).  Generalized: Well developed, in no acute distress   Neurological examination  Mentation: Alert oriented to time, place, history taking. Follows all commands speech and language fluent Cranial nerve II-XII: Pupils were equal round reactive to light. Extraocular movements were full, visual field were full on confrontational test. Facial sensation and strength were normal. Uvula tongue midline. Head turning and shoulder shrug  were normal and symmetric. Motor: The motor testing reveals 5 over 5 strength in the right upper and lower extremities. 4/5 strength in the left upper and lower extremities. Decreased hand grip in the left.Kermit Balo symmetric motor tone is noted throughout.  Sensory: Sensory testing is intact to soft touch on all 4 extremities. No evidence of extinction is noted.  Coordination: Cerebellar testing reveals good  finger-nose-finger and heel-to-shin bilaterally.  Gait and station: Patient has a limp on the left side when ambulating. Tandem gait is unsteady. Romberg is negative. No drift is seen.  Reflexes: Deep tendon reflexes are symmetric and normal bilaterally.   DIAGNOSTIC DATA (LABS, IMAGING, TESTING) - I reviewed patient records, labs, notes, testing and imaging myself where available.   ASSESSMENT AND PLAN 64 y.o. year old male  has a past medical history of Hyperlipidemia; PVD (peripheral vascular disease); CAD (coronary artery disease) (12/2008); CVA (cerebral infarction) (06/2009); Elevated PSA (02/2012); Tobacco abuse; Unstable angina; Cancer (06/2012); and Radiation (08/10/2012-09/22/2012). here with:  1. History of CVA  Continue aspirin and plavix for stroke prevention.  Strict control of lipids with LDL cholesterol goal below 100 mg percent. Strict control of blood pressure. Goal <130/90. Stop smoking- should participate in smoking cessation program offered by the [place]. If the patient has any additional stroke like symptoms he should go to the ED immediately. He verbalized understanding.  Return for followup in 6 months   Ward Givens, MSN, NP-C 08/16/2014, 9:33 AM [facility] [address] [place], [place] [address] [phone]  Note: This document was prepared with digital dictation and possible smart phrase technology. Any transcriptional errors that result from this process are unintentional.

## 2014-09-04 ENCOUNTER — Other Ambulatory Visit (HOSPITAL_COMMUNITY): Payer: Self-pay

## 2014-09-04 ENCOUNTER — Encounter (HOSPITAL_COMMUNITY): Payer: Self-pay | Admitting: Emergency Medicine

## 2014-09-04 ENCOUNTER — Inpatient Hospital Stay (HOSPITAL_COMMUNITY)
Admission: EM | Admit: 2014-09-04 | Discharge: 2014-09-06 | DRG: 247 | Disposition: A | Discharge status: Home / Self Care | Payer: Medicare HMO | Attending: Cardiovascular Disease | Admitting: Cardiovascular Disease

## 2014-09-04 ENCOUNTER — Encounter (HOSPITAL_COMMUNITY)
Admission: EM | Disposition: A | Discharge status: Home / Self Care | Payer: Medicare HMO | Source: Home / Self Care | Attending: Cardiovascular Disease

## 2014-09-04 DIAGNOSIS — E785 Hyperlipidemia, unspecified: Secondary | ICD-10-CM | POA: Diagnosis present

## 2014-09-04 DIAGNOSIS — J449 Chronic obstructive pulmonary disease, unspecified: Secondary | ICD-10-CM | POA: Diagnosis present

## 2014-09-04 DIAGNOSIS — Z955 Presence of coronary angioplasty implant and graft: Secondary | ICD-10-CM

## 2014-09-04 DIAGNOSIS — Z7982 Long term (current) use of aspirin: Secondary | ICD-10-CM | POA: Diagnosis not present

## 2014-09-04 DIAGNOSIS — I70203 Unspecified atherosclerosis of native arteries of extremities, bilateral legs: Secondary | ICD-10-CM | POA: Diagnosis present

## 2014-09-04 DIAGNOSIS — I25110 Atherosclerotic heart disease of native coronary artery with unstable angina pectoris: Secondary | ICD-10-CM | POA: Diagnosis not present

## 2014-09-04 DIAGNOSIS — Z720 Tobacco use: Secondary | ICD-10-CM | POA: Diagnosis present

## 2014-09-04 DIAGNOSIS — Z8673 Personal history of transient ischemic attack (TIA), and cerebral infarction without residual deficits: Secondary | ICD-10-CM | POA: Diagnosis not present

## 2014-09-04 DIAGNOSIS — F17210 Nicotine dependence, cigarettes, uncomplicated: Secondary | ICD-10-CM | POA: Diagnosis present

## 2014-09-04 DIAGNOSIS — I2102 ST elevation (STEMI) myocardial infarction involving left anterior descending coronary artery: Principal | ICD-10-CM

## 2014-09-04 DIAGNOSIS — Z79899 Other long term (current) drug therapy: Secondary | ICD-10-CM | POA: Diagnosis not present

## 2014-09-04 DIAGNOSIS — I255 Ischemic cardiomyopathy: Secondary | ICD-10-CM | POA: Diagnosis present

## 2014-09-04 DIAGNOSIS — Z8249 Family history of ischemic heart disease and other diseases of the circulatory system: Secondary | ICD-10-CM

## 2014-09-04 DIAGNOSIS — I10 Essential (primary) hypertension: Secondary | ICD-10-CM | POA: Diagnosis present

## 2014-09-04 DIAGNOSIS — I2582 Chronic total occlusion of coronary artery: Secondary | ICD-10-CM | POA: Diagnosis present

## 2014-09-04 DIAGNOSIS — Z8546 Personal history of malignant neoplasm of prostate: Secondary | ICD-10-CM | POA: Diagnosis not present

## 2014-09-04 DIAGNOSIS — R079 Chest pain, unspecified: Secondary | ICD-10-CM | POA: Diagnosis not present

## 2014-09-04 DIAGNOSIS — C61 Malignant neoplasm of prostate: Secondary | ICD-10-CM | POA: Diagnosis present

## 2014-09-04 DIAGNOSIS — I2109 ST elevation (STEMI) myocardial infarction involving other coronary artery of anterior wall: Secondary | ICD-10-CM

## 2014-09-04 DIAGNOSIS — Z7902 Long term (current) use of antithrombotics/antiplatelets: Secondary | ICD-10-CM

## 2014-09-04 DIAGNOSIS — I2510 Atherosclerotic heart disease of native coronary artery without angina pectoris: Secondary | ICD-10-CM

## 2014-09-04 HISTORY — PX: LEFT HEART CATHETERIZATION WITH CORONARY ANGIOGRAM: SHX5451

## 2014-09-04 LAB — POCT I-STAT, CHEM 8
BUN: 13 mg/dL (ref 6–23)
Calcium, Ion: 1.18 mmol/L (ref 1.13–1.30)
Chloride: 104 mmol/L (ref 96–112)
Creatinine, Ser: 0.9 mg/dL (ref 0.50–1.35)
Glucose, Bld: 112 mg/dL — ABNORMAL HIGH (ref 70–99)
HCT: 46 % (ref 39.0–52.0)
Hemoglobin: 15.6 g/dL (ref 13.0–17.0)
Potassium: 3.1 mmol/L — ABNORMAL LOW (ref 3.5–5.1)
Sodium: 140 mmol/L (ref 135–145)
TCO2: 15 mmol/L (ref 0–100)

## 2014-09-04 LAB — POCT I-STAT TROPONIN I: Troponin i, poc: 0.03 ng/mL (ref 0.00–0.08)

## 2014-09-04 LAB — TROPONIN I: Troponin I: 0.04 ng/mL — ABNORMAL HIGH (ref <0.031)

## 2014-09-04 LAB — CK TOTAL AND CKMB (NOT AT ARMC)
CK, MB: 2.1 ng/mL (ref 0.3–4.0)
Relative Index: INVALID (ref 0.0–2.5)
Total CK: 57 U/L (ref 7–232)

## 2014-09-04 LAB — POCT ACTIVATED CLOTTING TIME: Activated Clotting Time: 411 seconds

## 2014-09-04 LAB — MRSA PCR SCREENING: MRSA by PCR: NEGATIVE

## 2014-09-04 SURGERY — LEFT HEART CATHETERIZATION WITH CORONARY ANGIOGRAM
Anesthesia: LOCAL

## 2014-09-04 MED ORDER — ASPIRIN EC 81 MG PO TBEC
81.0000 mg | DELAYED_RELEASE_TABLET | Freq: Every day | ORAL | Status: DC
  Filled 2014-09-04: qty 1

## 2014-09-04 MED ORDER — ACETAMINOPHEN 325 MG PO TABS: 650.0000 mg | ORAL_TABLET | ORAL | Status: DC | PRN

## 2014-09-04 MED ORDER — HEPARIN (PORCINE) IN NACL 2-0.9 UNIT/ML-% IJ SOLN
INTRAMUSCULAR | Status: AC
  Filled 2014-09-04: qty 500

## 2014-09-04 MED ORDER — NITROGLYCERIN 1 MG/10 ML FOR IR/CATH LAB
INTRA_ARTERIAL | Status: AC
  Filled 2014-09-04: qty 10

## 2014-09-04 MED ORDER — HEPARIN SODIUM (PORCINE) 5000 UNIT/ML IJ SOLN
4000.0000 [IU] | Freq: Once | INTRAMUSCULAR | Status: AC
  Administered 2014-09-04: 4000 [IU] via INTRAVENOUS

## 2014-09-04 MED ORDER — BIVALIRUDIN 250 MG IV SOLR
INTRAVENOUS | Status: AC
  Filled 2014-09-04: qty 250

## 2014-09-04 MED ORDER — LIDOCAINE HCL (PF) 1 % IJ SOLN
INTRAMUSCULAR | Status: AC
  Filled 2014-09-04: qty 30

## 2014-09-04 MED ORDER — ONDANSETRON HCL 4 MG/2ML IJ SOLN: 4.0000 mg | Freq: Four times a day (QID) | INTRAMUSCULAR | Status: DC | PRN

## 2014-09-04 MED ORDER — MIDAZOLAM HCL 2 MG/2ML IJ SOLN
INTRAMUSCULAR | Status: AC
  Filled 2014-09-04: qty 2

## 2014-09-04 MED ORDER — TICAGRELOR 90 MG PO TABS
90.0000 mg | ORAL_TABLET | Freq: Two times a day (BID) | ORAL | Status: DC
  Administered 2014-09-05 – 2014-09-06 (×4): 90 mg via ORAL
  Filled 2014-09-04 (×5): qty 1

## 2014-09-04 MED ORDER — SODIUM CHLORIDE 0.9 % IV SOLN
INTRAVENOUS | Status: DC
  Administered 2014-09-04 – 2014-09-05 (×2): via INTRAVENOUS

## 2014-09-04 MED ORDER — HEPARIN SODIUM (PORCINE) 5000 UNIT/ML IJ SOLN
INTRAMUSCULAR | Status: AC
  Filled 2014-09-04: qty 1

## 2014-09-04 MED ORDER — MORPHINE SULFATE 2 MG/ML IJ SOLN
2.0000 mg | INTRAMUSCULAR | Status: DC | PRN
  Administered 2014-09-06: 2 mg via INTRAVENOUS
  Filled 2014-09-04: qty 1

## 2014-09-04 MED ORDER — HEPARIN SODIUM (PORCINE) 5000 UNIT/ML IJ SOLN: 4000.0000 [IU] | Freq: Once | INTRAMUSCULAR | Status: DC

## 2014-09-04 MED ORDER — SODIUM CHLORIDE 0.9 % IV SOLN
1.7500 mg/kg/h | INTRAVENOUS | Status: AC
  Administered 2014-09-04: 1.75 mg/kg/h via INTRAVENOUS
  Filled 2014-09-04: qty 250

## 2014-09-04 MED ORDER — HEPARIN (PORCINE) IN NACL 2-0.9 UNIT/ML-% IJ SOLN
INTRAMUSCULAR | Status: AC
  Filled 2014-09-04: qty 1000

## 2014-09-04 MED ORDER — SODIUM CHLORIDE 0.9 % IV SOLN
1.7500 mg/kg/h | INTRAVENOUS | Status: AC
  Administered 2014-09-04: 1.75 mg/kg/h via INTRAVENOUS
  Filled 2014-09-04 (×2): qty 250

## 2014-09-04 MED ORDER — ATROPINE SULFATE 0.1 MG/ML IJ SOLN
INTRAMUSCULAR | Status: AC
  Filled 2014-09-04: qty 10

## 2014-09-04 MED ORDER — TICAGRELOR 90 MG PO TABS
ORAL_TABLET | ORAL | Status: AC
  Filled 2014-09-04: qty 2

## 2014-09-04 MED ORDER — FENTANYL CITRATE 0.05 MG/ML IJ SOLN
INTRAMUSCULAR | Status: AC
  Filled 2014-09-04: qty 2

## 2014-09-04 MED ORDER — ASPIRIN EC 81 MG PO TBEC
81.0000 mg | DELAYED_RELEASE_TABLET | Freq: Every day | ORAL | Status: DC
  Administered 2014-09-05 – 2014-09-06 (×2): 81 mg via ORAL
  Filled 2014-09-04 (×2): qty 1

## 2014-09-04 NOTE — Progress Notes (Signed)
[place] Progress Note Patient Name: [name] DOB: 24-Aug-1950 MRN: [id number]   Date of Service  09/04/2014  HPI/Events of Note  Uncomplicated STEMI, post PCI  eICU Interventions  Care plan reviewed. No changes made. Will monitor     Intervention Category Evaluation Type: New Patient Evaluation  Merton Border 09/04/2014, 5:33 PM

## 2014-09-04 NOTE — ED Provider Notes (Signed)
CSN: [id number]     Arrival date & time 09/04/14  1346 History   None    Chief Complaint  Patient presents with  . Code STEMI     (Consider location/radiation/quality/duration/timing/severity/associated sxs/prior Treatment) HPI Comments: The patient is a 64 year old male, known cardiac disease with a prior stents to his left anterior descending, treated for hypertension and currently takes Plavix. He states that he was in his yard working on his garden today when he developed acute onset of central chest pain felt as a heaviness radiating to his bilateral arms at the elbow making him feel heavy and weak. The symptoms were severe, persistent, despite giving aspirin and nitroglycerin and route his symptoms are persistent though improved. Paramedics reported that the patient was diaphoretic, no tachycardia or hypotension. He states he had a prior stent several years ago.  The history is provided by the patient.    Past Medical History  Diagnosis Date  . Hyperlipidemia     intolerant to statins  . PVD (peripheral vascular disease)     Aortobifemoral Bypass 2010, Thrombectomy and repair right brachial artery   . CAD (coronary artery disease) 12/2008    cath 11/2011 s/p DES to LAD  . CVA (cerebral infarction) 06/2009  . Elevated PSA 02/2012    6.54  . Tobacco abuse   . Unstable angina   . Cancer 06/2012    prostate, larynx  . Radiation 08/10/2012-09/22/2012    6525 cGy to larynx   Past Surgical History  Procedure Laterality Date  . Aortobifemoral bypass  2010  . 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 Larynx biopsy  07/12/12  . Prostate biopsy  07/12/12    Gleason's grade 3+3+6  . Coronary angioplasty with stent placement  11/2011  . Left heart catheterization with coronary angiogram N/A 12/17/2011    Procedure: LEFT HEART CATHETERIZATION WITH CORONARY ANGIOGRAM;  Surgeon: Hillary Bow, MD;  Location: [facility] CATH LAB;   Service: Cardiovascular;  Laterality: N/A;  . Percutaneous coronary stent intervention (pci-s) N/A 12/17/2011    Procedure: PERCUTANEOUS CORONARY STENT INTERVENTION (PCI-S);  Surgeon: Hillary Bow, MD;  Location: [facility] CATH LAB;  Service: Cardiovascular;  Laterality: N/A;   Family History  Problem Relation Age of Onset  . Coronary artery disease Father 74  . Heart disease Father   . Coronary artery disease Brother 21    died with AMI  . Heart disease Brother   . Breast cancer Mother    History  Substance Use Topics  . Smoking status: Current Some Day Smoker -- 1.00 packs/day for 45 years    Types: Cigarettes    Start date: 10/07/1967  . Smokeless tobacco: Not on file  . Alcohol Use: No     Comment: rarely    Review of Systems  All other systems reviewed and are negative.     Allergies  Statins  Home Medications   Prior to Admission medications   Medication Sig Start Date End Date Taking? Authorizing Provider  aspirin EC 81 MG tablet Take by mouth daily.    Historical Provider, MD  clopidogrel (PLAVIX) 75 MG tablet Take 75 mg by mouth daily.    Historical Provider, MD  folic acid (FOLVITE) 326 MCG tablet Take 400 mcg by mouth daily.    Historical Provider, MD  Multiple Vitamins tablet 1 tablet.    Historical Provider, MD  niacin 250 MG tablet Take 250 mg  by mouth at bedtime.    Historical Provider, MD  nitroGLYCERIN (NITROSTAT) 0.4 MG SL tablet Place 1 tablet (0.4 mg total) under the tongue every 5 (five) minutes as needed for chest pain (up to 3 doses). 02/14/14 07/01/15  Minus Breeding, MD  saw palmetto (RA SAW PALMETTO) 80 MG capsule 450 mg.    Historical Provider, MD  tamsulosin (FLOMAX) 0.4 MG CAPS capsule 0.4 mg daily.  01/25/13   Historical Provider, MD   BP 125/82 mmHg  Pulse 57  Temp(Src) 97.7 F (36.5 C) (Oral)  Resp 16  SpO2 100% Physical Exam  Constitutional: He appears well-developed and well-nourished. He appears distressed.  HENT:  Head: Normocephalic  and atraumatic.  Mouth/Throat: Oropharynx is clear and moist. No oropharyngeal exudate.  Eyes: Conjunctivae and EOM are normal. Pupils are equal, round, and reactive to light. Right eye exhibits no discharge. Left eye exhibits no discharge. No scleral icterus.  Neck: Normal range of motion. Neck supple. No JVD present. No thyromegaly present.  Cardiovascular: Normal rate, regular rhythm, normal heart sounds and intact distal pulses.  Exam reveals no gallop and no friction rub.   No murmur heard. Pulmonary/Chest: Effort normal and breath sounds normal. No respiratory distress. He has no wheezes. He has no rales.  Abdominal: Soft. Bowel sounds are normal. He exhibits no distension and no mass. There is no tenderness.  Musculoskeletal: Normal range of motion. He exhibits no edema or tenderness.  Lymphadenopathy:    He has no cervical adenopathy.  Neurological: He is alert. Coordination normal.  Skin: Skin is warm. No rash noted. He is diaphoretic. No erythema.  Psychiatric: He has a normal mood and affect. His behavior is normal.  Nursing note and vitals reviewed.   ED Course  Procedures (including critical care time) Labs Review Labs Reviewed  POCT I-STAT, CHEM 8 - Abnormal; Notable for the following:    Potassium 3.1 (*)    Glucose, Bld 112 (*)    All other components within normal limits  I-STAT CHEM 8, ED  I-STAT TROPOININ, ED  POCT I-STAT TROPONIN I    Imaging Review No results found.   ED ECG REPORT  I personally interpreted this EKG   Date: 09/04/2014   Rate: 57  Rhythm: normal sinus rhythm  QRS Axis: normal  Intervals: normal  ST/T Wave abnormalities: acute myocardial infarction  Conduction Disutrbances:none  Narrative Interpretation:   Old EKG Reviewed: changes noted  MDM   Final diagnoses:  ST elevation myocardial infarction involving left anterior descending (LAD) coronary artery    The patient is diaphoretic, he appears uncomfortable, his vital signs are  unremarkable except for a pulse of 57, he is not hypotensive, his physical exam shows no murmurs or other signs of heart failure, he has no peripheral edema or JVD. His EKG does show a STEMI  The patient was given heparin, 2 IVs, Cath Lab was activated immediately and the patient was transferred within 5 minutes to the catheterization lab.      Noemi Chapel, MD 09/04/14 [phone]

## 2014-09-04 NOTE — Progress Notes (Signed)
Femoral arterial sheath pulled at 2200.  Manual pressure applied for 25 minutes until hemostasis achieved.  Vital signs remained stable with no complications, groin site level 0, pressure dressing applied.  Pt given post sheath pull education and stated understanding.  Bedrest per orders.  Will continue to monitor closley

## 2014-09-04 NOTE — ED Notes (Signed)
Has had Baby ASA x 4mg , NTG 3 sl-- per  [facility]--   Pt to ED via [facility]--- was working in yard, pain midsternal, radiating to back,

## 2014-09-04 NOTE — H&P (Cosign Needed)
HPI:  64 y/o male with h/o HTN, HL (intolerant of statins), CAD s/p DES to LAD in 2013. COPD with ongoing tobacco use, h/o CVA 2011, PAD s/p aorto bifm bypass presents with anterior STEMI.  Was feeling ok until 1245p. Acute onset of CP. Pain persisted. Called EMS. Received 3 NTG with minimal relief. ECG with anterior ST elevation. Code STEMI activated.   On plavix regularly but did not take this am. No bleeding. No recent surgery. No HF.   Review of Systems:     Cardiac Review of Systems: {Y] = yes [ ]  = no  Chest Pain [ y   ]  Resting SOB [   ] Exertional SOB  [ y ]  Orthopnea [  ]   Pedal Edema [   ]    Palpitations [  ] Syncope  [  ]   Presyncope [   ]  General Review of Systems: [Y] = yes [  ]=no Constitional: recent weight change [  ]; anorexia [  ]; fatigue [ y ]; nausea [  ]; night sweats [  ]; fever [  ]; or chills [  ];                                                                     Dental: poor dentition[  ];   Eye : blurred vision [  ]; diplopia [   ]; vision changes [  ];  Amaurosis fugax[  ]; Resp: cough Blue.Reese  ];  wheezing[  ];  hemoptysis[  ]; shortness of breath[  ]; paroxysmal nocturnal dyspnea[  ]; dyspnea on exertion[  ]; or orthopnea[  ];  GI:  gallstones[  ], vomiting[  ];  dysphagia[  ]; melena[  ];  hematochezia [  ]; heartburn[  ];   GU: kidney stones [  ]; hematuria[  ];   dysuria [  ];  nocturia[  ];               Skin: rash [  ], swelling[  ];, hair loss[  ];  peripheral edema[  ];  or itching[  ]; Musculosketetal: myalgias[  ];  joint swelling[  ];  joint erythema[  ];  joint pain[y  ];  back pain[ y ];  Heme/Lymph: bruising[  ];  bleeding[  ];  anemia[  ];  Neuro: TIA[  ];  headaches[  ];  stroke[y  ];  vertigo[  ];  seizures[  ];   paresthesias[  ];  difficulty walking[y  ];  Psych:depression[ y ]; anxiety[  ];  Endocrine: diabetes[  ];  thyroid dysfunction[  ];  Other:  Past Medical History  Diagnosis Date  . Hyperlipidemia     intolerant to  statins  . PVD (peripheral vascular disease)     Aortobifemoral Bypass 2010, Thrombectomy and repair right brachial artery   . CAD (coronary artery disease) 12/2008    cath 11/2011 s/p DES to LAD  . CVA (cerebral infarction) 06/2009  . Elevated PSA 02/2012    6.54  . Tobacco abuse   . Unstable angina   . Cancer 06/2012    prostate, larynx  . Radiation 08/10/2012-09/22/2012    6525 cGy to larynx  Prior to Admission medications   Medication Sig Start Date End Date Taking? Authorizing Provider  aspirin EC 81 MG tablet Take by mouth daily.    Historical Provider, MD  clopidogrel (PLAVIX) 75 MG tablet Take 75 mg by mouth daily.    Historical Provider, MD  folic acid (FOLVITE) 568 MCG tablet Take 400 mcg by mouth daily.    Historical Provider, MD  Multiple Vitamins tablet 1 tablet.    Historical Provider, MD  niacin 250 MG tablet Take 250 mg by mouth at bedtime.    Historical Provider, MD  nitroGLYCERIN (NITROSTAT) 0.4 MG SL tablet Place 1 tablet (0.4 mg total) under the tongue every 5 (five) minutes as needed for chest pain (up to 3 doses). 02/14/14 07/01/15  Minus Breeding, MD  saw palmetto (RA SAW PALMETTO) 80 MG capsule 450 mg.    Historical Provider, MD  tamsulosin (FLOMAX) 0.4 MG CAPS capsule 0.4 mg daily.  01/25/13   Historical Provider, MD      Allergies  Allergen Reactions  . Statins     History   Social History  . Marital Status: Married    Spouse Name: cheryl  . Number of Children: 3  . Years of Education: N/A   Occupational History  . Not on file.   Social History Main Topics  . Smoking status: Current Some Day Smoker -- 1.00 packs/day for 45 years    Types: Cigarettes    Start date: 10/07/1967  . Smokeless tobacco: Not on file  . Alcohol Use: No     Comment: rarely  . Drug Use: No  . Sexual Activity: Yes   Other Topics Concern  . Not on file   Social History Narrative   Patient lives with his wife   Patient right handed    Family History  Problem  Relation Age of Onset  . Coronary artery disease Father 60  . Heart disease Father   . Coronary artery disease Brother 18    died with AMI  . Heart disease Brother   . Breast cancer Mother     PHYSICAL EXAM: Filed Vitals:   09/04/14 1357  BP: 125/82  Pulse: 57  Temp: 97.7 F (36.5 C)  Resp: 16   General:  Lying in bed. Moderate discomforrt No respiratory difficulty HEENT: normal Neck: supple. no JVD. Carotids 2+ bilat; no bruits. No lymphadenopathy or thryomegaly appreciated. Cor: PMI nondisplaced. Distant HSRegular rate & rhythm.  Lungs: clear with decreased BS throughout Abdomen: soft, nontender, nondistended. No hepatosplenomegaly. No bruits or masses. Good bowel sounds. Extremities: no cyanosis, clubbing, rash, edema Neuro: alert & oriented x 3, cranial nerves grossly intact. moves all 4 extremities w/o difficulty. Affect pleasant.  ECG: NSR with anterior ST elevation and inferior depression  No results found for this or any previous visit (from the past 24 hour(s)). No results found.   ASSESSMENT:  1. Acute anterior MI 2. CAD s/p previous DES to LAD 6/13 3. COPD with ongoing tobacco use 4. HL with intolerance to statins 5. PAD 6. H/o CVA 2011  PLAN/DISCUSSION:  Patient taken to cath lab for emergent cath with Dr. Claiborne Billings. Heparin given. Has been on Plavix will check P2Y12 level. Unable to tolerate statins can consider PCSK9 inhibitor. Counseled on need to stop smoking.   Keauna Brasel,MD 2:16 PM

## 2014-09-04 NOTE — CV Procedure (Signed)
[name] is a 64 y.o. male    [id number]  [id number] LOCATION:  FACILITY: [place]  PHYSICIAN: Troy Sine, MD, [facility] 1950-09-14   DATE OF PROCEDURE:  09/04/2014    EMERGENT CARDIAC CATHETERIZATION/PERCUTANEOUS CORONARY INTERVENTION     HISTORY:    [name] is a 64 y.o. male who has known coronary artery disease and underwent stenting of his LAD in 2013 with a Promus premier DES stent.  Patient has a history of prostate cancer as well as throat cancer and is status post radiation to his larynx.  He is status post aortobifemoral bypass and remotely had undergone thrombectomy and brachial artery repair.  He continues to smoke cigarettes.  He has a history of statin allergy or intolerance.  He has been taking Plavix but admits that he did not take it this morning.  He developed acute onset of chest pain and presented to the emergency room where he was felt to have an ST segment elevation anterior wall myocardial infarction.  A code STEMI was activated and he was taken emergently up to the catheterization laboratory for emergent catheterization.                         PROCEDURE:  Left heart catheterization: coronary angiography, left ventriculography,  Percutaneous coronary intervention to the LAD with PTCA, runs of thrombectomy, and  DES stenting   The patient was brought to the [facility] cardiac catherization laboratory  from the emergency room with residual 5 out of 10 chest pain.  He  was premedicated with Versed 2 mg and fentanyl 50 g..His  right groin was prepped and shaved in usual sterile fashion. Xylocaine 1% was used for local anesthesia. A 6 French sheath was inserted into the R femoral artery. Diagnostic catheterizatiion was done with 5 [place] FL4, FR4 catheters.  With the demonstration of total occlusion of the proximal LAD before the stent and TIMI 0 flow emergent percutaneous coronary intervention was performed.  A 6 French XB LAD guiding catheter was used for the  procedure.  Angiomax bolus plus infusion was administered and the ACT was documented at 411.  Brilinta 180 mg was administered orally.  A choice PT moderate support wire was advanced into the LAD.  This was unable to cross the occlusion by itself, but was successful 2.012 mm balloon.  This was able to cross the total occlusion and multiple dilatations were made with the 2.0 x 12 balloon.  Once there was some flow  It was apparent that there was extensive thrombus burden.  A fetch 2 thrombectomy catheter was in inserted.  The initial catheter had a kink and  this was removed.  There was back up difficulties and ultimately the wire had pulled back.  Since a Prowater had already been openedthis was inserted after guide access was obtained but the Prowater could not recross the site that again had reoccluded.  This was removed and a Fielder XT wire was inserted and was able to cross the lesion and advanced distally.  Repeat dilatation was made with the 2.0 balloon.  Due to significant thrombus a  fetch 2 thrombectomy catheter was inserted. Four runs were made with significant removal of thrombus material.  There still was thrombus and haziness proximal to the stented segment.  A Euphora 2.515 mm balloon was inserted and 4 inflations at 10 atm were mad. A Synergy 3.0 x 20 mm DES stent was inserted and the distal portion of the stent  was positioned in the proximal third of the remotely placed stent  prior to the takeoff of the previously jailed diagonal, so as not to jail this any further.  The proximal portion of the stent extended proximal to the septal perforating artery to cover the entire stenosed segment.  This was dilated at 12 and 14 atm.  An [place] Euphora 3.25 15 mm balloon was used for post stent dilatation with post stent dilatation at 3.25 proximally to 3.21 distally.  Angiography confirmed an excellent result.  The wire balloon and guide were removed.  A 5 French pigtail catheter was used for left  ventriculography.  The arterial sheath was sutured in place.  With the significant thrombus burden, the patient will be maintained on Angiomax for 4 hours post procedure.  The patient left the catheterization laboratory chest pain-free with stable hemodynamics.     HEMODYNAMICS:   Central Aorta: 95/54   Left Ventricle: 95/18  ANGIOGRAPHY:  Left main: Moderate size vessel which trifurcated into a large LAD and ramus intermediate vessel and an AV groove circumflex coronary artery.   LAD: Large vessel proximally that was totally occluded proximal to the previously placed LAD stent.  There was TIMI 0 flow.  Ramus Intermediate: Moderate size angiographically normal vessel which gave rise to several branches and extended to the apex  Left circumflex: Small AV groove circumflex with 40% mid narrowing.   Right coronary artery: Large caliber RCA with 30% mid narrowing.  The vessel supplying the PDA and PLA.    PCI: Following PTCA, for runs of thrombectomy, and stenting with a 3.020 mm Synergy DES stent postdilated to 3.25 mm, the 100% occlusion was reduced to 0%.  Extensive thrombus was removed with thrombectomy was resolved post stenting with resumption of TIMI-3 flow.  100% occlusion was reduced to 0%.  Left ventriculography revealed mild acute LV dysfunction with an ejection fraction of 40% with severe hypokinesis involving the mid distal anterolateral wall and apex.   IMPRESSION:  Anterior ST segment elevation myocardial infarction secondary to total proximal LAD occlusion prior to the previously placed LAD stent.  Mild concomitant CAD with 40% AV groove circumflex stenosis and 30% mid RCA stenosis and a normal moderate size ramus intermediate vessel.  Successful emergent PCI of the totally occluded LAD, treated with PTCA, for runs of thrombectomy with significant clot removal, and stenting with a Synergy DES 3.020 mm stent, postdilated to 3.25 mm with the 100% occlusion being reduced  to 0% and with resumption of TIMI-3 flow.  RECOMMENDATION:  The patient presented with the ST segment elevation myocardial infarction having been on Plavix although admits to only missing this morning's dose.  Recommend long-term DAPT therapy with aspirin/Brilinta per Plato trial with ultimate reduced dose after one year for Pegasys trial.  Smoking cessation is imperative.  The patient states that he cannot take a statin.  Alternative therapy will be necessary including Zetia and possible PCSK9 inhibition.      Troy Sine, MD, [facility] 09/04/2014 4:07 PM

## 2014-09-04 NOTE — ED Notes (Signed)
CODE STEMI CALLED. 

## 2014-09-05 ENCOUNTER — Encounter (HOSPITAL_COMMUNITY): Payer: Self-pay | Admitting: Cardiovascular Disease

## 2014-09-05 DIAGNOSIS — I2510 Atherosclerotic heart disease of native coronary artery without angina pectoris: Secondary | ICD-10-CM

## 2014-09-05 DIAGNOSIS — E785 Hyperlipidemia, unspecified: Secondary | ICD-10-CM

## 2014-09-05 DIAGNOSIS — I10 Essential (primary) hypertension: Secondary | ICD-10-CM

## 2014-09-05 LAB — BASIC METABOLIC PANEL
Anion gap: 7 (ref 5–15)
BUN: 10 mg/dL (ref 6–23)
CO2: 24 mmol/L (ref 19–32)
Calcium: 8.4 mg/dL (ref 8.4–10.5)
Chloride: 109 mmol/L (ref 96–112)
Creatinine, Ser: 0.93 mg/dL (ref 0.50–1.35)
GFR calc Af Amer: >90 mL/min (ref >90)
GFR calc non Af Amer: 88 mL/min — ABNORMAL LOW (ref >90)
GLUCOSE: 114 mg/dL — AB (ref 70–99)
Potassium: 3.3 mmol/L — ABNORMAL LOW (ref 3.5–5.1)
Sodium: 140 mmol/L (ref 135–145)

## 2014-09-05 LAB — CBC
HEMATOCRIT: 39 % (ref 39.0–52.0)
HEMOGLOBIN: 13.3 g/dL (ref 13.0–17.0)
MCH: 29.6 pg (ref 26.0–34.0)
MCHC: 34.1 g/dL (ref 30.0–36.0)
MCV: 86.9 fL (ref 78.0–100.0)
PLATELETS: 188 10*3/uL (ref 150–400)
RBC: 4.49 MIL/uL (ref 4.22–5.81)
RDW: 15 % (ref 11.5–15.5)
WBC: 8.1 10*3/uL (ref 4.0–10.5)

## 2014-09-05 LAB — TROPONIN I
Troponin I: 62.37 ng/mL (ref <0.031)
Troponin I: 51.86 ng/mL (ref <0.031)
Troponin I: 42.62 ng/mL (ref <0.031)

## 2014-09-05 MED ORDER — TAMSULOSIN HCL 0.4 MG PO CAPS
0.4000 mg | ORAL_CAPSULE | Freq: Every day | ORAL | Status: DC
  Administered 2014-09-05 – 2014-09-06 (×2): 0.4 mg via ORAL
  Filled 2014-09-05 (×2): qty 1

## 2014-09-05 MED ORDER — EZETIMIBE 10 MG PO TABS
10.0000 mg | ORAL_TABLET | Freq: Every day | ORAL | Status: DC
  Administered 2014-09-05 – 2014-09-06 (×2): 10 mg via ORAL
  Filled 2014-09-05 (×2): qty 1

## 2014-09-05 MED ORDER — POTASSIUM CHLORIDE CRYS ER 20 MEQ PO TBCR
40.0000 meq | EXTENDED_RELEASE_TABLET | Freq: Once | ORAL | Status: AC
  Administered 2014-09-05: 40 meq via ORAL
  Filled 2014-09-05: qty 2

## 2014-09-05 MED ORDER — METOPROLOL TARTRATE 12.5 MG HALF TABLET
12.5000 mg | ORAL_TABLET | Freq: Two times a day (BID) | ORAL | Status: DC
  Filled 2014-09-05 (×3): qty 1

## 2014-09-05 MED ORDER — ASPIRIN EC 81 MG PO TBEC
81.0000 mg | DELAYED_RELEASE_TABLET | Freq: Once | ORAL | Status: AC
  Administered 2014-09-05: 81 mg via ORAL
  Filled 2014-09-05: qty 1

## 2014-09-05 MED ORDER — NIACIN ER 250 MG PO CPCR
250.0000 mg | ORAL_CAPSULE | Freq: Every day | ORAL | Status: DC
  Administered 2014-09-05: 250 mg via ORAL
  Filled 2014-09-05 (×3): qty 1

## 2014-09-05 MED FILL — Sodium Chloride IV Soln 0.9%: INTRAVENOUS | Qty: 50 | Status: AC

## 2014-09-05 NOTE — Care Management Note (Signed)
    Page 1 of 1   09/05/2014     11:21:10 AM CARE MANAGEMENT NOTE 09/05/2014  Patient:  [name], [name]   Account Number:  [id number]  Date Initiated:  09/05/2014  Documentation initiated by:  Elissa Hefty  Subjective/Objective Assessment:   adm w stemi     Action/Plan:   lives w wife, pcp dr Lynelle Smoke spear   Anticipated DC Date:     Anticipated DC Plan:  [place]  CM consult  Medication Assistance      Choice offered to / List presented to:             Status of service:   Medicare Important Message given?   (If response is "NO", the following Medicare IM given date fields will be blank) Date Medicare IM given:   Medicare IM given by:   Date Additional Medicare IM given:   Additional Medicare IM given by:    Discharge Disposition:    Per UR Regulation:  Reviewed for med. necessity/level of care/duration of stay  If discussed at [place] of Stay Meetings, dates discussed:    Comments:  3/9 1118 debbie Latayvia Mandujano rn,bsn pt has 47.00 per month copay w [employer]. he know he cannot afford the copay. only income is his and he supports he and wife. he has been on plavix since last stent now new stent. have given pt 30day free and have faxed brilinta pt assist form w letter from pt to see if he can get thru drug company.vet adm will not cover brilinta as they refused after last stent.

## 2014-09-05 NOTE — Progress Notes (Signed)
  Echocardiogram 2D Echocardiogram has been performed.  [name] 09/05/2014, 12:06 PM

## 2014-09-05 NOTE — Progress Notes (Signed)
CARDIAC REHAB PHASE I   PRE:  Rate/Rhythm: 20 SR  BP:  Supine: 116/76  Sitting:   Standing:    SaO2:   MODE:  Ambulation: 550 ft   POST:  Rate/Rhythm: 76  BP:  Supine:   Sitting: 128/80  Standing:    SaO2: 97%RA 1435-1520 Pt walked 550 ft on RA with steady gait. No CP. MI education completed with pt who voiced understanding. Pt has brilinta booklet. Stressed importance of brilinta with stent and gave stent booklet. Reviewed NTG use, diet, ex ed, risk factors. Gave pt fake cigarette and smoking cessation handouts. Pt stated he has tried acupuncture, hypnosis, chantix and nicotine patches. Stated he is supposed to go to smoking cessation classes at the [place] in [place]. Discussed CRP 2 but pt declined as stated too far to drive.  Tolerated walk well.   Graylon Good, RN BSN  09/05/2014 3:15 PM

## 2014-09-05 NOTE — Progress Notes (Signed)
Subjective:  No CP/SOB, POD# 1 Ant STEMI  Objective:  Temp:  [97.5 F (36.4 C)-98.7 F (37.1 C)] 98.7 F (37.1 C) (03/09 0730) Pulse Rate:  [57-78] 61 (03/09 0813) Resp:  [11-22] 16 (03/09 0813) BP: (87-155)/(53-95) 99/55 mmHg (03/09 0813) SpO2:  [90 %-100 %] 96 % (03/09 0813) Weight:  [192 lb (87.091 kg)-194 lb 0.1 oz (88 kg)] 194 lb 0.1 oz (88 kg) (03/08 1645) Weight change:   Intake/Output from previous day: 03/08 0701 - 03/09 0700 In: 1729.2 [P.O.:240; I.V.:1489.2] Out: 1325 [Urine:1325]  Intake/Output from this shift:    Physical Exam: General appearance: alert and no distress Neck: no adenopathy, no carotid bruit, no JVD, supple, symmetrical, trachea midline and thyroid not enlarged, symmetric, no tenderness/mass/nodules Lungs: clear to auscultation bilaterally Heart: regular rate and rhythm, S1, S2 normal, no murmur, click, rub or gallop Extremities: extremities normal, atraumatic, no cyanosis or edema and Right groin fem puncture site OK  Lab Results: Results for orders placed or performed during the hospital encounter of 09/04/14 (from the past 48 hour(s))  Troponin I (serum)     Status: Abnormal   Collection Time: 09/04/14  2:02 PM  Result Value Ref Range   Troponin I 0.04 (H) <0.031 ng/mL    Comment:        PERSISTENTLY INCREASED TROPONIN VALUES IN THE RANGE OF 0.04-0.49 ng/mL CAN BE SEEN IN:       -UNSTABLE ANGINA       -CONGESTIVE HEART FAILURE       -MYOCARDITIS       -CHEST TRAUMA       -ARRYHTHMIAS       -LATE PRESENTING MYOCARDIAL INFARCTION       -COPD   CLINICAL FOLLOW-UP RECOMMENDED.   CK total and CKMB     Status: None   Collection Time: 09/04/14  2:02 PM  Result Value Ref Range   Total CK 57 7 - 232 U/L   CK, MB 2.1 0.3 - 4.0 ng/mL   Relative Index RELATIVE INDEX IS INVALID 0.0 - 2.5    Comment: WHEN CK < 100 U/L          POCT i-Stat troponin I     Status: None   Collection Time: 09/04/14  2:07 PM  Result Value Ref Range   Troponin i, poc 0.03 0.00 - 0.08 ng/mL   Comment 3            Comment: Due to the release kinetics of cTnI, a negative result within the first hours of the onset of symptoms does not rule out myocardial infarction with certainty. If myocardial infarction is still suspected, repeat the test at appropriate intervals.   I-STAT, chem 8     Status: Abnormal   Collection Time: 09/04/14  2:08 PM  Result Value Ref Range   Sodium 140 135 - 145 mmol/L   Potassium 3.1 (L) 3.5 - 5.1 mmol/L   Chloride 104 96 - 112 mmol/L   BUN 13 6 - 23 mg/dL   Creatinine, Ser 0.90 0.50 - 1.35 mg/dL   Glucose, Bld 112 (H) 70 - 99 mg/dL   Calcium, Ion 1.18 1.13 - 1.30 mmol/L   TCO2 15 0 - 100 mmol/L   Hemoglobin 15.6 13.0 - 17.0 g/dL   HCT 46.0 39.0 - 52.0 %  POCT Activated clotting time     Status: None   Collection Time: 09/04/14  2:32 PM  Result Value Ref Range   Activated Clotting Time  411 seconds  MRSA PCR Screening     Status: None   Collection Time: 09/04/14  4:34 PM  Result Value Ref Range   MRSA by PCR NEGATIVE NEGATIVE    Comment:        The GeneXpert MRSA Assay (FDA approved for NASAL specimens only), is one component of a comprehensive MRSA colonization surveillance program. It is not intended to diagnose MRSA infection nor to guide or monitor treatment for MRSA infections.   Basic metabolic panel     Status: Abnormal   Collection Time: 09/05/14  3:40 AM  Result Value Ref Range   Sodium 140 135 - 145 mmol/L   Potassium 3.3 (L) 3.5 - 5.1 mmol/L   Chloride 109 96 - 112 mmol/L   CO2 24 19 - 32 mmol/L   Glucose, Bld 114 (H) 70 - 99 mg/dL   BUN 10 6 - 23 mg/dL   Creatinine, Ser 0.93 0.50 - 1.35 mg/dL   Calcium 8.4 8.4 - 10.5 mg/dL   GFR calc non Af Amer 88 (L) >90 mL/min   GFR calc Af Amer >90 >90 mL/min    Comment: (NOTE) The eGFR has been calculated using the CKD EPI equation. This calculation has not been validated in all clinical situations. eGFR's persistently <90 mL/min signify  possible Chronic Kidney Disease.    Anion gap 7 5 - 15  CBC     Status: None   Collection Time: 09/05/14  3:40 AM  Result Value Ref Range   WBC 8.1 4.0 - 10.5 K/uL   RBC 4.49 4.22 - 5.81 MIL/uL   Hemoglobin 13.3 13.0 - 17.0 g/dL   HCT 39.0 39.0 - 52.0 %   MCV 86.9 78.0 - 100.0 fL   MCH 29.6 26.0 - 34.0 pg   MCHC 34.1 30.0 - 36.0 g/dL   RDW 15.0 11.5 - 15.5 %   Platelets 188 150 - 400 K/uL    Imaging: Imaging results have been reviewed  Tele- NSR  Assessment/Plan:   1. Active Problems: 2.   ST elevation myocardial infarction involving left anterior descending (LAD) coronary artery 3.   STEMI (ST elevation myocardial infarction) 4. HTN 5. HLD 6. CVA 7. PVD  Time Spent Directly with Patient:  20 minutes  Length of Stay:  LOS: 1 day   POD # 1 Ant STEMI Rx with direct intervention by Dr. Claiborne Billings with aspiration thrombectomy, PCI/ re stenting with DES. Pt continues to smoke and appears recalcitrant to CRF modification. No CP. Exam benign. Labs OK (k low-----> replete). On DAPT (Brilenta). Start low dose BB. Check 2D for LV fxn. Cycle enz. OK to Tx to 3W/ [facility]. Home 48 hours. ROV with Dr. Percival Spanish.   Lorretta Harp 09/05/2014, 8:42 AM

## 2014-09-06 ENCOUNTER — Other Ambulatory Visit: Payer: Self-pay | Admitting: Physician Assistant

## 2014-09-06 ENCOUNTER — Encounter (HOSPITAL_COMMUNITY): Payer: Self-pay | Admitting: Physician Assistant

## 2014-09-06 DIAGNOSIS — I255 Ischemic cardiomyopathy: Secondary | ICD-10-CM

## 2014-09-06 MED ORDER — NITROGLYCERIN 0.4 MG SL SUBL: 0.4000 mg | SUBLINGUAL_TABLET | SUBLINGUAL | Status: DC | PRN

## 2014-09-06 MED ORDER — TICAGRELOR 90 MG PO TABS: 90.0000 mg | ORAL_TABLET | Freq: Two times a day (BID) | ORAL | Status: DC

## 2014-09-06 MED ORDER — METOPROLOL TARTRATE 25 MG PO TABS: 12.5000 mg | ORAL_TABLET | Freq: Two times a day (BID) | ORAL | Status: DC

## 2014-09-06 MED ORDER — ASPIRIN EC 81 MG PO TBEC: 81.0000 mg | DELAYED_RELEASE_TABLET | Freq: Every day | ORAL | Status: DC

## 2014-09-06 MED ORDER — EZETIMIBE 10 MG PO TABS: 10.0000 mg | ORAL_TABLET | Freq: Every day | ORAL | Status: DC

## 2014-09-06 NOTE — Progress Notes (Addendum)
Patient ID: [name], male   DOB: 1950-08-19, 64 y.o.   MRN: [id number]     Subjective:  No CP/SOB, POD# 2  Ant STEMI  Objective:  Temp:  [97.3 F (36.3 C)-98.4 F (36.9 C)] 97.5 F (36.4 C) (03/10 0400) Pulse Rate:  [61-71] 71 (03/10 0400) Resp:  [16-18] 18 (03/09 1358) BP: (99-125)/(55-75) 118/75 mmHg (03/10 0400) SpO2:  [95 %-99 %] 95 % (03/10 0400) Weight change:   Intake/Output from previous day: 03/09 0701 - 03/10 0700 In: 746.3 [P.O.:240; I.V.:506.3] Out: 950 [Urine:950]  Intake/Output from this shift:    Physical Exam: General appearance: alert and no distress Neck: no adenopathy, no carotid bruit, no JVD, supple, symmetrical, trachea midline and thyroid not enlarged, symmetric, no tenderness/mass/nodules Lungs: clear to auscultation bilaterally Heart: regular rate and rhythm, S1, S2 normal, no murmur, click, rub or gallop Extremities: extremities normal, atraumatic, no cyanosis or edema and Right groin fem puncture site OK  Lab Results: Results for orders placed or performed during the hospital encounter of 09/04/14 (from the past 48 hour(s))  Troponin I (serum)     Status: Abnormal   Collection Time: 09/04/14  2:02 PM  Result Value Ref Range   Troponin I 0.04 (H) <0.031 ng/mL    Comment:        PERSISTENTLY INCREASED TROPONIN VALUES IN THE RANGE OF 0.04-0.49 ng/mL CAN BE SEEN IN:       -UNSTABLE ANGINA       -CONGESTIVE HEART FAILURE       -MYOCARDITIS       -CHEST TRAUMA       -ARRYHTHMIAS       -LATE PRESENTING MYOCARDIAL INFARCTION       -COPD   CLINICAL FOLLOW-UP RECOMMENDED.   CK total and CKMB     Status: None   Collection Time: 09/04/14  2:02 PM  Result Value Ref Range   Total CK 57 7 - 232 U/L   CK, MB 2.1 0.3 - 4.0 ng/mL   Relative Index RELATIVE INDEX IS INVALID 0.0 - 2.5    Comment: WHEN CK < 100 U/L          POCT i-Stat troponin I     Status: None   Collection Time: 09/04/14  2:07 PM  Result Value Ref Range   Troponin i, poc  0.03 0.00 - 0.08 ng/mL   Comment 3            Comment: Due to the release kinetics of cTnI, a negative result within the first hours of the onset of symptoms does not rule out myocardial infarction with certainty. If myocardial infarction is still suspected, repeat the test at appropriate intervals.   I-STAT, chem 8     Status: Abnormal   Collection Time: 09/04/14  2:08 PM  Result Value Ref Range   Sodium 140 135 - 145 mmol/L   Potassium 3.1 (L) 3.5 - 5.1 mmol/L   Chloride 104 96 - 112 mmol/L   BUN 13 6 - 23 mg/dL   Creatinine, Ser 0.90 0.50 - 1.35 mg/dL   Glucose, Bld 112 (H) 70 - 99 mg/dL   Calcium, Ion 1.18 1.13 - 1.30 mmol/L   TCO2 15 0 - 100 mmol/L   Hemoglobin 15.6 13.0 - 17.0 g/dL   HCT 46.0 39.0 - 52.0 %  POCT Activated clotting time     Status: None   Collection Time: 09/04/14  2:32 PM  Result Value Ref Range   Activated Clotting Time 411  seconds  MRSA PCR Screening     Status: None   Collection Time: 09/04/14  4:34 PM  Result Value Ref Range   MRSA by PCR NEGATIVE NEGATIVE    Comment:        The GeneXpert MRSA Assay (FDA approved for NASAL specimens only), is one component of a comprehensive MRSA colonization surveillance program. It is not intended to diagnose MRSA infection nor to guide or monitor treatment for MRSA infections.   Basic metabolic panel     Status: Abnormal   Collection Time: 09/05/14  3:40 AM  Result Value Ref Range   Sodium 140 135 - 145 mmol/L   Potassium 3.3 (L) 3.5 - 5.1 mmol/L   Chloride 109 96 - 112 mmol/L   CO2 24 19 - 32 mmol/L   Glucose, Bld 114 (H) 70 - 99 mg/dL   BUN 10 6 - 23 mg/dL   Creatinine, Ser 0.93 0.50 - 1.35 mg/dL   Calcium 8.4 8.4 - 10.5 mg/dL   GFR calc non Af Amer 88 (L) >90 mL/min   GFR calc Af Amer >90 >90 mL/min    Comment: (NOTE) The eGFR has been calculated using the CKD EPI equation. This calculation has not been validated in all clinical situations. eGFR's persistently <90 mL/min signify possible  Chronic Kidney Disease.    Anion gap 7 5 - 15  CBC     Status: None   Collection Time: 09/05/14  3:40 AM  Result Value Ref Range   WBC 8.1 4.0 - 10.5 K/uL   RBC 4.49 4.22 - 5.81 MIL/uL   Hemoglobin 13.3 13.0 - 17.0 g/dL   HCT 39.0 39.0 - 52.0 %   MCV 86.9 78.0 - 100.0 fL   MCH 29.6 26.0 - 34.0 pg   MCHC 34.1 30.0 - 36.0 g/dL   RDW 15.0 11.5 - 15.5 %   Platelets 188 150 - 400 K/uL  Troponin I (q 6hr x 3)     Status: Abnormal   Collection Time: 09/05/14  9:45 AM  Result Value Ref Range   Troponin I 62.37 (HH) <0.031 ng/mL    Comment:        POSSIBLE MYOCARDIAL ISCHEMIA. SERIAL TESTING RECOMMENDED. CRITICAL RESULT CALLED TO, READ BACK BY AND VERIFIED WITH: PADGETT H RN 09/05/14 1132 COSTELLO B REPEATED TO VERIFY   Troponin I (q 6hr x 3)     Status: Abnormal   Collection Time: 09/05/14  2:05 PM  Result Value Ref Range   Troponin I 51.86 (HH) <0.031 ng/mL    Comment:        POSSIBLE MYOCARDIAL ISCHEMIA. SERIAL TESTING RECOMMENDED. REPEATED TO VERIFY CRITICAL VALUE NOTED.  VALUE IS CONSISTENT WITH PREVIOUSLY REPORTED AND CALLED VALUE.   Troponin I (q 6hr x 3)     Status: Abnormal   Collection Time: 09/05/14  9:09 PM  Result Value Ref Range   Troponin I 42.62 (HH) <0.031 ng/mL    Comment:        POSSIBLE MYOCARDIAL ISCHEMIA. SERIAL TESTING RECOMMENDED. REPEATED TO VERIFY CRITICAL VALUE NOTED.  VALUE IS CONSISTENT WITH PREVIOUSLY REPORTED AND CALLED VALUE.     Imaging: Imaging results have been reviewed  Tele- NSR  Assessment/Plan:   Active Problems:   ST elevation myocardial infarction involving left anterior descending (LAD) coronary artery   STEMI (ST elevation myocardial infarction) 1. HTN 2. HLD 3. CVA 4. PVD  Time Spent Directly with Patient:  20 minutes  Length of Stay:  LOS: 2 days  Anterior MI:  Continues to smoke Had missed a dose of plavix  Now on DAT with Brillinta.  Discussed smoking cessation.  Echo shows EF 40-45%  D/C home  today Outpatient f/u Dr Percival Spanish  Would fill brillinta and nitro at [facility].  He will eventually get these at [facility]  Has signed up for smoking cessation class There.  Ambulate this am if no pains d/c home   Jenkins Rouge 09/06/2014, 8:01 AM

## 2014-09-06 NOTE — Progress Notes (Signed)
Paged by nurse stating that patient primary pharmacy at [facility] does not have Brilinta, he went to [facility] to obtain Brilinta and want all of his other Rx sent to [facility] instead. I have sent the medication to [facility] and called [facility] to cancel previous Rx  Danny Corrigan PA Pager: [phone]

## 2014-09-06 NOTE — Discharge Instructions (Signed)

## 2014-09-06 NOTE — Discharge Summary (Signed)
Discharge Summary   Patient ID: [name],  MRN: [id number], DOB/AGE: [birth year] 63 y.o.  Admit date: 09/04/2014 Discharge date: 09/06/2014  Primary Care Provider: Florina Ou Primary Cardiologist: Dr. Percival Spanish at [facility]  Discharge Diagnoses Principal Problem:   ST elevation myocardial infarction involving left anterior descending (LAD) coronary artery Active Problems:   CAD (coronary artery disease)   Hyperlipidemia   Tobacco abuse   Prostate cancer   Cardiomyopathy, ischemic   Allergies Allergies  Allergen Reactions  . Statins     Procedures  Echocardiogram 09/05/2014 - Left ventricle: The cavity size was normal. There was mild focal basal hypertrophy of the septum. Systolic function was mildly to moderately reduced. The estimated ejection fraction was in the range of 40% to 45%. There is akinesis of the entire apical myocardium. There is akinesis of the apical septal, apical anterior, apical lateral, apical inferior and apical anterior myocardium. The basal segements are hyperdynamic. This could be seen in stress induced cardiomyopathy. There was an increased relative contribution of atrial contraction to ventricular filling. Doppler parameters are consistent with abnormal left ventricular relaxation (grade 1 diastolic dysfunction). - Aortic valve: Mild thickening and calcification, consistent with sclerosis.    Cardiac catheterization 09/04/2014 IMPRESSION:  Anterior ST segment elevation myocardial infarction secondary to total proximal LAD occlusion prior to the previously placed LAD stent.  Mild concomitant CAD with 40% AV groove circumflex stenosis and 30% mid RCA stenosis and a normal moderate size ramus intermediate vessel.  Successful emergent PCI of the totally occluded LAD, treated with PTCA, for runs of thrombectomy with significant clot removal, and stenting with a Synergy DES 3.020 mm stent, postdilated to 3.25 mm with the  100% occlusion being reduced to 0% and with resumption of TIMI-3 flow.  RECOMMENDATION:  The patient presented with the ST segment elevation myocardial infarction having been on Plavix although admits to only missing this morning's dose. Recommend long-term DAPT therapy with aspirin/Brilinta per Plato trial with ultimate reduced dose after one year for Pegasys trial. Smoking cessation is imperative. The patient states that he cannot take a statin. Alternative therapy will be necessary including Zetia and possible PCSK9 inhibition.     Hospital Course  This is a 64 year old male with past medical history of HTN, HLD intolerant to statins, CAD status post DES to LAD in 2013, COPD with ongoing tobacco abuse, history of CVA and PVD s/p aortobifem bypass who presented on 09/04/2014 with anterior STEMI. He was in his usual state of health until around 12:45 PM on the day of admission when he started having acute onset of chest pain. He called EMS and received 3 sublingual nitroglycerin with minimal relief. EKG showed anterior ST elevation. Code STEMI was activated. Of note, patient has been taking Plavix regularly however did not take it in the morning of admission. He was taken to the Cath Lab emergently for cardiac catheterization.   Cardiac cath on 09/04/2014 showed 40% AV groove circumflex stenosis, 30% mid RCA stenosis, total proximal LAD occlusion treated with PTCA/thrombectomy/Synergy DES 3.020 mm stent, postdilated to 3.25 mm. Post cath, he was transferred to CCU for observation. Given the fact patient has been on Plavix prior to admission, he was placed on aspirin and Brilinta. He has been educated regarding smoke cessation. Since he is intolerant to statins, alternative therapy including PCSK9 inhibitor should be considered. He was started on low-dose beta blocker on the following day. Echocardiogram obtained on 3/9 showed EF 40-45%, akinesis of the entire apical myocardium, akinesis of the apical  septal,  apical anterior, apical lateral, apical inferior myocardium, grade 1 diastolic dysfunction. Patient's troponin peaked at 62.37 before turning down.   He was seen in the morning of 09/06/2014, at which time he denies significant chest discomfort or shortness of breath. He ambulated this morning with no significant chest pain, he is deemed stable for discharge from cardiology perspective. The earlist followup I could find was in 1 month with Dr. Percival Spanish at [facility] office, i talked with patient regarding driving down to [facility] in 1week for outpatient vs keep 1 month followup with Dr. Percival Spanish and contact cardiology if anything come up. Patient wish to keep current scheduled followup with Dr. Percival Spanish. I have given the patient 1 month Brilinta to go with free coupon. I have sent 12 month of Brilinta to [facility] mail delivery service. Patient has been urged to be compliant with DAPT and contact cardiology if run out of medication for any reason.   Discharge Vitals Blood pressure 105/75, pulse 71, temperature 98.2 F (36.8 C), temperature source Oral, resp. rate 18, height 5\' 9"  (1.753 m), weight 194 lb 0.1 oz (88 kg), SpO2 94 %.  Filed Weights   09/04/14 1500 09/04/14 1645  Weight: 192 lb (87.091 kg) 194 lb 0.1 oz (88 kg)    Labs  CBC  Recent Labs  09/04/14 1408 09/05/14 0340  WBC  --  8.1  HGB 15.6 13.3  HCT 46.0 39.0  MCV  --  86.9  PLT  --  203   Basic Metabolic Panel  Recent Labs  09/04/14 1408 09/05/14 0340  NA 140 140  K 3.1* 3.3*  CL 104 109  CO2  --  24  GLUCOSE 112* 114*  BUN 13 10  CREATININE 0.90 0.93  CALCIUM  --  8.4   Cardiac Enzymes  Recent Labs  09/04/14 1402 09/05/14 0945 09/05/14 1405 09/05/14 2109  CKTOTAL 57  --   --   --   CKMB 2.1  --   --   --   TROPONINI 0.04* 62.37* 51.86* 42.62*    Disposition  Pt is being discharged home today in good condition.  Follow-up Plans & Appointments      Follow-up Information    Follow up with  Minus Breeding, MD On 10/10/2014.   Specialty:  Cardiology   Why:  4:15pm   Contact information:   [place] [address] [phone]       Discharge Medications    Medication List    STOP taking these medications        clopidogrel 75 MG tablet  Commonly known as:  PLAVIX      TAKE these medications        aspirin EC 81 MG tablet  Take 1 tablet (81 mg total) by mouth daily.     ezetimibe 10 MG tablet  Commonly known as:  ZETIA  Take 1 tablet (10 mg total) by mouth daily.     FLOMAX 0.4 MG Caps capsule  Generic drug:  tamsulosin  Take 0.4 mg by mouth daily.     folic acid 803 MCG tablet  Commonly known as:  FOLVITE  Take 400 mcg by mouth daily.     metoprolol tartrate 25 MG tablet  Commonly known as:  LOPRESSOR  Take 0.5 tablets (12.5 mg total) by mouth 2 (two) times daily.     Multiple Vitamins tablet  Take 1 tablet by mouth daily.     niacin 250 MG tablet  Take 250  mg by mouth at bedtime.     nitroGLYCERIN 0.4 MG SL tablet  Commonly known as:  NITROSTAT  Place 1 tablet (0.4 mg total) under the tongue every 5 (five) minutes as needed for chest pain (up to 3 doses).     RA SAW PALMETTO 80 MG capsule  Generic drug:  saw palmetto  Take 160 mg by mouth daily.     ticagrelor 90 MG Tabs tablet  Commonly known as:  BRILINTA  Take 1 tablet (90 mg total) by mouth 2 (two) times daily.         Duration of Discharge Encounter   Greater than 30 minutes including physician time.  Hilbert Corrigan PA-C Pager: [phone] 09/06/2014, 9:54 AM

## 2014-09-10 ENCOUNTER — Telehealth: Payer: Self-pay | Admitting: Cardiology

## 2014-09-10 NOTE — Telephone Encounter (Signed)
New message      Pt has a question about all of his meds.  Recently had a heart attack

## 2014-09-10 NOTE — Telephone Encounter (Signed)
I talked to this pt. About his medications , he wanted to know what Zetia was and he wanted to know if he could take anything else besides tylenol for pain, I told him he could use motrin at low doses

## 2014-10-10 ENCOUNTER — Ambulatory Visit (INDEPENDENT_AMBULATORY_CARE_PROVIDER_SITE_OTHER): Payer: Medicare HMO | Admitting: Cardiology

## 2014-10-10 ENCOUNTER — Encounter: Payer: Self-pay | Admitting: Cardiology

## 2014-10-10 VITALS — BP 98/62 | HR 61 | Ht 69.0 in | Wt 190.0 lb

## 2014-10-10 DIAGNOSIS — I2510 Atherosclerotic heart disease of native coronary artery without angina pectoris: Secondary | ICD-10-CM | POA: Diagnosis not present

## 2014-10-10 DIAGNOSIS — I255 Ischemic cardiomyopathy: Secondary | ICD-10-CM | POA: Diagnosis not present

## 2014-10-10 NOTE — Progress Notes (Signed)
HPI The patient presents for follow up of CAD.  He was hospitalized in March with an acute anterior myocardial infarction. I reviewed these results. Catheterization demonstrating total occlusion of the LAD proximal to his previously placed stent. He had 30% RCA stenosis and 40% stenosis elsewhere. He was on Plavix at that time and seems to have failed that medication. He did have an ejection fraction of about 45% with anteroapical akinesis. Since then he's been fatigue but he's had no further chest discomfort. He unfortunately continues to smoke cigarettes and is very addicted. He denies any chest pressure, neck or arm discomfort. He's had no palpitations, presyncope or syncope. He's had no PND or orthopnea.  Of note he is being followed for prostate cancer which is being watched expectantly. He has had radiation for vocal cord cancer.  Allergies  Allergen Reactions  . Statins     Current Outpatient Prescriptions  Medication Sig Dispense Refill  . aspirin EC 81 MG tablet Take 1 tablet (81 mg total) by mouth daily.    Marland Kitchen ezetimibe (ZETIA) 10 MG tablet Take 1 tablet (10 mg total) by mouth daily. 30 tablet 5  . folic acid (FOLVITE) 366 MCG tablet Take 400 mcg by mouth daily.    . metoprolol tartrate (LOPRESSOR) 25 MG tablet Take 0.5 tablets (12.5 mg total) by mouth 2 (two) times daily. 30 tablet 5  . Multiple Vitamins tablet Take 1 tablet by mouth daily.     . niacin 250 MG tablet Take 250 mg by mouth at bedtime.    . nitroGLYCERIN (NITROSTAT) 0.4 MG SL tablet Place 1 tablet (0.4 mg total) under the tongue every 5 (five) minutes as needed for chest pain (up to 3 doses). 25 tablet 5  . saw palmetto (RA SAW PALMETTO) 80 MG capsule Take 160 mg by mouth daily.     . tamsulosin (FLOMAX) 0.4 MG CAPS capsule Take 0.4 mg by mouth daily.     . ticagrelor (BRILINTA) 90 MG TABS tablet Take 1 tablet (90 mg total) by mouth 2 (two) times daily. 180 tablet 3   No current facility-administered medications for  this visit.    Past Medical History  Diagnosis Date  . Hyperlipidemia     intolerant to statins  . PVD (peripheral vascular disease)     Aortobifemoral Bypass 2010, Thrombectomy and repair right brachial artery   . CAD (coronary artery disease) 12/2008    a. cath 11/2011 s/p DES to LAD b. anterior STEMI 09/04/2014 cath DES to occluded prox LAD, 40% AV groove LCx and 30% mid RCA  . CVA (cerebral infarction) 06/2009  . Elevated PSA 02/2012    6.54  . Tobacco abuse   . Unstable angina   . Cancer 06/2012    prostate, larynx  . Radiation 08/10/2012-09/22/2012    6525 cGy to larynx    Past Surgical History  Procedure Laterality Date  . Aortobifemoral bypass  2010  . 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 Larynx biopsy  07/12/12  . Prostate biopsy  07/12/12    Gleason's grade 3+3+6  . Coronary angioplasty with stent placement  11/2011  . Left heart catheterization with coronary angiogram N/A 12/17/2011    Procedure: LEFT HEART CATHETERIZATION WITH CORONARY ANGIOGRAM;  Surgeon: Hillary Bow, MD;  Location: [facility] CATH LAB;  Service: Cardiovascular;  Laterality: N/A;  . Percutaneous coronary stent intervention (pci-s) N/A 12/17/2011  Procedure: PERCUTANEOUS CORONARY STENT INTERVENTION (PCI-S);  Surgeon: Hillary Bow, MD;  Location: [facility] CATH LAB;  Service: Cardiovascular;  Laterality: N/A;  . Left heart catheterization with coronary angiogram N/A 09/04/2014    Procedure: LEFT HEART CATHETERIZATION WITH CORONARY ANGIOGRAM;  Surgeon: Troy Sine, MD;  Location[facility] CATH LAB;  Service: Cardiovascular;  Laterality: N/A;    ROS:  Fatigue.  Otherwise as stated in the HPI and negative for all other systems.  PHYSICAL EXAM BP 98/62 mmHg  Pulse 61  Ht 5\' 9"  (1.753 m)  Wt 190 lb (86.183 kg)  BMI 28.05 kg/m2GENERAL:  Well appearing HEENT:  Pupils equal round and reactive, fundi not visualized NECK:  No jugular venous  distention, waveform within normal limits, carotid upstroke brisk and symmetric, no bruits, no thyromegaly LYMPHATICS:  No cervical, inguinal adenopathy LUNGS:  Clear to auscultation bilaterally CHEST:  Unremarkable HEART:  PMI not displaced or sustained,S1 and S2 within normal limits, no S3, no S4, no clicks, no rubs, no murmurs ABD:  Flat, positive bowel sounds normal in frequency in pitch, no bruits, no rebound, no guarding, no midline pulsatile mass, no hepatomegaly, no splenomegaly EXT:  2 plus pulses throughout, no edema, no cyanosis no clubbing   EKG: Sinus rhythm, rate 61, axis within normal limits, intervals within normal limits, anterolateral T-wave inversion demonstrating evolution of his recent anterior myocardial infarction. Old anteroseptal infarct. 10/10/2014  ASSESSMENT AND PLAN  CAD -  The patient has is now status post anterior MI. He's going to remain indefinitely on DAPT. He unfortunately is intolerant of statins. He can't afford the PCSK9 and can only take this if the [place] will cover it. He's going to see them soon and we are going to talk about it. For now he will remain on Zetia. He unfortunately can't stop smoking but is going to be seen at the [place] in a new smoking clinic.  Hyperlipidemia -  As above. He is truly statin intolerant.  Tobacco abuse -  Again we talked about the possibility of stopping smoking and he will get help at the [place].  Ischemic cardiomyopathy - His blood pressure will not allow med titration at this point.   Hospital records reviewed.

## 2014-10-10 NOTE — Patient Instructions (Signed)
The current medical regimen is effective;  continue present plan and medications.  Follow up in 4 months with Dr. Percival Spanish in [place].  You will receive a letter in the mail 2 months before you are due.  Please call us when you receive this letter to schedule your follow up appointment.  Thank you for choosing [facility]!!

## 2014-10-25 ENCOUNTER — Telehealth: Payer: Self-pay | Admitting: Cardiology

## 2014-10-25 NOTE — Telephone Encounter (Signed)
I talked with this pt. And told him it was not uncommon to feel weak and tired after a heart attack and that Dr. Percival Spanish is aware of this . But, if he doesn't improve or gets worse and develops any other symptoms to let us know, pt. Agreed with plan

## 2014-10-25 NOTE — Telephone Encounter (Signed)
Pt says since his heart attack,he have not been feeling good. Pt have been dizzy,headaches achy, and tired,just does not feel good.

## 2014-12-07 ENCOUNTER — Telehealth: Payer: Self-pay | Admitting: Cardiology

## 2014-12-07 MED ORDER — TICAGRELOR 90 MG PO TABS: 90.0000 mg | ORAL_TABLET | Freq: Two times a day (BID) | ORAL | Status: DC

## 2014-12-07 NOTE — Telephone Encounter (Signed)
Pt would like some samples of Brilinta please. He needs enough until his mail order comes in.He has enough for 6 days.

## 2014-12-07 NOTE — Telephone Encounter (Signed)
Samples at front desk, pt informed. Pt sees Dr. Percival Spanish at [place] office. Gave directions to [employer] office w/ instruction on level access parking and walking directions; office hours. Pt verbalized understanding.

## 2015-02-14 ENCOUNTER — Ambulatory Visit (INDEPENDENT_AMBULATORY_CARE_PROVIDER_SITE_OTHER): Payer: Medicare HMO | Admitting: Adult Health

## 2015-02-14 ENCOUNTER — Encounter: Payer: Self-pay | Admitting: Adult Health

## 2015-02-14 VITALS — BP 107/70 | HR 62 | Ht 69.0 in | Wt 191.0 lb

## 2015-02-14 DIAGNOSIS — G8190 Hemiplegia, unspecified affecting unspecified side: Secondary | ICD-10-CM | POA: Diagnosis not present

## 2015-02-14 DIAGNOSIS — Z8673 Personal history of transient ischemic attack (TIA), and cerebral infarction without residual deficits: Secondary | ICD-10-CM

## 2015-02-14 NOTE — Progress Notes (Signed)
I agree with the above plan 

## 2015-02-14 NOTE — Progress Notes (Signed)
[name]: Danny [name] DOB: [birth year]  REASON FOR VISIT: follow up- stroke HISTORY FROM: [name]  HISTORY OF PRESENT ILLNESS: Danny [name] is a 64 year old [name] with a history of stroke. He returns today for follow-up. Danny [name] is currently taking Brilinta. He is tolerating this medication well. Danny [name] reports that his blood pressure has been controlled. His cholesterol levels were slightly elevated and he was started on Zetia. He has not had his lipid level rechecked. Danny [name] is still smoking. He has participated in smoking cessation classes at Danny [name] however he has not found this beneficial. Danny [name] denies any changes with his gait or balance. He was given a cane after his stroke but rarley uses it. He denies any falls. Danny [name] reports that he did have a MRI in March 2016. He reports that a stent had to be placed. He states that since then he has been more fatigued so trying to exercise has been difficult. He denies any new neurological symptoms. He returns today for an evaluation.  HISTORY 08/16/14: Danny [name] is a 64 year old [name] with a history of stroke. He returns today for follow-up. Danny [name] is currently taking aspirin for stroke prevention. He was recently switched from Plavix to [place]. He was unable to afford Danny Brilinta so he went back to Plavix. Danny [name]'s blood pressure has been controlled. Today it is slightly elevated. [name] states that it was due to him coughing. He just had his BP checked at his PCP and it was 117/77. Danny [name] is currently not taking anything except Niacin for his cholesterol, unable to tolerate statins. [name] had blood drawn last week at Danny [name] to check lipid levels. After Danny stroke Danny [name] was left with residual weakness on Danny left side. [name]'s gait has remained Danny same. He has a cane but does not use it often. [name] does smoke but is be enrolled in a smoking cessation program by Danny [name]. [name]  denies any additional stroke like symptoms.  HISTORY 01/16/14 ([name]): Danny year Caucasian [name] who developed transient episode of bilateral blurred vision and inability to focus while getting out of his truck and shopping at [employer] on 11/23/13. He noticed that his gait and balance also were off but he could walk with some difficulty. After he entered Danny store he noticed that he could barely see in digestible transport and did not call for help. His vision started improving in about 5 minutes but he noticed that his left-sided coordination was more affected. He was about to walk to Danny car. He also had some increased subjective numbness on Danny left side. He was able to drive home and by Danny time his wife got home his symptoms resolved completely within 2 hours. He denied any complaint headache, double vision, vertigo. He does have prior history of right brain subcortical infarct in 2011 when he saw me for left-sided weakness and paresthesias. MRI scan had shown right internal capsule and thalamic infarct. He did have residual mild left leg weakness clumsiness as well as left hemianesthesia which has persisted. His vascular risk factors were found to be hyperlipidemia but he history of intolerance to multiple statins and has not been on therapy with statins. He has been on tri-Danny Crixivan Niaspan. He has been on aspirin 81 mg and Plavix preprocedure unstable angina and had cardiac stent a few years ago. He continues to smoke and smokes one pack per day and states his tried to quit multiple times  but has been unable to do so. He has not had any recent stroke or TIA workup. He was diagnosed with throat cancer a year ago and underwent radiation to his throat. He was also diagnosed with prostate cancer and is on treatment. He has not had any recent stroke or TIA workup. Update 01/16/2014 : He returns for followup of her initial consultation on 11/28/13. He'll undergone several tests and is keen to discuss results.  Transversely echo done on 12/13/13 showed normal ejection fraction without cardiac source of embolism. Danny [name] chose not to have lipid profile and hemoglobin A1c checked but states his ring to do that today. MRA of Danny brain 12/06/13 was normal. MRA of Danny neck showed no significant extracranial stenosis. MRI scan of Danny brain shows remote age right thalamic infarct and moderate changes of chronic microvascular ischemia and severe changes of paranasal sinusitis. Compared with previous MRI from 1/30//2011 this represented more advanced changes. Have personally reviewed Danny films and discuss results with Danny [name] and wife. And answered questions. He has not had any recurrent neurological symptoms and remained stable. He remains on aspirin and Plavix but does have an upcoming visit with his cardiologist and will discuss switching Plavix to prevent a. Have also advised him to consider Danny new non-statin lipid-lowering injectable medicine Praluent if his lipid profile comes elevated  REVIEW OF SYSTEMS: Out of a complete 14 system review of symptoms, Danny [name] complains only of Danny following symptoms, and all other reviewed systems are negative.  See history of present illness  ALLERGIES: Allergies  Allergen Reactions  . Statins     HOME MEDICATIONS: Outpatient Prescriptions Prior to Visit  Medication Sig Dispense Refill  . aspirin EC 81 MG tablet Take 1 tablet (81 mg total) by mouth daily.    Marland Kitchen ezetimibe (ZETIA) 10 MG tablet Take 1 tablet (10 mg total) by mouth daily. 30 tablet 5  . metoprolol tartrate (LOPRESSOR) 25 MG tablet Take 0.5 tablets (12.5 mg total) by mouth 2 (two) times daily. 30 tablet 5  . nitroGLYCERIN (NITROSTAT) 0.4 MG SL tablet Place 1 tablet (0.4 mg total) under Danny tongue every 5 (five) minutes as needed for chest pain (up to 3 doses). 25 tablet 5  . tamsulosin (FLOMAX) 0.4 MG CAPS capsule Take 0.4 mg by mouth daily.     . ticagrelor (BRILINTA) 90 MG TABS tablet Take 1  tablet (90 mg total) by mouth 2 (two) times daily. 40 tablet 0  . folic acid (FOLVITE) 829 MCG tablet Take 400 mcg by mouth daily.    . Multiple Vitamins tablet Take 1 tablet by mouth daily.     . niacin 250 MG tablet Take 250 mg by mouth at bedtime.    . saw palmetto (RA SAW PALMETTO) 80 MG capsule Take 160 mg by mouth daily.      No facility-administered medications prior to visit.    PAST MEDICAL HISTORY: Past Medical History  Diagnosis Date  . Hyperlipidemia     intolerant to statins  . PVD (peripheral vascular disease)     Aortobifemoral Bypass 2010, Thrombectomy and repair right brachial artery   . CAD (coronary artery disease) 12/2008    a. cath 11/2011 s/p DES to LAD b. anterior STEMI 09/04/2014 cath DES to occluded prox LAD, 40% AV groove LCx and 30% mid RCA  . CVA (cerebral infarction) 06/2009  . Elevated PSA 02/2012    6.54  . Tobacco abuse   . Unstable angina   .  Cancer 06/2012    prostate, larynx  . Radiation 08/10/2012-09/22/2012    6525 cGy to larynx    PAST SURGICAL HISTORY: Past Surgical History  Procedure Laterality Date  . Aortobifemoral bypass  2010  . 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 Larynx biopsy  07/12/12  . Prostate biopsy  07/12/12    Gleason's grade 3+3+6  . Coronary angioplasty with stent placement  11/2011  . Left heart catheterization with coronary angiogram N/A 12/17/2011    Procedure: LEFT HEART CATHETERIZATION WITH CORONARY ANGIOGRAM;  Surgeon: Hillary Bow, MD;  Location: [facility] CATH LAB;  Service: Cardiovascular;  Laterality: N/A;  . Percutaneous coronary stent intervention (pci-s) N/A 12/17/2011    Procedure: PERCUTANEOUS CORONARY STENT INTERVENTION (PCI-S);  Surgeon: Hillary Bow, MD;  Location: [facility] CATH LAB;  Service: Cardiovascular;  Laterality: N/A;  . Left heart catheterization with coronary angiogram N/A 09/04/2014    Procedure: LEFT HEART CATHETERIZATION WITH  CORONARY ANGIOGRAM;  Surgeon: Troy Sine, MD;  Location: [facility] CATH LAB;  Service: Cardiovascular;  Laterality: N/A;    FAMILY HISTORY: Family History  Problem Relation Age of Onset  . Coronary artery disease Father 61  . Heart disease Father   . Coronary artery disease Brother 43    died with AMI  . Heart disease Brother   . Breast cancer Mother     SOCIAL HISTORY: Social History   Social History  . Marital Status: Married    Spouse Name: cheryl  . Number of Children: 3  . Years of Education: 12   Occupational History  .      disabled   Social History Main Topics  . Smoking status: Current Some Day Smoker -- 1.00 packs/day for 45 years    Types: Cigarettes    Start date: 10/07/1967  . Smokeless tobacco: Never Used  . Alcohol Use: No     Comment: rarely  . Drug Use: No  . Sexual Activity: Yes   Other Topics Concern  . Not on file   Social History Narrative   [name] lives with his wife [name]).   Disabled.   Education high school.   [name] right handed      PHYSICAL EXAM  Filed Vitals:   02/14/15 0907  BP: 107/70  Pulse: 62  Height: 5\' 9"  (1.753 m)  Weight: 191 lb (86.637 kg)   Body mass index is 28.19 kg/(m^2).  Generalized: Well developed, in no acute distress   Neurological examination  Mentation: Alert oriented to time, place, history taking. Follows all commands speech and language fluent Cranial nerve II-XII: Pupils were equal round reactive to light. Extraocular movements were full, visual field were full on confrontational test. Facial sensation and strength were normal. Uvula tongue midline. Head turning and shoulder shrug  were normal and symmetric. Motor: Danny motor testing reveals 5 over 5 strength on Danny right upper and lower extremity. 4 over 5 strength in Danny left upper and lower extremity. Good symmetric motor tone is noted throughout.  Sensory: Sensory testing is intact to soft touch on all 4 extremities. No evidence of extinction  is noted.  Coordination: Cerebellar testing reveals good finger-nose-finger and heel-to-shin bilaterally.  Gait and station: [name] has a slight limp on Danny left when ambulate. Tandem gait is slightly unsteady. Romberg is negative.  Reflexes: Deep tendon reflexes are symmetric and normal bilaterally.   DIAGNOSTIC DATA (LABS, IMAGING, TESTING) - I reviewed  [name] records, labs, notes, testing and imaging myself where available.  Lab Results  Component Value Date   WBC 8.1 09/05/2014   HGB 13.3 09/05/2014   HCT 39.0 09/05/2014   MCV 86.9 09/05/2014   PLT 188 09/05/2014    ASSESSMENT AND PLAN  64 y.o. year old [name]  has a past medical history of Hyperlipidemia; PVD (peripheral vascular disease); CAD (coronary artery disease) (12/2008); CVA (cerebral infarction) (06/2009); Elevated PSA (02/2012); Tobacco abuse; Unstable angina; Cancer (06/2012); and Radiation (08/10/2012-09/22/2012). here with:  1. History of stroke with residual hemiparesis  Overall Danny [name] is doing well. He will continue aspirin and plavix for stroke prevention. Strict control of lipids with LDL cholesterol goal below 100 mg percent. Strict control of blood pressure with Goal <130/90. Encouraged [name] to participate in light exercise daily. He should continue monitoring his diet. Also encouraged [name] to stop smoking. He will follow-up in 6 months with Dr. Leonie Man. I explained to Danny [name] that if he remained stable then we could release him back to his PCP for continued follow-up. [name] verbalized understanding.   Ward Givens, MSN, NP-C 02/14/2015, 9:38 AM [facility] [address], [place], [place] [address] [phone]

## 2015-02-14 NOTE — Patient Instructions (Addendum)
Continue brillenta for stroke prevention.  Strict control of lipids with LDL cholesterol goal below 100 mg percent.  Strict control of blood pressure. Goal <130/90 Stop smoking If you have any stroke like symptoms call 911 immediately

## 2015-02-20 ENCOUNTER — Encounter: Payer: Self-pay | Admitting: Cardiology

## 2015-02-20 ENCOUNTER — Ambulatory Visit (INDEPENDENT_AMBULATORY_CARE_PROVIDER_SITE_OTHER): Payer: Medicare HMO | Admitting: Cardiology

## 2015-02-20 VITALS — BP 118/80 | HR 64 | Ht 69.0 in | Wt 191.0 lb

## 2015-02-20 DIAGNOSIS — I2510 Atherosclerotic heart disease of native coronary artery without angina pectoris: Secondary | ICD-10-CM | POA: Diagnosis not present

## 2015-02-20 DIAGNOSIS — I255 Ischemic cardiomyopathy: Secondary | ICD-10-CM

## 2015-02-20 DIAGNOSIS — I2102 ST elevation (STEMI) myocardial infarction involving left anterior descending coronary artery: Secondary | ICD-10-CM | POA: Diagnosis not present

## 2015-02-20 MED ORDER — ISOSORBIDE MONONITRATE ER 30 MG PO TB24: 30.0000 mg | ORAL_TABLET | Freq: Every day | ORAL | Status: DC

## 2015-02-20 NOTE — Progress Notes (Signed)
HPI The patient presents for follow up of CAD.  He was hospitalized in March with an acute anterior myocardial infarction.  Catheterization demonstrated total occlusion of the LAD proximal to his previously placed stent. He had 30% RCA stenosis and 40% stenosis elsewhere. He was on Plavix at that time and seems to have failed that medication. He did have an ejection fraction of about 45% with anteroapical akinesis. Of note he is being followed for prostate cancer which is being watched expectantly. He has had radiation for vocal cord cancer.  Since I last saw him he has had some chest discomfort. This is sporadic. He's had probably about 7 or 8 episodes. It happens in the morning. It comes and goes spontaneously. He can't bring it on with activity such as walking in a pool. Is not nearly as severe as previous. He can do activities without chest pressure, neck or arm discomfort. He never has to take a nitroglycerin. He does not think this is an increased pattern. He's had no new shortness of breath, PND or orthopnea.  Allergies  Allergen Reactions  . Statins Other (See Comments)    Muscle pain    Current Outpatient Prescriptions  Medication Sig Dispense Refill  . aspirin EC 81 MG tablet Take 1 tablet (81 mg total) by mouth daily.    Marland Kitchen ezetimibe (ZETIA) 10 MG tablet Take 1 tablet (10 mg total) by mouth daily. 30 tablet 5  . metoprolol tartrate (LOPRESSOR) 25 MG tablet Take 0.5 tablets (12.5 mg total) by mouth 2 (two) times daily. 30 tablet 5  . nitroGLYCERIN (NITROSTAT) 0.4 MG SL tablet Place 1 tablet (0.4 mg total) under the tongue every 5 (five) minutes as needed for chest pain (up to 3 doses). 25 tablet 5  . tamsulosin (FLOMAX) 0.4 MG CAPS capsule Take 0.4 mg by mouth daily.     . ticagrelor (BRILINTA) 90 MG TABS tablet Take 1 tablet (90 mg total) by mouth 2 (two) times daily. 40 tablet 0   No current facility-administered medications for this visit.    Past Medical History  Diagnosis  Date  . Hyperlipidemia     intolerant to statins  . PVD (peripheral vascular disease)     Aortobifemoral Bypass 2010, Thrombectomy and repair right brachial artery   . CAD (coronary artery disease) 12/2008    a. cath 11/2011 s/p DES to LAD b. anterior STEMI 09/04/2014 cath DES to occluded prox LAD, 40% AV groove LCx and 30% mid RCA  . CVA (cerebral infarction) 06/2009  . Elevated PSA 02/2012    6.54  . Tobacco abuse   . Unstable angina   . Cancer 06/2012    prostate, larynx  . Radiation 08/10/2012-09/22/2012    6525 cGy to larynx    Past Surgical History  Procedure Laterality Date  . Aortobifemoral bypass  2010  . 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 Larynx biopsy  07/12/12  . Prostate biopsy  07/12/12    Gleason's grade 3+3+6  . Coronary angioplasty with stent placement  11/2011  . Left heart catheterization with coronary angiogram N/A 12/17/2011    Procedure: LEFT HEART CATHETERIZATION WITH CORONARY ANGIOGRAM;  Surgeon: Hillary Bow, MD;  Location: [facility] CATH LAB;  Service: Cardiovascular;  Laterality: N/A;  . Percutaneous coronary stent intervention (pci-s) N/A 12/17/2011    Procedure: PERCUTANEOUS CORONARY STENT INTERVENTION (PCI-S);  Surgeon: Hillary Bow, MD;  Location: [facility]  CATH LAB;  Service: Cardiovascular;  Laterality: N/A;  . Left heart catheterization with coronary angiogram N/A 09/04/2014    Procedure: LEFT HEART CATHETERIZATION WITH CORONARY ANGIOGRAM;  Surgeon: Troy Sine, MD;  Location: [facility] CATH LAB;  Service: Cardiovascular;  Laterality: N/A;    ROS:  Fatigue.  Otherwise as stated in the HPI and negative for all other systems.  PHYSICAL EXAM BP 118/80 mmHg  Pulse 64  Ht 5\' 9"  (1.753 m)  Wt 191 lb (86.637 kg)  BMI 28.19 kg/m2GENERAL:  Well appearing NECK:  No jugular venous distention, waveform within normal limits, carotid upstroke brisk and symmetric, no bruits, no thyromegaly LYMPHATICS:   No cervical, inguinal adenopathy LUNGS:  Clear to auscultation bilaterally CHEST:  Unremarkable HEART:  PMI not displaced or sustained,S1 and S2 within normal limits, no S3, no S4, no clicks, no rubs, no murmurs ABD:  Flat, positive bowel sounds normal in frequency in pitch, no bruits, no rebound, no guarding, no midline pulsatile mass, no hepatomegaly, no splenomegaly EXT:  2 plus pulses throughout, no edema, no cyanosis no clubbing   EKG: Sinus rhythm, rate 64, axis within normal limits, intervals within normal limits, old anteroseptal infarct. 02/20/2015  ASSESSMENT AND PLAN  CAD -  He has some chest discomfort. This is not as severe as previous angina and not reproducible. I am going to add 30 mg of Imdur to his regimen. He's going to remain indefinitely on DAPT. He unfortunately is intolerant of statins. He can't afford the PCSK9.   For now he will remain on Zetia. He unfortunately can't stop smoking . He was not able to stop despite getting patches and Zyban by the [place].  Hyperlipidemia -  As above. He is truly statin intolerant.  Tobacco abuse -  As above  Ischemic cardiomyopathy - If his BP allows I will try mid titration in the future.

## 2015-02-20 NOTE — Patient Instructions (Signed)
Medication Instructions:  Please start Isosorbide 30 mg a day. Continue all other medications as listed.  Follow-Up: Follow up in 3 months with Dr Percival Spanish.  Thank you for choosing [facility]!!

## 2015-03-14 ENCOUNTER — Other Ambulatory Visit: Payer: Self-pay | Admitting: Physician Assistant

## 2015-03-14 MED ORDER — TICAGRELOR 90 MG PO TABS: 90.0000 mg | ORAL_TABLET | Freq: Two times a day (BID) | ORAL | Status: DC

## 2015-03-14 NOTE — Telephone Encounter (Signed)
Please review for refill, [place] pt. 

## 2015-03-14 NOTE — Telephone Encounter (Signed)
Routing to [employer].

## 2015-03-14 NOTE — Addendum Note (Signed)
Addended by: Golden Hurter D on: 03/14/2015 11:42 AM   Modules accepted: Orders

## 2015-03-15 NOTE — Telephone Encounter (Signed)
Dr Hochrein's patient

## 2015-05-29 ENCOUNTER — Encounter: Payer: Self-pay | Admitting: Cardiology

## 2015-05-29 ENCOUNTER — Ambulatory Visit (INDEPENDENT_AMBULATORY_CARE_PROVIDER_SITE_OTHER): Payer: Medicare HMO | Admitting: Cardiology

## 2015-05-29 VITALS — BP 110/72 | HR 67 | Ht 69.0 in | Wt 199.0 lb

## 2015-05-29 DIAGNOSIS — I2102 ST elevation (STEMI) myocardial infarction involving left anterior descending coronary artery: Secondary | ICD-10-CM

## 2015-05-29 DIAGNOSIS — I255 Ischemic cardiomyopathy: Secondary | ICD-10-CM

## 2015-05-29 NOTE — Progress Notes (Signed)
HPI The patient presents for follow up of CAD.  He was hospitalized in March with an acute anterior myocardial infarction.  Catheterization demonstrated total occlusion of the LAD proximal to his previously placed stent. He had 30% RCA stenosis and 40% stenosis elsewhere. He was on Plavix at that time and seems to have failed that medication. He did have an ejection fraction of about 45% with anteroapical akinesis. Of note he is being followed for prostate cancer which is being watched expectantly. He has had radiation for vocal cord cancer.  This is being followed and he has some polyps on his vocal cord  At the last visit he was complaining of some chest discomfort. I started Imdur. He said since that time he's had very infrequent chest discomfort. He probably hasn't had any for about 2 weeks. He is fatigued. He has chronic dyspnea. He doesn't sleep well. All 3 of his children along with 2 grandchildren have moved back into his house. This has added a level of stress.  Allergies  Allergen Reactions  . Statins Other (See Comments)    Muscle pain    Current Outpatient Prescriptions  Medication Sig Dispense Refill  . aspirin EC 81 MG tablet Take 1 tablet (81 mg total) by mouth daily.    Marland Kitchen ezetimibe (ZETIA) 10 MG tablet Take 1 tablet (10 mg total) by mouth daily. 30 tablet 5  . isosorbide mononitrate (IMDUR) 30 MG 24 hr tablet Take 1 tablet (30 mg total) by mouth daily. 30 tablet 11  . metoprolol tartrate (LOPRESSOR) 25 MG tablet Take 0.5 tablets (12.5 mg total) by mouth 2 (two) times daily. 30 tablet 5  . nitroGLYCERIN (NITROSTAT) 0.4 MG SL tablet Place 1 tablet (0.4 mg total) under the tongue every 5 (five) minutes as needed for chest pain (up to 3 doses). 25 tablet 5  . tamsulosin (FLOMAX) 0.4 MG CAPS capsule Take 0.4 mg by mouth daily.     . ticagrelor (BRILINTA) 90 MG TABS tablet Take 1 tablet (90 mg total) by mouth 2 (two) times daily. 60 tablet 6   No current facility-administered  medications for this visit.    Past Medical History  Diagnosis Date  . Hyperlipidemia     intolerant to statins  . PVD (peripheral vascular disease) ([place])     Aortobifemoral Bypass 2010, Thrombectomy and repair right brachial artery   . CAD (coronary artery disease) 12/2008    a. cath 11/2011 s/p DES to LAD b. anterior STEMI 09/04/2014 cath DES to occluded prox LAD, 40% AV groove LCx and 30% mid RCA  . CVA (cerebral infarction) 06/2009  . Elevated PSA 02/2012    6.54  . Tobacco abuse   . Unstable angina ([place])   . Cancer [facility]) 06/2012    prostate, larynx  . Radiation 08/10/2012-09/22/2012    6525 cGy to larynx    Past Surgical History  Procedure Laterality Date  . Aortobifemoral bypass  2010  . 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 Larynx biopsy  07/12/12  . Prostate biopsy  07/12/12    Gleason's grade 3+3+6  . Coronary angioplasty with stent placement  11/2011  . Left heart catheterization with coronary angiogram N/A 12/17/2011    Procedure: LEFT HEART CATHETERIZATION WITH CORONARY ANGIOGRAM;  Surgeon: Hillary Bow, MD;  Location: [facility] CATH LAB;  Service: Cardiovascular;  Laterality: N/A;  . Percutaneous coronary stent intervention (pci-s) N/A 12/17/2011  Procedure: PERCUTANEOUS CORONARY STENT INTERVENTION (PCI-S);  Surgeon: Hillary Bow, MD;  Location: [facility] CATH LAB;  Service: Cardiovascular;  Laterality: N/A;  . Left heart catheterization with coronary angiogram N/A 09/04/2014    Procedure: LEFT HEART CATHETERIZATION WITH CORONARY ANGIOGRAM;  Surgeon: Troy Sine, MD;  Location: [facility] CATH LAB;  Service: Cardiovascular;  Laterality: N/A;    ROS:  Fatigue.  Otherwise as stated in the HPI and negative for all other systems.  PHYSICAL EXAM BP 110/72 mmHg  Pulse 67  Ht 5\' 9"  (1.753 m)  Wt 199 lb (90.266 kg)  BMI 29.37 kg/m2GENERAL:  Well appearing NECK:  No jugular venous distention, waveform within normal  limits, carotid upstroke brisk and symmetric, no bruits, no thyromegaly LYMPHATICS:  No cervical, inguinal adenopathy LUNGS:  Clear to auscultation bilaterally CHEST:  Unremarkable HEART:  PMI not displaced or sustained,S1 and S2 within normal limits, no S3, no S4, no clicks, no rubs, no murmurs ABD:  Flat, positive bowel sounds normal in frequency in pitch, no bruits, no rebound, no guarding, no midline pulsatile mass, no hepatomegaly, no splenomegaly EXT:  2 plus pulses throughout, no edema, no cyanosis no clubbing   EKG: Sinus rhythm, rate 67, axis within normal limits, intervals within normal limits, old anteroseptal infarct. 05/29/2015  ASSESSMENT AND PLAN  CAD -  He is doing better since starting Imdur.  He's going to remain indefinitely on DAPT.  He failed Plavix before. The suggestion has been lifelong DAPT.   Hyperlipidemia -  As above. He is truly statin intolerant. He can't afford the PCSK9.   For now he will remain on Zetia.  Tobacco abuse -  He unfortunately can't stop smoking . He was not able to stop despite getting patches and Zyban by the [place].  Ischemic cardiomyopathy - If his BP allows I will try mid titration in the future.

## 2015-05-29 NOTE — Patient Instructions (Signed)
Medication Instructions:  The current medical regimen is effective;  continue present plan and medications.  Follow-Up: Follow up in 6 months with Dr. Hochrein.  You will receive a letter in the mail 2 months before you are due.  Please call us when you receive this letter to schedule your follow up appointment.  If you need a refill on your cardiac medications before your next appointment, please call your pharmacy.  Thank you for choosing [facility]!!       

## 2015-06-27 ENCOUNTER — Telehealth: Payer: Self-pay | Admitting: Cardiology

## 2015-06-27 ENCOUNTER — Encounter: Payer: Self-pay | Admitting: Family Medicine

## 2015-06-27 ENCOUNTER — Ambulatory Visit (INDEPENDENT_AMBULATORY_CARE_PROVIDER_SITE_OTHER): Payer: Medicare HMO | Admitting: Family Medicine

## 2015-06-27 VITALS — BP 119/73 | HR 54 | Temp 98.0°F | Ht 69.0 in | Wt 195.0 lb

## 2015-06-27 DIAGNOSIS — J209 Acute bronchitis, unspecified: Secondary | ICD-10-CM | POA: Diagnosis not present

## 2015-06-27 DIAGNOSIS — Z716 Tobacco abuse counseling: Secondary | ICD-10-CM

## 2015-06-27 DIAGNOSIS — Z720 Tobacco use: Secondary | ICD-10-CM | POA: Diagnosis not present

## 2015-06-27 MED ORDER — ALBUTEROL SULFATE HFA 108 (90 BASE) MCG/ACT IN AERS: 2.0000 | INHALATION_SPRAY | Freq: Four times a day (QID) | RESPIRATORY_TRACT | Status: DC | PRN

## 2015-06-27 MED ORDER — AZITHROMYCIN 250 MG PO TABS: ORAL_TABLET | ORAL | Status: DC

## 2015-06-27 NOTE — Telephone Encounter (Signed)
Spoke to wife   Biopsy has not been schedule awaiting for Dr Percival Spanish decision Dr Rosana Hoes ( urologist) at [facility] will be doing biopsy.   wife aware will defer to Dr Percival Spanish

## 2015-06-27 NOTE — Progress Notes (Signed)
BP 119/73 mmHg  Pulse 54  Temp(Src) 98 F (36.7 C) (Oral)  Ht 5\' 9"  (1.753 m)  Wt 195 lb (88.451 kg)  BMI 28.78 kg/m2   Subjective:    Patient ID: [name], male    DOB: Apr 07, 1951, 64 y.o.   MRN: [id number]  HPI: [name] is a 64 y.o. male presenting on 06/27/2015 for Chest congestion, cough and Sinusitis   HPI Chest congestion and cough and sinus pressure Patient is been having chest congestion and cough and sinus pressure and postnasal drainage that is been worsening over the past 3 weeks. He denies any fevers or chills. He denies any shortness of breath or wheezing. He is a known smoker chronically and has tried to stop previously and plans to go try to stop the new year again. He goes to the [place] for that. His cough is productive of green sputum. He denies any sick contacts that he knows of.  Relevant past medical, surgical, family and social history reviewed and updated as indicated. Interim medical history since our last visit reviewed. Allergies and medications reviewed and updated.  Review of Systems  Constitutional: Negative for fever and chills.  HENT: Positive for congestion, postnasal drip, rhinorrhea, sinus pressure and sore throat. Negative for ear discharge, ear pain, sneezing and voice change.   Eyes: Negative for pain, discharge, redness and visual disturbance.  Respiratory: Positive for cough. Negative for chest tightness, shortness of breath and wheezing.   Cardiovascular: Negative for chest pain and leg swelling.  Gastrointestinal: Negative for abdominal pain, diarrhea and constipation.  Genitourinary: Negative for difficulty urinating.  Musculoskeletal: Negative for back pain and gait problem.  Skin: Negative for rash.  Neurological: Negative for syncope, light-headedness and headaches.  All other systems reviewed and are negative.   Per HPI unless specifically indicated above     Medication List       This list is accurate as of:  06/27/15  1:46 PM.  Always use your most recent med list.               albuterol 108 (90 Base) MCG/ACT inhaler  Commonly known as:  PROVENTIL HFA;VENTOLIN HFA  Inhale 2 puffs into the lungs every 6 (six) hours as needed for wheezing or shortness of breath.     aspirin EC 81 MG tablet  Take 1 tablet (81 mg total) by mouth daily.     azithromycin 250 MG tablet  Commonly known as:  ZITHROMAX  Take 2 the first day and then one each day after.     ezetimibe 10 MG tablet  Commonly known as:  ZETIA  Take 1 tablet (10 mg total) by mouth daily.     FLOMAX 0.4 MG Caps capsule  Generic drug:  tamsulosin  Take 0.4 mg by mouth daily.     isosorbide mononitrate 30 MG 24 hr tablet  Commonly known as:  IMDUR  Take 1 tablet (30 mg total) by mouth daily.     metoprolol tartrate 25 MG tablet  Commonly known as:  LOPRESSOR  Take 0.5 tablets (12.5 mg total) by mouth 2 (two) times daily.     nitroGLYCERIN 0.4 MG SL tablet  Commonly known as:  NITROSTAT  Place 1 tablet (0.4 mg total) under the tongue every 5 (five) minutes as needed for chest pain (up to 3 doses).           Objective:    BP 119/73 mmHg  Pulse 54  Temp(Src) 98 F (36.7 C) (  Oral)  Ht 5\' 9"  (1.753 m)  Wt 195 lb (88.451 kg)  BMI 28.78 kg/m2  Wt Readings from Last 3 Encounters:  06/27/15 195 lb (88.451 kg)  05/29/15 199 lb (90.266 kg)  02/20/15 191 lb (86.637 kg)    Physical Exam  Constitutional: He is oriented to person, place, and time. He appears well-developed and well-nourished. No distress.  HENT:  Right Ear: Tympanic membrane, external ear and ear canal normal.  Left Ear: Tympanic membrane, external ear and ear canal normal.  Nose: Mucosal edema and rhinorrhea present. No sinus tenderness. No epistaxis. Right sinus exhibits maxillary sinus tenderness and frontal sinus tenderness. Left sinus exhibits maxillary sinus tenderness and frontal sinus tenderness.  Mouth/Throat: Uvula is midline and mucous membranes  are normal. Posterior oropharyngeal edema and posterior oropharyngeal erythema present. No oropharyngeal exudate or tonsillar abscesses.  Eyes: Conjunctivae and EOM are normal. Pupils are equal, round, and reactive to light. Right eye exhibits no discharge. No scleral icterus.  Neck: Neck supple. No thyromegaly present.  Cardiovascular: Normal rate, regular rhythm, normal heart sounds and intact distal pulses.   No murmur heard. Pulmonary/Chest: Effort normal and breath sounds normal. No respiratory distress. He has no wheezes. He has no rales.  Musculoskeletal: Normal range of motion. He exhibits no edema.  Lymphadenopathy:    He has no cervical adenopathy.  Neurological: He is alert and oriented to person, place, and time. Coordination normal.  Skin: Skin is warm and dry. No rash noted. He is not diaphoretic.  Psychiatric: He has a normal mood and affect. His behavior is normal.  Nursing note and vitals reviewed.      Assessment & Plan:   Problem List Items Addressed This Visit    None    Visit Diagnoses    Acute bronchitis, unspecified organism    -  Primary    History of smoking, will give albuterol inhaler plus Zithromax.    Relevant Medications    azithromycin (ZITHROMAX) 250 MG tablet    albuterol (PROVENTIL HFA;VENTOLIN HFA) 108 (90 Base) MCG/ACT inhaler    Encounter for smoking cessation counseling        He will go to the [place] to try smoking cessation programs.        Follow up plan: Return if symptoms worsen or fail to improve.  Counseling provided for all of the vaccine components No orders of the defined types were placed in this encounter.    Caryl Pina, MD [facility] 06/27/2015, 1:46 PM

## 2015-06-27 NOTE — Telephone Encounter (Signed)
Pt needs to have biopsy for prostate,needs to know what to do about his Brilinta.

## 2015-06-28 NOTE — Telephone Encounter (Signed)
I spoke with the patient.  I need to talk to Dr. Rosana Hoes  [phone]).  He was not in today.  I need to call later this week to find out the urgency of this procedure since the patient is less than 12 months out from DES.

## 2015-07-29 DIAGNOSIS — R9721 Rising PSA following treatment for malignant neoplasm of prostate: Secondary | ICD-10-CM | POA: Diagnosis not present

## 2015-07-30 DIAGNOSIS — C320 Malignant neoplasm of glottis: Secondary | ICD-10-CM | POA: Diagnosis not present

## 2015-07-30 DIAGNOSIS — Z720 Tobacco use: Secondary | ICD-10-CM | POA: Diagnosis not present

## 2015-08-01 NOTE — Telephone Encounter (Signed)
I spoke with his urologist Dr. Rosana Hoes.  The patient is being scheduled for prostate biopsy.

## 2015-08-16 ENCOUNTER — Ambulatory Visit: Payer: Medicare HMO | Admitting: Neurology

## 2015-08-19 ENCOUNTER — Encounter: Payer: Self-pay | Admitting: Neurology

## 2015-09-27 DIAGNOSIS — R001 Bradycardia, unspecified: Secondary | ICD-10-CM | POA: Diagnosis not present

## 2015-09-27 DIAGNOSIS — K219 Gastro-esophageal reflux disease without esophagitis: Secondary | ICD-10-CM | POA: Diagnosis not present

## 2015-09-27 DIAGNOSIS — E785 Hyperlipidemia, unspecified: Secondary | ICD-10-CM | POA: Diagnosis not present

## 2015-09-27 DIAGNOSIS — J42 Unspecified chronic bronchitis: Secondary | ICD-10-CM | POA: Diagnosis not present

## 2015-09-27 DIAGNOSIS — F17210 Nicotine dependence, cigarettes, uncomplicated: Secondary | ICD-10-CM | POA: Diagnosis not present

## 2015-09-27 DIAGNOSIS — I252 Old myocardial infarction: Secondary | ICD-10-CM | POA: Diagnosis not present

## 2015-09-27 DIAGNOSIS — I2510 Atherosclerotic heart disease of native coronary artery without angina pectoris: Secondary | ICD-10-CM | POA: Diagnosis not present

## 2015-09-27 DIAGNOSIS — I739 Peripheral vascular disease, unspecified: Secondary | ICD-10-CM | POA: Diagnosis not present

## 2015-09-27 DIAGNOSIS — C61 Malignant neoplasm of prostate: Secondary | ICD-10-CM | POA: Diagnosis not present

## 2015-09-27 DIAGNOSIS — I255 Ischemic cardiomyopathy: Secondary | ICD-10-CM | POA: Diagnosis not present

## 2015-09-27 DIAGNOSIS — R9431 Abnormal electrocardiogram [ECG] [EKG]: Secondary | ICD-10-CM | POA: Diagnosis not present

## 2015-09-27 DIAGNOSIS — Z01810 Encounter for preprocedural cardiovascular examination: Secondary | ICD-10-CM | POA: Diagnosis not present

## 2015-10-09 DIAGNOSIS — J42 Unspecified chronic bronchitis: Secondary | ICD-10-CM | POA: Diagnosis not present

## 2015-10-09 DIAGNOSIS — I255 Ischemic cardiomyopathy: Secondary | ICD-10-CM | POA: Diagnosis not present

## 2015-10-09 DIAGNOSIS — I739 Peripheral vascular disease, unspecified: Secondary | ICD-10-CM | POA: Diagnosis not present

## 2015-10-09 DIAGNOSIS — I252 Old myocardial infarction: Secondary | ICD-10-CM | POA: Diagnosis not present

## 2015-10-09 DIAGNOSIS — E785 Hyperlipidemia, unspecified: Secondary | ICD-10-CM | POA: Diagnosis not present

## 2015-10-09 DIAGNOSIS — F17210 Nicotine dependence, cigarettes, uncomplicated: Secondary | ICD-10-CM | POA: Diagnosis not present

## 2015-10-09 DIAGNOSIS — K219 Gastro-esophageal reflux disease without esophagitis: Secondary | ICD-10-CM | POA: Diagnosis not present

## 2015-10-09 DIAGNOSIS — I2510 Atherosclerotic heart disease of native coronary artery without angina pectoris: Secondary | ICD-10-CM | POA: Diagnosis not present

## 2015-10-09 DIAGNOSIS — C61 Malignant neoplasm of prostate: Secondary | ICD-10-CM | POA: Diagnosis not present

## 2015-10-22 DIAGNOSIS — C320 Malignant neoplasm of glottis: Secondary | ICD-10-CM | POA: Diagnosis not present

## 2015-10-23 ENCOUNTER — Ambulatory Visit: Payer: Medicare HMO | Admitting: Neurology

## 2015-10-29 DIAGNOSIS — I2510 Atherosclerotic heart disease of native coronary artery without angina pectoris: Secondary | ICD-10-CM | POA: Diagnosis not present

## 2015-10-29 DIAGNOSIS — I252 Old myocardial infarction: Secondary | ICD-10-CM | POA: Diagnosis not present

## 2015-10-29 DIAGNOSIS — Z923 Personal history of irradiation: Secondary | ICD-10-CM | POA: Diagnosis not present

## 2015-10-29 DIAGNOSIS — Z7982 Long term (current) use of aspirin: Secondary | ICD-10-CM | POA: Diagnosis not present

## 2015-10-29 DIAGNOSIS — F17210 Nicotine dependence, cigarettes, uncomplicated: Secondary | ICD-10-CM | POA: Diagnosis not present

## 2015-10-29 DIAGNOSIS — Z955 Presence of coronary angioplasty implant and graft: Secondary | ICD-10-CM | POA: Diagnosis not present

## 2015-10-29 DIAGNOSIS — Z7902 Long term (current) use of antithrombotics/antiplatelets: Secondary | ICD-10-CM | POA: Diagnosis not present

## 2015-10-29 DIAGNOSIS — Z8673 Personal history of transient ischemic attack (TIA), and cerebral infarction without residual deficits: Secondary | ICD-10-CM | POA: Diagnosis not present

## 2015-10-29 DIAGNOSIS — C61 Malignant neoplasm of prostate: Secondary | ICD-10-CM | POA: Diagnosis not present

## 2015-10-30 ENCOUNTER — Telehealth: Payer: Self-pay | Admitting: Cardiology

## 2015-10-30 DIAGNOSIS — R972 Elevated prostate specific antigen [PSA]: Secondary | ICD-10-CM | POA: Diagnosis not present

## 2015-10-30 DIAGNOSIS — C61 Malignant neoplasm of prostate: Secondary | ICD-10-CM | POA: Diagnosis not present

## 2015-10-30 NOTE — Telephone Encounter (Signed)
New Message  Pt called has to decide if a radiation or an operation is needed for his prostate cancer. Request a call back to discuss if his heart will be ok with either of these proceedures

## 2015-10-30 NOTE — Telephone Encounter (Signed)
Pt states he has been diagnosed with prostate cancer. Pt states he has been given 3 treatment options by oncologist:  Internal radiation External radiation Prostatectomy  Pt states he is seeing his urologist today at [address] to discuss options with them. Pt states he has not been told the best option, the choice is up to him. Pt is being treated in [facility] in [facility].  Pt is asking for Dr Hochrein's opinion about his surgical risk from a cardiology standpoint before he makes a decision about treatment.   Pt advised I will forward to Dr Percival Spanish for review.

## 2015-10-31 NOTE — Telephone Encounter (Signed)
Schedule follow up with me to discuss risk and plan for anticoagulation.

## 2015-10-31 NOTE — Telephone Encounter (Signed)
Good Morning [name] this is a [name] pt can you please schedule appt in [place] for Dr Percival Spanish to see pt soon.

## 2015-10-31 NOTE — Telephone Encounter (Signed)
appt scheduled for 5/24 2:45 pm in [place].  Pt is aware and agreeable.

## 2015-11-20 ENCOUNTER — Encounter: Payer: Self-pay | Admitting: Cardiology

## 2015-11-20 ENCOUNTER — Ambulatory Visit (INDEPENDENT_AMBULATORY_CARE_PROVIDER_SITE_OTHER): Payer: Commercial Managed Care - HMO | Admitting: Cardiology

## 2015-11-20 VITALS — BP 114/70 | HR 57 | Ht 69.0 in | Wt 192.0 lb

## 2015-11-20 DIAGNOSIS — I255 Ischemic cardiomyopathy: Secondary | ICD-10-CM

## 2015-11-20 NOTE — Progress Notes (Signed)
HPI The patient presents for follow up of CAD.  He was hospitalized in March of last year with an acute anterior myocardial infarction.  Catheterization demonstrated total occlusion of the LAD proximal to his previously placed stent. He had 30% RCA stenosis and 40% stenosis elsewhere. He was on Plavix at that time and seems to have failed that medication. He did have an ejection fraction of about 45% with anteroapical akinesis. Of note he is being followed for prostate cancer and needs treatment.  He would like to have prostatectomy which was offered as one course of treatment.  Since I last saw her she has done well.  The patient denies any new symptoms such as chest discomfort, neck or arm discomfort. There has been no new shortness of breath, PND or orthopnea. There have been no reported palpitations, presyncope or syncope.  He is doing some walking.     Allergies  Allergen Reactions  . Statins Other (See Comments)    Muscle pain    Current Outpatient Prescriptions  Medication Sig Dispense Refill  . albuterol (PROVENTIL HFA;VENTOLIN HFA) 108 (90 Base) MCG/ACT inhaler Inhale 2 puffs into the lungs every 6 (six) hours as needed for wheezing or shortness of breath. 1 Inhaler 0  . aspirin EC 81 MG tablet Take 1 tablet (81 mg total) by mouth daily.    Marland Kitchen ezetimibe (ZETIA) 10 MG tablet Take 1 tablet (10 mg total) by mouth daily. 30 tablet 5  . isosorbide mononitrate (IMDUR) 30 MG 24 hr tablet Take 1 tablet (30 mg total) by mouth daily. 30 tablet 11  . metoprolol tartrate (LOPRESSOR) 25 MG tablet Take 0.5 tablets (12.5 mg total) by mouth 2 (two) times daily. 30 tablet 5  . nitroGLYCERIN (NITROSTAT) 0.4 MG SL tablet Place 1 tablet (0.4 mg total) under the tongue every 5 (five) minutes as needed for chest pain (up to 3 doses). 25 tablet 5  . tamsulosin (FLOMAX) 0.4 MG CAPS capsule Take 0.4 mg by mouth daily.     . ticagrelor (BRILINTA) 90 MG TABS tablet Take 90 mg by mouth 2 (two) times daily.       No current facility-administered medications for this visit.    Past Medical History  Diagnosis Date  . Hyperlipidemia     intolerant to statins  . PVD (peripheral vascular disease) ([place])     Aortobifemoral Bypass 2010, Thrombectomy and repair right brachial artery   . CAD (coronary artery disease) 12/2008    a. cath 11/2011 s/p DES to LAD b. anterior STEMI 09/04/2014 cath DES to occluded prox LAD, 40% AV groove LCx and 30% mid RCA  . CVA (cerebral infarction) 06/2009  . Elevated PSA 02/2012    6.54  . Tobacco abuse   . Unstable angina ([place])   . Cancer [facility]) 06/2012    prostate, larynx  . Radiation 08/10/2012-09/22/2012    6525 cGy to larynx    Past Surgical History  Procedure Laterality Date  . Aortobifemoral bypass  2010  . 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 Larynx biopsy  07/12/12  . Prostate biopsy  07/12/12    Gleason's grade 3+3+6  . Coronary angioplasty with stent placement  11/2011  . Left heart catheterization with coronary angiogram N/A 12/17/2011    Procedure: LEFT HEART CATHETERIZATION WITH CORONARY ANGIOGRAM;  Surgeon: Hillary Bow, MD;  Location: [facility] CATH LAB;  Service: Cardiovascular;  Laterality: N/A;  .  Percutaneous coronary stent intervention (pci-s) N/A 12/17/2011    Procedure: PERCUTANEOUS CORONARY STENT INTERVENTION (PCI-S);  Surgeon: Hillary Bow, MD;  Location[facility] CATH LAB;  Service: Cardiovascular;  Laterality: N/A;  . Left heart catheterization with coronary angiogram N/A 09/04/2014    Procedure: LEFT HEART CATHETERIZATION WITH CORONARY ANGIOGRAM;  Surgeon: Troy Sine, MD;  Location[facility] CATH LAB;  Service: Cardiovascular;  Laterality: N/A;    ROS:  Fatigue.  Otherwise as stated in the HPI and negative for all other systems.  PHYSICAL EXAM BP 114/70 mmHg  Pulse 57  Ht 5\' 9"  (1.753 m)  Wt 192 lb (87.091 kg)  BMI 28.34 kg/m2GENERAL:  Well appearing NECK:  No jugular venous  distention, waveform within normal limits, carotid upstroke brisk and symmetric, no bruits, no thyromegaly LYMPHATICS:  No cervical, inguinal adenopathy LUNGS:  Clear to auscultation bilaterally CHEST:  Unremarkable HEART:  PMI not displaced or sustained,S1 and S2 within normal limits, no S3, no S4, no clicks, no rubs, no murmurs ABD:  Flat, positive bowel sounds normal in frequency in pitch, no bruits, no rebound, no guarding, no midline pulsatile mass, no hepatomegaly, no splenomegaly EXT:  2 plus pulses throughout, no edema, no cyanosis no clubbing   EKG: Sinus rhythm, rate 57, axis within normal limits, intervals within normal limits, old anteroseptal infarct. 11/20/2015  ASSESSMENT AND PLAN  CAD -  The patient has no ongoing symptoms.  I would like to screen him with a POET (Plain Old Exercise Treadmill) prior to his surgery.  However, if there are no high risk findings on this he would be at acceptable risk for the planned procedure and can hold his Brilinta prior to the procedure.  I would resume DAPT afterward.   Hyperlipidemia -  As above. He is truly statin intolerant. He can't afford the PCSK9.   For now he will remain on Zetia.  Tobacco abuse -  He unfortunately can't stop smoking . He was not able to stop despite getting patches and Zyban by the [place].    Ischemic cardiomyopathy - His BP has not allowed med titration.  I will continue meds as listed.  I will repeat an echo prior to the surgery as well.

## 2015-11-20 NOTE — Patient Instructions (Signed)
Medication Instructions:  The current medical regimen is effective;  continue present plan and medications.  Testing/Procedures: Your physician has requested that you have an echocardiogram. Echocardiography is a painless test that uses sound waves to create images of your heart. It provides your doctor with information about the size and shape of your heart and how well your heart's chambers and valves are working. This procedure takes approximately one hour. There are no restrictions for this procedure.  Your physician has requested that you have an exercise tolerance test. For further information please visit [email or web address]. Please also follow instruction sheet, as given.  Follow-Up: Further follow up with Dr Percival Spanish in [place] will be based on these results.  If you need a refill on your cardiac medications before your next appointment, please call your pharmacy.  Thank you for choosing [facility]!!

## 2015-11-21 DIAGNOSIS — C61 Malignant neoplasm of prostate: Secondary | ICD-10-CM | POA: Diagnosis not present

## 2015-11-21 DIAGNOSIS — R972 Elevated prostate specific antigen [PSA]: Secondary | ICD-10-CM | POA: Diagnosis not present

## 2015-12-12 ENCOUNTER — Ambulatory Visit (HOSPITAL_COMMUNITY): Payer: Commercial Managed Care - HMO | Attending: Cardiovascular Disease

## 2015-12-12 ENCOUNTER — Ambulatory Visit (INDEPENDENT_AMBULATORY_CARE_PROVIDER_SITE_OTHER): Payer: Commercial Managed Care - HMO

## 2015-12-12 ENCOUNTER — Other Ambulatory Visit: Payer: Self-pay

## 2015-12-12 DIAGNOSIS — E785 Hyperlipidemia, unspecified: Secondary | ICD-10-CM | POA: Insufficient documentation

## 2015-12-12 DIAGNOSIS — I252 Old myocardial infarction: Secondary | ICD-10-CM | POA: Diagnosis not present

## 2015-12-12 DIAGNOSIS — Z8249 Family history of ischemic heart disease and other diseases of the circulatory system: Secondary | ICD-10-CM | POA: Insufficient documentation

## 2015-12-12 DIAGNOSIS — I517 Cardiomegaly: Secondary | ICD-10-CM | POA: Diagnosis not present

## 2015-12-12 DIAGNOSIS — R29898 Other symptoms and signs involving the musculoskeletal system: Secondary | ICD-10-CM | POA: Insufficient documentation

## 2015-12-12 DIAGNOSIS — I255 Ischemic cardiomyopathy: Secondary | ICD-10-CM

## 2015-12-12 DIAGNOSIS — I2510 Atherosclerotic heart disease of native coronary artery without angina pectoris: Secondary | ICD-10-CM | POA: Diagnosis not present

## 2015-12-12 DIAGNOSIS — Z720 Tobacco use: Secondary | ICD-10-CM | POA: Diagnosis not present

## 2015-12-12 LAB — ECHOCARDIOGRAM COMPLETE
AO mean calculated velocity dopler: 94.5 cm/s
AOVTI: 33.5 cm
AV Area VTI index: 1.76 cm2/m2
AV Area VTI: 3.41 cm2
AV Area mean vel: 3.2 cm2
AV Peak grad: 9 mmHg
AV area mean vel ind: 1.57 cm2/m2
AV peak Index: 1.68
AVA: 3.58 cm2
AVCELMEANRAT: 0.71
AVG: 4 mmHg
AVPKVEL: 146 cm/s
Ao pk vel: 0.75 m/s
Ao-asc: 34 cm
CHL CUP AV VEL: 3.58
CHL CUP DOP CALC LVOT VTI: 26.5 cm
CHL CUP MV DEC (S): 285
EERAT: 8.86
EWDT: 285 ms
FS: 33 % (ref 28–44)
IV/PV OW: 0.98
LA ID, A-P, ES: 33 mm
LADIAMINDEX: 1.63 cm/m2
LAVOL: 39.5 mL
LAVOLA4C: 56.4 mL
LAVOLIN: 19.5 mL/m2
LEFT ATRIUM END SYS DIAM: 33 mm
LV TDI E'LATERAL: 7.72
LV e' LATERAL: 7.72 cm/s
LVEEAVG: 8.86
LVEEMED: 8.86
LVOT area: 4.52 cm2
LVOT diameter: 24 mm
LVOT peak VTI: 0.79 cm
LVOTPV: 110 cm/s
LVOTSV: 120 mL
MV pk A vel: 86.3 m/s
MV pk E vel: 68.4 m/s
PW: 11.5 mm — AB (ref 0.6–1.1)
TAPSE: 23.4 mm
TDI e' medial: 6.42
Valve area index: 1.76

## 2015-12-12 LAB — EXERCISE TOLERANCE TEST
CSEPED: 5 min
CSEPEDS: 4 s
CSEPEW: 6.7 METS
CSEPPHR: 100 {beats}/min
MPHR: 157 {beats}/min
Percent HR: 63 %
RPE: 15
Rest HR: 50 {beats}/min

## 2015-12-25 DIAGNOSIS — F17210 Nicotine dependence, cigarettes, uncomplicated: Secondary | ICD-10-CM | POA: Diagnosis not present

## 2015-12-25 DIAGNOSIS — Z8673 Personal history of transient ischemic attack (TIA), and cerebral infarction without residual deficits: Secondary | ICD-10-CM | POA: Diagnosis not present

## 2015-12-25 DIAGNOSIS — I739 Peripheral vascular disease, unspecified: Secondary | ICD-10-CM | POA: Diagnosis not present

## 2015-12-25 DIAGNOSIS — R972 Elevated prostate specific antigen [PSA]: Secondary | ICD-10-CM | POA: Diagnosis not present

## 2015-12-25 DIAGNOSIS — E785 Hyperlipidemia, unspecified: Secondary | ICD-10-CM | POA: Diagnosis not present

## 2015-12-25 DIAGNOSIS — C61 Malignant neoplasm of prostate: Secondary | ICD-10-CM | POA: Diagnosis not present

## 2015-12-25 DIAGNOSIS — Z8521 Personal history of malignant neoplasm of larynx: Secondary | ICD-10-CM | POA: Diagnosis not present

## 2015-12-25 DIAGNOSIS — Z01818 Encounter for other preprocedural examination: Secondary | ICD-10-CM | POA: Diagnosis not present

## 2015-12-25 DIAGNOSIS — I2510 Atherosclerotic heart disease of native coronary artery without angina pectoris: Secondary | ICD-10-CM | POA: Diagnosis not present

## 2015-12-28 HISTORY — PX: PROSTATECTOMY: SHX69

## 2016-01-01 DIAGNOSIS — C61 Malignant neoplasm of prostate: Secondary | ICD-10-CM | POA: Diagnosis not present

## 2016-01-01 DIAGNOSIS — R52 Pain, unspecified: Secondary | ICD-10-CM | POA: Diagnosis not present

## 2016-01-01 DIAGNOSIS — I745 Embolism and thrombosis of iliac artery: Secondary | ICD-10-CM | POA: Diagnosis not present

## 2016-01-01 DIAGNOSIS — I69354 Hemiplegia and hemiparesis following cerebral infarction affecting left non-dominant side: Secondary | ICD-10-CM | POA: Diagnosis not present

## 2016-01-01 DIAGNOSIS — I998 Other disorder of circulatory system: Secondary | ICD-10-CM | POA: Diagnosis not present

## 2016-01-01 DIAGNOSIS — N419 Inflammatory disease of prostate, unspecified: Secondary | ICD-10-CM | POA: Diagnosis not present

## 2016-01-01 DIAGNOSIS — F17200 Nicotine dependence, unspecified, uncomplicated: Secondary | ICD-10-CM | POA: Diagnosis not present

## 2016-01-01 DIAGNOSIS — I255 Ischemic cardiomyopathy: Secondary | ICD-10-CM | POA: Diagnosis not present

## 2016-01-01 DIAGNOSIS — I7409 Other arterial embolism and thrombosis of abdominal aorta: Secondary | ICD-10-CM | POA: Diagnosis not present

## 2016-01-01 DIAGNOSIS — D62 Acute posthemorrhagic anemia: Secondary | ICD-10-CM | POA: Diagnosis not present

## 2016-01-01 DIAGNOSIS — I743 Embolism and thrombosis of arteries of the lower extremities: Secondary | ICD-10-CM | POA: Diagnosis not present

## 2016-01-01 DIAGNOSIS — E785 Hyperlipidemia, unspecified: Secondary | ICD-10-CM | POA: Diagnosis not present

## 2016-01-01 DIAGNOSIS — T82868A Thrombosis of vascular prosthetic devices, implants and grafts, initial encounter: Secondary | ICD-10-CM | POA: Diagnosis not present

## 2016-01-01 DIAGNOSIS — E872 Acidosis: Secondary | ICD-10-CM | POA: Diagnosis not present

## 2016-01-01 DIAGNOSIS — I358 Other nonrheumatic aortic valve disorders: Secondary | ICD-10-CM | POA: Diagnosis not present

## 2016-01-01 DIAGNOSIS — I517 Cardiomegaly: Secondary | ICD-10-CM | POA: Diagnosis not present

## 2016-01-01 DIAGNOSIS — I10 Essential (primary) hypertension: Secondary | ICD-10-CM | POA: Diagnosis not present

## 2016-01-01 DIAGNOSIS — I2510 Atherosclerotic heart disease of native coronary artery without angina pectoris: Secondary | ICD-10-CM | POA: Diagnosis not present

## 2016-01-01 DIAGNOSIS — N201 Calculus of ureter: Secondary | ICD-10-CM | POA: Diagnosis not present

## 2016-01-01 DIAGNOSIS — N4231 Prostatic intraepithelial neoplasia: Secondary | ICD-10-CM | POA: Diagnosis not present

## 2016-01-01 DIAGNOSIS — I748 Embolism and thrombosis of other arteries: Secondary | ICD-10-CM | POA: Diagnosis not present

## 2016-01-03 ENCOUNTER — Telehealth: Payer: Self-pay | Admitting: Cardiology

## 2016-01-03 DIAGNOSIS — I6930 Unspecified sequelae of cerebral infarction: Secondary | ICD-10-CM | POA: Insufficient documentation

## 2016-01-03 NOTE — Telephone Encounter (Signed)
Would ask them to have cardiology at that facility see pt [name]

## 2016-01-03 NOTE — Telephone Encounter (Signed)
Received call from [facility].  This patient was admitted for service for robotic prostatectomy for prostate CA. He had thrombus detected in left leg, put on (and currently on a heparin drip) and in ICU.  They plan to move patient to step-down unit. They are informing us of admission and also requesting advice on appropriate anticoagulation therapy.  Pt has a background of cath w PCI in 2014, aortobifem bypass in 2010.  He was on Brilinta 90mg  BID prior to this recent admission.  Dr. Lendell Caprice can be reached at [phone]. Requests a call today Routed to [facility] - Dr. Percival Spanish out of office/LTW.

## 2016-01-03 NOTE — Telephone Encounter (Addendum)
I have returned Dr. Naoma Diener call and given advisement - Dr. Lendell Caprice voiced understanding and thanks.

## 2016-01-09 DIAGNOSIS — R339 Retention of urine, unspecified: Secondary | ICD-10-CM | POA: Diagnosis not present

## 2016-01-10 ENCOUNTER — Encounter: Payer: Self-pay | Admitting: Family Medicine

## 2016-01-10 ENCOUNTER — Ambulatory Visit (INDEPENDENT_AMBULATORY_CARE_PROVIDER_SITE_OTHER): Payer: Commercial Managed Care - HMO | Admitting: Family Medicine

## 2016-01-10 VITALS — BP 95/64 | HR 69 | Temp 98.3°F | Ht 69.0 in | Wt 191.8 lb

## 2016-01-10 DIAGNOSIS — Z9079 Acquired absence of other genital organ(s): Secondary | ICD-10-CM | POA: Diagnosis not present

## 2016-01-10 DIAGNOSIS — I739 Peripheral vascular disease, unspecified: Secondary | ICD-10-CM

## 2016-01-10 NOTE — Progress Notes (Signed)
BP 95/64 mmHg  Pulse 69  Temp(Src) 98.3 F (36.8 C) (Oral)  Ht 5\' 9"  (1.753 m)  Wt 191 lb 12.8 oz (87 kg)  BMI 28.31 kg/m2   Subjective:    Patient ID: [name], male    DOB: [birth year], 65 y.o.   MRN: [id number]  HPI: [name] is a 65 y.o. male presenting on 01/10/2016 for Hospitalization Follow-up   HPI Hospital follow-up for prostatectomy and arterial insufficiency  Patient comes in today for hospital follow-up for a prostatectomy and arterial insufficiency. Patient was hospitalized initially for a procedure to have his prostate removed because of prostate cancer and then after the procedure when he awoke he had a cold dark left lower extremity with swelling. He also has significant pain and tenderness in that left lower extremity. He was found to have arterial insufficiency and a clot that would block the blood supply going down to that leg. He needed emergent surgery and revascularization procedures. He comes in today for hospital follow-up after discharge and has appropriate follow-up 3 days from now with vascular surgery. He says his leg is almost pain-free. The only major issue that he is having is after the prostatectomy he is having bladder control issues as having word diaper. This is being managed by urology. He still has the staples in place on both sides of his left lower leg and in his groin region and all are clean and to be healing nicely.  Relevant past medical, surgical, family and social history reviewed and updated as indicated. Interim medical history since our last visit reviewed. Allergies and medications reviewed and updated.  Review of Systems  Constitutional: Negative for fever.  HENT: Negative for ear discharge and ear pain.   Eyes: Negative for discharge and visual disturbance.  Respiratory: Negative for shortness of breath and wheezing.   Cardiovascular: Negative for chest pain and leg swelling.  Gastrointestinal: Negative for abdominal pain,  diarrhea and constipation.  Genitourinary: Positive for frequency. Negative for difficulty urinating.  Musculoskeletal: Negative for back pain and gait problem.  Skin: Positive for wound. Negative for color change and rash.  Neurological: Negative for syncope, light-headedness and headaches.  All other systems reviewed and are negative.  Per HPI unless specifically indicated above     Medication List       This list is accurate as of: 01/10/16  9:53 AM.  Always use your most recent med list.               albuterol 108 (90 Base) MCG/ACT inhaler  Commonly known as:  PROVENTIL HFA;VENTOLIN HFA  Inhale 2 puffs into the lungs every 6 (six) hours as needed for wheezing or shortness of breath.     aspirin EC 81 MG tablet  Take 1 tablet (81 mg total) by mouth daily.     ezetimibe 10 MG tablet  Commonly known as:  ZETIA  Take 1 tablet (10 mg total) by mouth daily.     FLOMAX 0.4 MG Caps capsule  Generic drug:  tamsulosin  Take 0.4 mg by mouth daily.     isosorbide mononitrate 30 MG 24 hr tablet  Commonly known as:  IMDUR  Take 1 tablet (30 mg total) by mouth daily.     metoprolol tartrate 25 MG tablet  Commonly known as:  LOPRESSOR  Take 0.5 tablets (12.5 mg total) by mouth 2 (two) times daily.     nitroGLYCERIN 0.4 MG SL tablet  Commonly known as:  NITROSTAT  Place  1 tablet (0.4 mg total) under the tongue every 5 (five) minutes as needed for chest pain (up to 3 doses).     ticagrelor 90 MG Tabs tablet  Commonly known as:  BRILINTA  Take 90 mg by mouth 2 (two) times daily.           Objective:    BP 95/64 mmHg  Pulse 69  Temp(Src) 98.3 F (36.8 C) (Oral)  Ht 5\' 9"  (1.753 m)  Wt 191 lb 12.8 oz (87 kg)  BMI 28.31 kg/m2  Wt Readings from Last 3 Encounters:  01/10/16 191 lb 12.8 oz (87 kg)  11/20/15 192 lb (87.091 kg)  06/27/15 195 lb (88.451 kg)    Physical Exam  Constitutional: He is oriented to person, place, and time. He appears well-developed and  well-nourished. No distress.  Eyes: Conjunctivae and EOM are normal. Pupils are equal, round, and reactive to light. Right eye exhibits no discharge. No scleral icterus.  Neck: Neck supple. No thyromegaly present.  Cardiovascular: Normal rate, regular rhythm and normal heart sounds.   No murmur heard. Pulses on left lower extremity are present but faint and more 1+  Pulmonary/Chest: Effort normal and breath sounds normal. No respiratory distress. He has no wheezes.  Abdominal: Soft. Bowel sounds are normal. He exhibits no distension. There is no tenderness. There is no rebound.  Musculoskeletal: Normal range of motion. He exhibits no edema.  Lymphadenopathy:    He has no cervical adenopathy.  Neurological: He is alert and oriented to person, place, and time. Coordination normal.  Skin: Skin is warm and dry. No rash noted. He is not diaphoretic.  Psychiatric: He has a normal mood and affect. His behavior is normal.  Nursing note and vitals reviewed.     Assessment & Plan:       Problem List Items Addressed This Visit    None    Visit Diagnoses    Insufficiency, arterial, peripheral ([place])    -  Primary    Postoperative prostatectomy patient had cold dead foot from arterial insufficiency, was revascularized and has faint pulses now. Has follow-up with vascular    Status post prostatectomy        Patient was in the hospital recently for a prostatectomy and then had some complications afterwards this is a hospital follow-up, he is doing well        Follow up plan: Return if symptoms worsen or fail to improve.  Counseling provided for all of the vaccine components No orders of the defined types were placed in this encounter.    Caryl Pina, MD [facility] 01/10/2016, 9:53 AM

## 2016-01-13 DIAGNOSIS — Z48812 Encounter for surgical aftercare following surgery on the circulatory system: Secondary | ICD-10-CM | POA: Diagnosis not present

## 2016-01-13 DIAGNOSIS — Z4802 Encounter for removal of sutures: Secondary | ICD-10-CM | POA: Diagnosis not present

## 2016-01-22 DIAGNOSIS — N201 Calculus of ureter: Secondary | ICD-10-CM | POA: Diagnosis not present

## 2016-01-22 DIAGNOSIS — N281 Cyst of kidney, acquired: Secondary | ICD-10-CM | POA: Diagnosis not present

## 2016-01-22 DIAGNOSIS — Z87442 Personal history of urinary calculi: Secondary | ICD-10-CM | POA: Diagnosis not present

## 2016-01-28 DIAGNOSIS — T82868A Thrombosis of vascular prosthetic devices, implants and grafts, initial encounter: Secondary | ICD-10-CM | POA: Insufficient documentation

## 2016-01-28 DIAGNOSIS — T82868D Thrombosis of vascular prosthetic devices, implants and grafts, subsequent encounter: Secondary | ICD-10-CM | POA: Diagnosis not present

## 2016-02-04 DIAGNOSIS — Z720 Tobacco use: Secondary | ICD-10-CM | POA: Diagnosis not present

## 2016-02-04 DIAGNOSIS — C61 Malignant neoplasm of prostate: Secondary | ICD-10-CM | POA: Diagnosis not present

## 2016-02-04 DIAGNOSIS — C320 Malignant neoplasm of glottis: Secondary | ICD-10-CM | POA: Diagnosis not present

## 2016-03-21 ENCOUNTER — Other Ambulatory Visit: Payer: Self-pay | Admitting: Cardiology

## 2016-06-09 DIAGNOSIS — Z720 Tobacco use: Secondary | ICD-10-CM | POA: Diagnosis not present

## 2016-06-09 DIAGNOSIS — C320 Malignant neoplasm of glottis: Secondary | ICD-10-CM | POA: Diagnosis not present

## 2016-07-17 ENCOUNTER — Encounter: Payer: Self-pay | Admitting: Family Medicine

## 2016-07-17 ENCOUNTER — Ambulatory Visit (INDEPENDENT_AMBULATORY_CARE_PROVIDER_SITE_OTHER): Payer: Medicare HMO | Admitting: Family Medicine

## 2016-07-17 ENCOUNTER — Encounter (INDEPENDENT_AMBULATORY_CARE_PROVIDER_SITE_OTHER): Payer: Self-pay

## 2016-07-17 VITALS — BP 115/73 | HR 60 | Temp 95.5°F | Ht 69.0 in | Wt 202.4 lb

## 2016-07-17 DIAGNOSIS — D229 Melanocytic nevi, unspecified: Secondary | ICD-10-CM | POA: Diagnosis not present

## 2016-07-17 DIAGNOSIS — R1031 Right lower quadrant pain: Secondary | ICD-10-CM | POA: Diagnosis not present

## 2016-07-17 DIAGNOSIS — I739 Peripheral vascular disease, unspecified: Secondary | ICD-10-CM

## 2016-07-17 DIAGNOSIS — L821 Other seborrheic keratosis: Secondary | ICD-10-CM | POA: Diagnosis not present

## 2016-07-17 NOTE — Progress Notes (Signed)
BP 115/73   Pulse 60   Temp (!) 95.5 F (35.3 C) (Oral)   Ht 5\' 9"  (1.753 m)   Wt 202 lb 6.4 oz (91.8 kg)   BMI 29.89 kg/m    Subjective:    Patient ID: [name], male    DOB: January 05, 1951, 66 y.o.   MRN: [id number]  HPI: [name] is a 66 y.o. male presenting on 07/17/2016 for Nevus (right abd/flank side); Abdominal Pain (lower left abd pain at groin incission, possible from bypass several years ago); and Knee Pain (leg pain from knee down since after prostectomy, foot gets cold)   HPI Cold left foot and leg pain Patient is coming in today for complaints of a cold left foot and pain in his left leg below his knee. He feels like that leg below his knee is also a lot colder than his right leg. He does have extensive cardiovascular history and he says he has been diagnosed with peripheral arterial disease. He says it has been happening more frequently over the past couple months but has been going on for much longer than that. The pains in his legs do occur when he is up and moving around.  New skin lesions Patient has 2 skin lesions on his left lower back that have been changing and increasing in size over the past short while. Both are very pruritic and his wife is also concerned about them and she has been watching them for him.  Right groin pain Patient has some right groin pain near the incision site where he had a vein removed for his bypass. He says the groin pain has been sharp at times and intermittent. He denies any radiating pain or any numbness or weakness in that leg. He is mostly just that pain that he gets right in the groin.  Relevant past medical, surgical, family and social history reviewed and updated as indicated. Interim medical history since our last visit reviewed. Allergies and medications reviewed and updated.  Review of Systems  Constitutional: Negative for chills and fever.  Respiratory: Negative for shortness of breath and wheezing.     Cardiovascular: Negative for chest pain and leg swelling.  Musculoskeletal: Positive for myalgias. Negative for back pain and gait problem.  Skin: Negative for color change, rash and wound.  All other systems reviewed and are negative.   Per HPI unless specifically indicated above     Objective:    BP 115/73   Pulse 60   Temp (!) 95.5 F (35.3 C) (Oral)   Ht 5\' 9"  (1.753 m)   Wt 202 lb 6.4 oz (91.8 kg)   BMI 29.89 kg/m   Wt Readings from Last 3 Encounters:  07/17/16 202 lb 6.4 oz (91.8 kg)  01/10/16 191 lb 12.8 oz (87 kg)  11/20/15 192 lb (87.1 kg)    Physical Exam  Constitutional: He is oriented to person, place, and time. He appears well-developed and well-nourished. No distress.  Eyes: Conjunctivae are normal. Right eye exhibits no discharge. Left eye exhibits no discharge. No scleral icterus.  Cardiovascular: Normal rate, regular rhythm, normal heart sounds and intact distal pulses.   No murmur heard. Pulses:      Dorsalis pedis pulses are 2+ on the right side, and 0 on the left side.       Posterior tibial pulses are 2+ on the right side, and 0 on the left side.  Left foot is colder than the right foot and difficulty feeling the pulses.  No skin color changes.  Pulmonary/Chest: Effort normal and breath sounds normal. No respiratory distress. He has no wheezes.  Musculoskeletal: Normal range of motion. He exhibits tenderness (Pain and pinpoint near inguinal canal, no signs of hernia.). He exhibits no edema.  Neurological: He is alert and oriented to person, place, and time. Coordination normal.  Skin: Skin is warm and dry. Lesion (2 skin lesions on left posterior thorax, one appears to be elongated seborrheic keratosis that he says is irritated and  like to have it removed and has been increasing in size. The other one is a little more lateral near his midaxillary line and is a rai) noted. No rash noted. He is not diaphoretic.  Psychiatric: He has a normal mood and affect.  His behavior is normal.  Nursing note and vitals reviewed.  the second nevus is raised with consistent color and no irregular borders. It appears to be a raised nevus.  ABI, right: 1.23 ABI, left: Unable to obtain because unable to obtain pressures on that left side    Assessment & Plan:   Problem List Items Addressed This Visit    None    Visit Diagnoses    PAD (peripheral artery disease) ([place])    -  Primary   Claudication ([place])       Nevus       Large nevus, regular shape but because of size we'll go ahead and removed. Also has a growing SK that we'll remove as well that he is concerned.   Seborrheic keratoses           Follow up plan: Return if symptoms worsen or fail to improve.  Counseling provided for all of the vaccine components No orders of the defined types were placed in this encounter.   Caryl Pina, MD [facility] 07/17/2016, 12:30 PM

## 2016-08-03 DIAGNOSIS — C61 Malignant neoplasm of prostate: Secondary | ICD-10-CM | POA: Diagnosis not present

## 2016-08-03 DIAGNOSIS — N393 Stress incontinence (female) (male): Secondary | ICD-10-CM | POA: Diagnosis not present

## 2016-08-05 ENCOUNTER — Ambulatory Visit (INDEPENDENT_AMBULATORY_CARE_PROVIDER_SITE_OTHER): Payer: Medicare HMO | Admitting: Cardiovascular Disease

## 2016-08-05 ENCOUNTER — Encounter: Payer: Self-pay | Admitting: Cardiovascular Disease

## 2016-08-05 DIAGNOSIS — I739 Peripheral vascular disease, unspecified: Secondary | ICD-10-CM | POA: Diagnosis not present

## 2016-08-05 NOTE — Progress Notes (Signed)
08/05/2016 [name]   [birth year]  [id number]  Primary Physician Fransisca Kaufmann Dettinger, MD Primary Cardiologist: Lorretta Harp MD Renae Gloss  HPI:   [name] is a 66 year old mildly overweight married Caucasian male father of 6, grandfather and 2 grandchildren accompanied by his wife [name] today. He is a patient of Dr. Rosezella [place] . He has a history of 50 pack years tobacco abuse as well as treated hyperlipidemia. He did have a stent placed by Dr. Lia Foyer with LAD June 2013. He had an anterior MI with stent placed in his LAD by Dr. Georgina Peer 09/04/14 with ischemic cardiomyopathy. He also had a stroke January 2011. He had prostatectomy who performed at [facility] July 2017. Afterwards he developed DVT in his left lower extremity and had extensive vascular surgical intervention. Since that time he says his left leg has been cool and painful.   Current Outpatient Prescriptions  Medication Sig Dispense Refill  . albuterol (PROVENTIL HFA;VENTOLIN HFA) 108 (90 Base) MCG/ACT inhaler Inhale 2 puffs into the lungs every 6 (six) hours as needed for wheezing or shortness of breath. 1 Inhaler 0  . aspirin EC 81 MG tablet Take 1 tablet (81 mg total) by mouth daily.    Marland Kitchen BRILINTA 90 MG TABS tablet TAKE 1 TABLET (90 MG TOTAL) BY MOUTH 2 (TWO) TIMES DAILY. 60 tablet 4  . ezetimibe (ZETIA) 10 MG tablet Take 1 tablet (10 mg total) by mouth daily. 30 tablet 5  . isosorbide mononitrate (IMDUR) 30 MG 24 hr tablet Take 1 tablet (30 mg total) by mouth daily. 30 tablet 11  . metoprolol tartrate (LOPRESSOR) 25 MG tablet Take 0.5 tablets (12.5 mg total) by mouth 2 (two) times daily. 30 tablet 5  . ticagrelor (BRILINTA) 90 MG TABS tablet Take 90 mg by mouth 2 (two) times daily.     . nitroGLYCERIN (NITROSTAT) 0.4 MG SL tablet Place 1 tablet (0.4 mg total) under the tongue every 5 (five) minutes as needed for chest pain (up to 3 doses). 25 tablet 5   No current  facility-administered medications for this visit.     Allergies  Allergen Reactions  . Statins Other (See Comments)    Muscle pain    Social History   Social History  . Marital status: Married    Spouse name: cheryl  . Number of children: 3  . Years of education: 12   Occupational History  .      disabled   Social History Main Topics  . Smoking status: Current Some Day Smoker    Packs/day: 1.00    Years: 45.00    Types: Cigarettes    Start date: 10/07/1967  . Smokeless tobacco: Never Used  . Alcohol use No     Comment: rarely  . Drug use: No  . Sexual activity: Yes   Other Topics Concern  . Not on file   Social History Narrative   Patient lives with his wife [name]).   Disabled.   Education high school.   Patient right handed     Review of Systems: General: negative for chills, fever, night sweats or weight changes.  Cardiovascular: negative for chest pain, dyspnea on exertion, edema, orthopnea, palpitations, paroxysmal nocturnal dyspnea or shortness of breath Dermatological: negative for rash Respiratory: negative for cough or wheezing Urologic: negative for hematuria Abdominal: negative for nausea, vomiting, diarrhea, bright red blood per rectum, melena, or hematemesis Neurologic: negative for visual changes, syncope, or dizziness All  other systems reviewed and are otherwise negative except as noted above.    Blood pressure 124/82, pulse 65, height 5\' 9"  (1.753 m), weight 201 lb 3.2 oz (91.3 kg), SpO2 97 %.  General appearance: alert and no distress Neck: no adenopathy, no carotid bruit, no JVD, supple, symmetrical, trachea midline and thyroid not enlarged, symmetric, no tenderness/mass/nodules Lungs: clear to auscultation bilaterally Heart: regular rate and rhythm, S1, S2 normal, no murmur, click, rub or gallop Extremities: Absent left pedal pulse, 2+ femoral pulses without bruits  EKG not performed today  ASSESSMENT AND PLAN:   Peripheral arterial  disease [facility]) Danny. Nicola [name] was referred by his PCP for evaluation of peripheral arterial disease in his left leg. He has a history of aortobifemoral bypass grafting by Dr. Doren Custard October 2010. He had what sounds like left looks every DVT after a prostatectomy at [facility] in July of last year and had extensive vascular intervention at that time. He says that his left leg has felt cold and painful since then. He does have an absent left pedal pulse. I'm going to get lower extremity arterial Doppler studies to further evaluate him prior to seeing him back in follow-up.      Lorretta Harp MD FACP,FACC,FAHA, [facility] 08/05/2016 11:12 AM

## 2016-08-05 NOTE — Assessment & Plan Note (Signed)
[name] was referred by his PCP for evaluation of peripheral arterial disease in his left leg. He has a history of aortobifemoral bypass grafting by Dr. Doren Custard October 2010. He had what sounds like left looks every DVT after a prostatectomy at [facility] in July of last year and had extensive vascular intervention at that time. He says that his left leg has felt cold and painful since then. He does have an absent left pedal pulse. I'm going to get lower extremity arterial Doppler studies to further evaluate him prior to seeing him back in follow-up.

## 2016-08-05 NOTE — Patient Instructions (Signed)
Medication Instructions: Your physician recommends that you continue on your current medications as directed. Please refer to the Current Medication list given to you today.  Testing/Procedures: Your physician has requested that you have a lower extremity arterial duplex. During this test, ultrasound is used to evaluate arterial blood flow in the legs. Allow one hour for this exam. There are no restrictions or special instructions.  Your physician has requested that you have an ankle brachial index (ABI). During this test an ultrasound and blood pressure cuff are used to evaluate the arteries that supply the arms and legs with blood. Allow thirty minutes for this exam. There are no restrictions or special instructions.  Follow-Up: Your physician recommends that you schedule a follow-up appointment after testing.  If you need a refill on your cardiac medications before your next appointment, please call your pharmacy.  

## 2016-08-26 ENCOUNTER — Other Ambulatory Visit: Payer: Self-pay | Admitting: Cardiovascular Disease

## 2016-08-26 DIAGNOSIS — I739 Peripheral vascular disease, unspecified: Secondary | ICD-10-CM

## 2016-08-27 DIAGNOSIS — Z029 Encounter for administrative examinations, unspecified: Secondary | ICD-10-CM

## 2016-09-02 ENCOUNTER — Ambulatory Visit (HOSPITAL_COMMUNITY)
Admission: RE | Admit: 2016-09-02 | Discharge: 2016-09-02 | Disposition: A | Discharge status: Home / Self Care | Payer: Medicare HMO | Source: Ambulatory Visit | Attending: Cardiovascular Disease | Admitting: Cardiovascular Disease

## 2016-09-02 ENCOUNTER — Other Ambulatory Visit: Payer: Self-pay | Admitting: Cardiovascular Disease

## 2016-09-02 DIAGNOSIS — I739 Peripheral vascular disease, unspecified: Secondary | ICD-10-CM | POA: Insufficient documentation

## 2016-09-04 ENCOUNTER — Encounter: Payer: Self-pay | Admitting: Cardiovascular Disease

## 2016-09-04 ENCOUNTER — Ambulatory Visit (INDEPENDENT_AMBULATORY_CARE_PROVIDER_SITE_OTHER): Payer: Medicare HMO | Admitting: Cardiovascular Disease

## 2016-09-04 VITALS — BP 122/82 | HR 58 | Ht 69.0 in | Wt 203.8 lb

## 2016-09-04 DIAGNOSIS — Z01812 Encounter for preprocedural laboratory examination: Secondary | ICD-10-CM | POA: Diagnosis not present

## 2016-09-04 DIAGNOSIS — I2102 ST elevation (STEMI) myocardial infarction involving left anterior descending coronary artery: Secondary | ICD-10-CM

## 2016-09-04 DIAGNOSIS — I739 Peripheral vascular disease, unspecified: Secondary | ICD-10-CM | POA: Diagnosis not present

## 2016-09-04 NOTE — Patient Instructions (Signed)
Dr. Gwenlyn Found has ordered a peripheral angiogram with Dr. Gwenlyn Found on 09/14/16 to be done at [facility].  This procedure is going to look at the bloodflow in your lower extremities.  If Dr. Gwenlyn Found is able to open up the arteries, you will have to spend one night in the hospital.  If he is not able to open the arteries, you will be able to go home that same day.    After the procedure, you will not be allowed to drive for 3 days or push, pull, or lift anything greater than 10 lbs for one week.    You will be required to have the following tests prior to the procedure:  1. Blood work-the blood work can be done no more than 14 days prior to the procedure.  It can be done at any [facility] lab.  There is one downstairs on the first floor of this building and one in the [facility] building [id number])  2. Chest Xray-the chest xray order has already been placed at the [place].     Puncture site Rt Groin

## 2016-09-04 NOTE — Assessment & Plan Note (Signed)
History of peripheral arterial disease status post remote aortobifemoral bypass grafting by Dr. Scot Dock over 8 years ago. He does complain of left lower extremity claudication and also pain at rest. He has had a stroke involving his left side as well in the past. Recent Dopplers performed 09/02/16 revealed a normal right ABI with a left ABI of 0.8 and a high-grade lesion in the mid left SFA with two-vessel runoff. I plan is to perform a peripheral angiogram initially and then the findings anatomy are to making a reclamation regarding revascularization options.

## 2016-09-04 NOTE — Progress Notes (Signed)
09/04/2016 [name]   06/12/1951  [id number]  Primary Physician Fransisca Kaufmann Dettinger, MD Primary Cardiologist: Lorretta Harp MD Renae Gloss  HPI:   [name] is a 66 year old mildly overweight married Caucasian male father of 68, grandfather and 2 grandchildren accompanied by his wife [name] today. He is a patient of Dr. Rosezella [place] . I last saw him in the office 08/05/16. He has a history of 50 pack years tobacco abuse as well as treated hyperlipidemia. He did have a stent placed by Dr. Lia Foyer with LAD June 2013. He had an anterior MI with stent placed in his LAD by Dr. Georgina Peer 09/04/14 with ischemic cardiomyopathy. He also had a stroke January 2011. He had prostatectomy who performed at [facility] July 2017. Afterwards he developed DVT in his left lower extremity and had extensive vascular surgical intervention. Since that time he says his left leg has been cool and painful. He did have aortobifemoral bypass grafting by Dr. Scot Dock over 8 years ago. I'll obtain lower extremity arterial Doppler studies on him 09/02/16 revealing a right ABI 0.99 and a left upper 0. He did have a high-frequency signal in his mid left SFA.   Current Outpatient Prescriptions  Medication Sig Dispense Refill  . albuterol (PROVENTIL HFA;VENTOLIN HFA) 108 (90 Base) MCG/ACT inhaler Inhale 2 puffs into the lungs every 6 (six) hours as needed for wheezing or shortness of breath. 1 Inhaler 0  . aspirin EC 81 MG tablet Take 1 tablet (81 mg total) by mouth daily.    Marland Kitchen BRILINTA 90 MG TABS tablet TAKE 1 TABLET (90 MG TOTAL) BY MOUTH 2 (TWO) TIMES DAILY. 60 tablet 4  . ezetimibe (ZETIA) 10 MG tablet Take 1 tablet (10 mg total) by mouth daily. 30 tablet 5  . isosorbide mononitrate (IMDUR) 30 MG 24 hr tablet Take 1 tablet (30 mg total) by mouth daily. 30 tablet 11  . metoprolol tartrate (LOPRESSOR) 25 MG tablet Take 0.5 tablets (12.5 mg total) by mouth 2 (two) times daily. 30 tablet 5  .  ticagrelor (BRILINTA) 90 MG TABS tablet Take 90 mg by mouth 2 (two) times daily.     . nitroGLYCERIN (NITROSTAT) 0.4 MG SL tablet Place 1 tablet (0.4 mg total) under the tongue every 5 (five) minutes as needed for chest pain (up to 3 doses). 25 tablet 5   No current facility-administered medications for this visit.     Allergies  Allergen Reactions  . Statins Other (See Comments)    Muscle pain    Social History   Social History  . Marital status: Married    Spouse name: cheryl  . Number of children: 3  . Years of education: 12   Occupational History  .      disabled   Social History Main Topics  . Smoking status: Current Some Day Smoker    Packs/day: 1.00    Years: 45.00    Types: Cigarettes    Start date: 10/07/1967  . Smokeless tobacco: Never Used  . Alcohol use No     Comment: rarely  . Drug use: No  . Sexual activity: Yes   Other Topics Concern  . Not on file   Social History Narrative   Patient lives with his wife [name]).   Disabled.   Education high school.   Patient right handed     Review of Systems: General: negative for chills, fever, night sweats or weight changes.  Cardiovascular: negative for  chest pain, dyspnea on exertion, edema, orthopnea, palpitations, paroxysmal nocturnal dyspnea or shortness of breath Dermatological: negative for rash Respiratory: negative for cough or wheezing Urologic: negative for hematuria Abdominal: negative for nausea, vomiting, diarrhea, bright red blood per rectum, melena, or hematemesis Neurologic: negative for visual changes, syncope, or dizziness All other systems reviewed and are otherwise negative except as noted above.    Blood pressure 122/82, pulse (!) 58, height 5\' 9"  (1.753 m), weight 203 lb 12.8 oz (92.4 kg).  General appearance: alert and no distress Neck: no adenopathy, no carotid bruit, no JVD, supple, symmetrical, trachea midline and thyroid not enlarged, symmetric, no  tenderness/mass/nodules Lungs: clear to auscultation bilaterally Heart: regular rate and rhythm, S1, S2 normal, no murmur, click, rub or gallop Extremities: extremities normal, atraumatic, no cyanosis or edema  EKG sinus bradycardia 58 with incomplete right bundle branch block. I personally reviewed this EKG.  ASSESSMENT AND PLAN:   Peripheral arterial disease ([place]) History of peripheral arterial disease status post remote aortobifemoral bypass grafting by Dr. Scot Dock over 8 years ago. He does complain of left lower extremity claudication and also pain at rest. He has had a stroke involving his left side as well in the past. Recent Dopplers performed 09/02/16 revealed a normal right ABI with a left ABI of 0.8 and a high-grade lesion in the mid left SFA with two-vessel runoff. I plan is to perform a peripheral angiogram initially and then the findings anatomy are to making a reclamation regarding revascularization options.      Lorretta Harp MD FACP,FACC,FAHA, [facility] 09/04/2016 11:19 AM

## 2016-09-07 ENCOUNTER — Other Ambulatory Visit: Payer: Self-pay | Admitting: Cardiovascular Disease

## 2016-09-07 DIAGNOSIS — I739 Peripheral vascular disease, unspecified: Secondary | ICD-10-CM

## 2016-09-08 ENCOUNTER — Ambulatory Visit
Admission: RE | Admit: 2016-09-08 | Discharge: 2016-09-08 | Disposition: A | Discharge status: Home / Self Care | Payer: Medicare HMO | Source: Ambulatory Visit | Attending: Cardiovascular Disease | Admitting: Cardiovascular Disease

## 2016-09-08 DIAGNOSIS — J449 Chronic obstructive pulmonary disease, unspecified: Secondary | ICD-10-CM | POA: Diagnosis not present

## 2016-09-08 DIAGNOSIS — Z01812 Encounter for preprocedural laboratory examination: Secondary | ICD-10-CM | POA: Diagnosis not present

## 2016-09-08 DIAGNOSIS — I739 Peripheral vascular disease, unspecified: Secondary | ICD-10-CM

## 2016-09-08 DIAGNOSIS — I2102 ST elevation (STEMI) myocardial infarction involving left anterior descending coronary artery: Secondary | ICD-10-CM

## 2016-09-08 LAB — CBC WITH DIFFERENTIAL/PLATELET
BASOS PCT: 1 %
Basophils Absolute: 70 cells/uL (ref 0–200)
EOS PCT: 2 %
Eosinophils Absolute: 140 cells/uL (ref 15–500)
HCT: 47.5 % (ref 38.5–50.0)
HEMOGLOBIN: 16 g/dL (ref 13.2–17.1)
LYMPHS ABS: 1890 {cells}/uL (ref 850–3900)
Lymphocytes Relative: 27 %
MCH: 31.1 pg (ref 27.0–33.0)
MCHC: 33.7 g/dL (ref 32.0–36.0)
MCV: 92.4 fL (ref 80.0–100.0)
MONOS PCT: 8 %
MPV: 11.5 fL (ref 7.5–12.5)
Monocytes Absolute: 560 cells/uL (ref 200–950)
NEUTROS ABS: 4340 {cells}/uL (ref 1500–7800)
Neutrophils Relative %: 62 %
PLATELETS: 215 10*3/uL (ref 140–400)
RBC: 5.14 MIL/uL (ref 4.20–5.80)
RDW: 14 % (ref 11.0–15.0)
WBC: 7 10*3/uL (ref 3.8–10.8)

## 2016-09-08 LAB — APTT: aPTT: 27 s (ref 22–34)

## 2016-09-08 LAB — PROTIME-INR
INR: 1
PROTHROMBIN TIME: 11 s (ref 9.0–11.5)

## 2016-09-08 LAB — TSH: TSH: 2.3 mIU/L (ref 0.40–4.50)

## 2016-09-09 LAB — BASIC METABOLIC PANEL WITH GFR
BUN: 13 mg/dL (ref 7–25)
CALCIUM: 9.2 mg/dL (ref 8.6–10.3)
CHLORIDE: 107 mmol/L (ref 98–110)
CO2: 24 mmol/L (ref 20–31)
CREATININE: 1.06 mg/dL (ref 0.70–1.25)
GFR, EST AFRICAN AMERICAN: 85 mL/min (ref >=60)
GFR, Est Non African American: 74 mL/min (ref >=60)
Glucose, Bld: 85 mg/dL (ref 65–99)
Potassium: 4.3 mmol/L (ref 3.5–5.3)
SODIUM: 140 mmol/L (ref 135–146)

## 2016-09-11 ENCOUNTER — Other Ambulatory Visit: Payer: Self-pay | Admitting: Cardiology

## 2016-09-11 NOTE — Telephone Encounter (Signed)
Rx(s) sent to pharmacy electronically.  

## 2016-09-14 ENCOUNTER — Telehealth: Payer: Self-pay

## 2016-09-14 ENCOUNTER — Encounter (HOSPITAL_COMMUNITY): Payer: Self-pay | Admitting: Cardiovascular Disease

## 2016-09-14 ENCOUNTER — Encounter (HOSPITAL_COMMUNITY)
Admission: RE | Disposition: A | Discharge status: Home / Self Care | Payer: Self-pay | Source: Ambulatory Visit | Attending: Cardiovascular Disease

## 2016-09-14 ENCOUNTER — Ambulatory Visit (HOSPITAL_COMMUNITY)
Admission: RE | Admit: 2016-09-14 | Discharge: 2016-09-14 | Disposition: A | Discharge status: Home / Self Care | Payer: Medicare HMO | Source: Ambulatory Visit | Attending: Cardiovascular Disease | Admitting: Cardiovascular Disease

## 2016-09-14 DIAGNOSIS — I739 Peripheral vascular disease, unspecified: Secondary | ICD-10-CM

## 2016-09-14 DIAGNOSIS — I70213 Atherosclerosis of native arteries of extremities with intermittent claudication, bilateral legs: Secondary | ICD-10-CM | POA: Diagnosis not present

## 2016-09-14 DIAGNOSIS — I252 Old myocardial infarction: Secondary | ICD-10-CM | POA: Diagnosis not present

## 2016-09-14 DIAGNOSIS — F17210 Nicotine dependence, cigarettes, uncomplicated: Secondary | ICD-10-CM | POA: Diagnosis not present

## 2016-09-14 DIAGNOSIS — Z86718 Personal history of other venous thrombosis and embolism: Secondary | ICD-10-CM | POA: Insufficient documentation

## 2016-09-14 DIAGNOSIS — Z955 Presence of coronary angioplasty implant and graft: Secondary | ICD-10-CM | POA: Diagnosis not present

## 2016-09-14 DIAGNOSIS — E785 Hyperlipidemia, unspecified: Secondary | ICD-10-CM | POA: Insufficient documentation

## 2016-09-14 DIAGNOSIS — E663 Overweight: Secondary | ICD-10-CM | POA: Diagnosis not present

## 2016-09-14 DIAGNOSIS — Z8673 Personal history of transient ischemic attack (TIA), and cerebral infarction without residual deficits: Secondary | ICD-10-CM | POA: Diagnosis not present

## 2016-09-14 DIAGNOSIS — Z7982 Long term (current) use of aspirin: Secondary | ICD-10-CM | POA: Insufficient documentation

## 2016-09-14 DIAGNOSIS — I255 Ischemic cardiomyopathy: Secondary | ICD-10-CM | POA: Insufficient documentation

## 2016-09-14 DIAGNOSIS — Z6829 Body mass index (BMI) 29.0-29.9, adult: Secondary | ICD-10-CM | POA: Insufficient documentation

## 2016-09-14 DIAGNOSIS — I70223 Atherosclerosis of native arteries of extremities with rest pain, bilateral legs: Secondary | ICD-10-CM | POA: Insufficient documentation

## 2016-09-14 HISTORY — PX: LOWER EXTREMITY ANGIOGRAPHY: CATH118251

## 2016-09-14 SURGERY — LOWER EXTREMITY ANGIOGRAPHY
Laterality: Left

## 2016-09-14 MED ORDER — IODIXANOL 320 MG/ML IV SOLN
INTRAVENOUS | Status: DC | PRN
  Administered 2016-09-14: 136 mL via INTRA_ARTERIAL

## 2016-09-14 MED ORDER — HEPARIN (PORCINE) IN NACL 2-0.9 UNIT/ML-% IJ SOLN
INTRAMUSCULAR | Status: DC | PRN
  Administered 2016-09-14: 1000 mL via INTRA_ARTERIAL

## 2016-09-14 MED ORDER — ACETAMINOPHEN 325 MG PO TABS: 650.0000 mg | ORAL_TABLET | ORAL | Status: DC | PRN

## 2016-09-14 MED ORDER — HYDRALAZINE HCL 20 MG/ML IJ SOLN: 10.0000 mg | INTRAMUSCULAR | Status: DC | PRN

## 2016-09-14 MED ORDER — LIDOCAINE HCL (PF) 1 % IJ SOLN
INTRAMUSCULAR | Status: AC
  Filled 2016-09-14: qty 30

## 2016-09-14 MED ORDER — MORPHINE SULFATE (PF) 4 MG/ML IV SOLN: 2.0000 mg | INTRAVENOUS | Status: DC | PRN

## 2016-09-14 MED ORDER — SODIUM CHLORIDE 0.9 % IV SOLN: INTRAVENOUS | Status: DC

## 2016-09-14 MED ORDER — SODIUM CHLORIDE 0.9 % WEIGHT BASED INFUSION: 1.0000 mL/kg/h | INTRAVENOUS | Status: DC

## 2016-09-14 MED ORDER — FENTANYL CITRATE (PF) 100 MCG/2ML IJ SOLN
INTRAMUSCULAR | Status: DC | PRN
  Administered 2016-09-14: 50 ug via INTRAVENOUS

## 2016-09-14 MED ORDER — FENTANYL CITRATE (PF) 100 MCG/2ML IJ SOLN
INTRAMUSCULAR | Status: AC
  Filled 2016-09-14: qty 2

## 2016-09-14 MED ORDER — ASPIRIN 81 MG PO CHEW: 81.0000 mg | CHEWABLE_TABLET | ORAL | Status: DC

## 2016-09-14 MED ORDER — SODIUM CHLORIDE 0.9% FLUSH: 3.0000 mL | INTRAVENOUS | Status: DC | PRN

## 2016-09-14 MED ORDER — ONDANSETRON HCL 4 MG/2ML IJ SOLN: 4.0000 mg | Freq: Four times a day (QID) | INTRAMUSCULAR | Status: DC | PRN

## 2016-09-14 MED ORDER — HEPARIN (PORCINE) IN NACL 2-0.9 UNIT/ML-% IJ SOLN
INTRAMUSCULAR | Status: AC
  Filled 2016-09-14: qty 1000

## 2016-09-14 MED ORDER — SODIUM CHLORIDE 0.9 % WEIGHT BASED INFUSION
3.0000 mL/kg/h | INTRAVENOUS | Status: AC
  Administered 2016-09-14: 3 mL/kg/h via INTRAVENOUS

## 2016-09-14 MED ORDER — LIDOCAINE HCL (PF) 1 % IJ SOLN
INTRAMUSCULAR | Status: DC | PRN
  Administered 2016-09-14: 17 mL via SUBCUTANEOUS

## 2016-09-14 SURGICAL SUPPLY — 9 items
CATH ANGIO 5F PIGTAIL 65CM (CATHETERS) ×4 IMPLANT
KIT PV (KITS) ×2 IMPLANT
SHEATH PINNACLE 5F 10CM (SHEATH) ×2 IMPLANT
STOPCOCK MORSE 400PSI 3WAY (MISCELLANEOUS) ×2 IMPLANT
SYR MEDRAD MARK V 150ML (SYRINGE) ×2 IMPLANT
TRANSDUCER W/STOPCOCK (MISCELLANEOUS) ×2 IMPLANT
TRAY PV CATH (CUSTOM PROCEDURE TRAY) ×2 IMPLANT
TUBING CIL FLEX 10 FLL-RA (TUBING) ×2 IMPLANT
WIRE HITORQ VERSACORE ST 145CM (WIRE) ×2 IMPLANT

## 2016-09-14 NOTE — H&P (View-Only) (Signed)
09/04/2016 [name]   Apr 20, 1951  [id number]  Primary Physician Fransisca Kaufmann Dettinger, MD Primary Cardiologist: Lorretta Harp MD Renae Gloss  HPI:   [name] is a 66 year old mildly overweight married Caucasian male father of 35, grandfather and 2 grandchildren accompanied by his wife [name] today. He is a patient of Dr. Rosezella [place] . I last saw him in the office 08/05/16. He has a history of 50 pack years tobacco abuse as well as treated hyperlipidemia. He did have a stent placed by Dr. Lia Foyer with LAD June 2013. He had an anterior MI with stent placed in his LAD by Dr. Georgina Peer 09/04/14 with ischemic cardiomyopathy. He also had a stroke January 2011. He had prostatectomy who performed at [facility] July 2017. Afterwards he developed DVT in his left lower extremity and had extensive vascular surgical intervention. Since that time he says his left leg has been cool and painful. He did have aortobifemoral bypass grafting by Dr. Scot Dock over 8 years ago. I'll obtain lower extremity arterial Doppler studies on him 09/02/16 revealing a right ABI 0.99 and a left upper 0. He did have a high-frequency signal in his mid left SFA.   Current Outpatient Prescriptions  Medication Sig Dispense Refill  . albuterol (PROVENTIL HFA;VENTOLIN HFA) 108 (90 Base) MCG/ACT inhaler Inhale 2 puffs into the lungs every 6 (six) hours as needed for wheezing or shortness of breath. 1 Inhaler 0  . aspirin EC 81 MG tablet Take 1 tablet (81 mg total) by mouth daily.    Marland Kitchen BRILINTA 90 MG TABS tablet TAKE 1 TABLET (90 MG TOTAL) BY MOUTH 2 (TWO) TIMES DAILY. 60 tablet 4  . ezetimibe (ZETIA) 10 MG tablet Take 1 tablet (10 mg total) by mouth daily. 30 tablet 5  . isosorbide mononitrate (IMDUR) 30 MG 24 hr tablet Take 1 tablet (30 mg total) by mouth daily. 30 tablet 11  . metoprolol tartrate (LOPRESSOR) 25 MG tablet Take 0.5 tablets (12.5 mg total) by mouth 2 (two) times daily. 30 tablet 5  .  ticagrelor (BRILINTA) 90 MG TABS tablet Take 90 mg by mouth 2 (two) times daily.     . nitroGLYCERIN (NITROSTAT) 0.4 MG SL tablet Place 1 tablet (0.4 mg total) under the tongue every 5 (five) minutes as needed for chest pain (up to 3 doses). 25 tablet 5   No current facility-administered medications for this visit.     Allergies  Allergen Reactions  . Statins Other (See Comments)    Muscle pain    Social History   Social History  . Marital status: Married    Spouse name: cheryl  . Number of children: 3  . Years of education: 12   Occupational History  .      disabled   Social History Main Topics  . Smoking status: Current Some Day Smoker    Packs/day: 1.00    Years: 45.00    Types: Cigarettes    Start date: 10/07/1967  . Smokeless tobacco: Never Used  . Alcohol use No     Comment: rarely  . Drug use: No  . Sexual activity: Yes   Other Topics Concern  . Not on file   Social History Narrative   Patient lives with his wife [name]).   Disabled.   Education high school.   Patient right handed     Review of Systems: General: negative for chills, fever, night sweats or weight changes.  Cardiovascular: negative for  chest pain, dyspnea on exertion, edema, orthopnea, palpitations, paroxysmal nocturnal dyspnea or shortness of breath Dermatological: negative for rash Respiratory: negative for cough or wheezing Urologic: negative for hematuria Abdominal: negative for nausea, vomiting, diarrhea, bright red blood per rectum, melena, or hematemesis Neurologic: negative for visual changes, syncope, or dizziness All other systems reviewed and are otherwise negative except as noted above.    Blood pressure 122/82, pulse (!) 58, height 5\' 9"  (1.753 m), weight 203 lb 12.8 oz (92.4 kg).  General appearance: alert and no distress Neck: no adenopathy, no carotid bruit, no JVD, supple, symmetrical, trachea midline and thyroid not enlarged, symmetric, no  tenderness/mass/nodules Lungs: clear to auscultation bilaterally Heart: regular rate and rhythm, S1, S2 normal, no murmur, click, rub or gallop Extremities: extremities normal, atraumatic, no cyanosis or edema  EKG sinus bradycardia 58 with incomplete right bundle branch block. I personally reviewed this EKG.  ASSESSMENT AND PLAN:   Peripheral arterial disease ([place]) History of peripheral arterial disease status post remote aortobifemoral bypass grafting by Dr. Scot Dock over 8 years ago. He does complain of left lower extremity claudication and also pain at rest. He has had a stroke involving his left side as well in the past. Recent Dopplers performed 09/02/16 revealed a normal right ABI with a left ABI of 0.8 and a high-grade lesion in the mid left SFA with two-vessel runoff. I plan is to perform a peripheral angiogram initially and then the findings anatomy are to making a reclamation regarding revascularization options.      Lorretta Harp MD FACP,FACC,FAHA, [facility] 09/04/2016 11:19 AM

## 2016-09-14 NOTE — Interval H&P Note (Signed)
History and Physical Interval Note:  09/14/2016 8:56 AM  [name]  has presented today for surgery, with the diagnosis of claudication  The various methods of treatment have been discussed with the patient and family. After consideration of risks, benefits and other options for treatment, the patient has consented to  Procedure(s): Lower Extremity Angiography (Left) as a surgical intervention .  The patient's history has been reviewed, patient examined, no change in status, stable for surgery.  I have reviewed the patient's chart and labs.  Questions were answered to the patient's satisfaction.     Quay Burow

## 2016-09-14 NOTE — Discharge Instructions (Signed)
Femoral Site Care °Refer to this sheet in the next few weeks. These instructions provide you with information about caring for yourself after your procedure. Your health care provider may also give you more specific instructions. Your treatment has been planned according to current medical practices, but problems sometimes occur. Call your health care provider if you have any problems or questions after your procedure. °What can I expect after the procedure? °After your procedure, it is typical to have the following: °· Bruising at the site that usually fades within 1-2 weeks. °· Blood collecting in the tissue (hematoma) that may be painful to the touch. It should usually decrease in size and tenderness within 1-2 weeks. °Follow these instructions at home: °· Take medicines only as directed by your health care provider. °· You may shower 24-48 hours after the procedure or as directed by your health care provider. Remove the bandage (dressing) and gently wash the site with plain soap and water. Pat the area dry with a clean towel. Do not rub the site, because this may cause bleeding. °· Do not take baths, swim, or use a hot tub until your health care provider approves. °· Check your insertion site every day for redness, swelling, or drainage. °· Do not apply powder or lotion to the site. °· Limit use of stairs to twice a day for the first 2-3 days or as directed by your health care provider. °· Do not squat for the first 2-3 days or as directed by your health care provider. °· Do not lift over 10 lb (4.5 kg) for 5 days after your procedure or as directed by your health care provider. °· Ask your health care provider when it is okay to: °¨ Return to work or school. °¨ Resume usual physical activities or sports. °¨ Resume sexual activity. °· Do not drive home if you are discharged the same day as the procedure. Have someone else drive you. °· You may drive 24 hours after the procedure unless otherwise instructed by  your health care provider. °· Do not operate machinery or power tools for 24 hours after the procedure or as directed by your health care provider. °· If your procedure was done as an outpatient procedure, which means that you went home the same day as your procedure, a responsible adult should be with you for the first 24 hours after you arrive home. °· Keep all follow-up visits as directed by your health care provider. This is important. °Contact a health care provider if: °· You have a fever. °· You have chills. °· You have increased bleeding from the site. Hold pressure on the site. °Get help right away if: °· You have unusual pain at the site. °· You have redness, warmth, or swelling at the site. °· You have drainage (other than a small amount of blood on the dressing) from the site. °· The site is bleeding, and the bleeding does not stop after 30 minutes of holding steady pressure on the site. °· Your leg or foot becomes pale, cool, tingly, or numb. °This information is not intended to replace advice given to you by your health care provider. Make sure you discuss any questions you have with your health care provider. °Document Released: 02/16/2014 Document Revised: 11/21/2015 Document Reviewed: 01/02/2014 °Elsevier Interactive Patient Education © 2017 [employer] ° °

## 2016-09-15 ENCOUNTER — Telehealth: Payer: Self-pay | Admitting: Cardiovascular Disease

## 2016-09-15 NOTE — Telephone Encounter (Signed)
Spoke with pt he states that when he got home from procedure yesterday he developed a rash "all over" Pt denies any other symptoms, nausea, vomiting, chest pain or pressure, shortness of breath, edema, no lightheaded or dizziness, instructed pt to take benadryl for rash all over. Please benadryl SIG and any other suggestions

## 2016-09-15 NOTE — Telephone Encounter (Signed)
Pt notified he will call back if any new sx develop to call back or go to the er

## 2016-09-15 NOTE — Telephone Encounter (Signed)
No additional recommendations. Continue proper hydration and take benadryl as recommended.

## 2016-09-15 NOTE — Telephone Encounter (Signed)
Patient calling, because he had a cath procedure on 09-14-16 and states that when he got home had developed a full-body rash. Please call to discuss, thanks.

## 2016-09-16 ENCOUNTER — Encounter: Payer: Self-pay | Admitting: Vascular Surgery

## 2016-09-16 ENCOUNTER — Ambulatory Visit (INDEPENDENT_AMBULATORY_CARE_PROVIDER_SITE_OTHER): Payer: Medicare HMO | Admitting: Vascular Surgery

## 2016-09-16 VITALS — BP 117/73 | HR 61 | Temp 97.1°F | Resp 18 | Ht 69.0 in | Wt 207.1 lb

## 2016-09-16 DIAGNOSIS — I739 Peripheral vascular disease, unspecified: Secondary | ICD-10-CM

## 2016-09-16 DIAGNOSIS — I743 Embolism and thrombosis of arteries of the lower extremities: Secondary | ICD-10-CM

## 2016-09-16 NOTE — Progress Notes (Signed)
Patient name: [name] MRN: [id number] DOB: [birth year] Sex: male  REASON FOR VISIT: Peripheral vascular disease.  HPI: [name] is a 66 y.o. male who presents for evaluation of peripheral vascular disease. I did an aortobifemoral bypass graft on him in October 2010. This was with a 14 x 7 mm Dacron graft. The proximal anastomosis was end to side. I saw him in follow up in December 2010 and at that time he had palpable pedal pulses. He was to come back in 6 months was then lost to follow up. His postop ABIs were 100% bilaterally with triphasic Doppler signals in both feet.  He underwent a prostatectomy and [facility]. In listening to his history, it sounds like he occluded the left limb of his aortofemoral bypass graft postoperatively possibly from being up in stirrups during the case. He required thrombectomy and fasciotomy with placement of a negative pressure dressing on his fasciotomy sites. He has continued to have left leg claudication. He has no symptoms on the right side. He has no history of rest pain or nonhealing ulcers.  This smokes 15 cigarettes a day. He had smoked up to one and a half packs per day for many years.  Past Medical History:  Diagnosis Date  . CAD (coronary artery disease) 12/2008   a. cath 11/2011 s/p DES to LAD b. anterior STEMI 09/04/2014 cath DES to occluded prox LAD, 40% AV groove LCx and 30% mid RCA  . Cancer [name]) 06/2012   prostate, larynx  . CHF (congestive heart failure) ([place])   . COPD (chronic obstructive pulmonary disease) ([place])   . CVA (cerebral infarction) 06/2009  . Diabetes mellitus without complication ([place])   . Elevated PSA 02/2012   6.54  . Hyperlipidemia    intolerant to statins  . Myocardial infarction   . PVD (peripheral vascular disease) ([place])    Aortobifemoral Bypass 2010, Thrombectomy and repair right brachial artery   . Radiation 08/10/2012-09/22/2012   6525 cGy to larynx  . Stroke ([place])   . Tobacco abuse   . Unstable angina  ([facility])     Family History  Problem Relation Age of Onset  . Coronary artery disease Father 67  . Heart disease Father   . Coronary artery disease Brother 46    died with AMI  . Heart disease Brother   . Breast cancer Mother     SOCIAL HISTORY: Social History  Substance Use Topics  . Smoking status: Current Some Day Smoker    Packs/day: 1.00    Years: 45.00    Types: Cigarettes    Start date: 10/07/1967  . Smokeless tobacco: Never Used  . Alcohol use No     Comment: rarely    Allergies  Allergen Reactions  . Statins Other (See Comments)    Muscle pain    Current Outpatient Prescriptions  Medication Sig Dispense Refill  . albuterol (PROVENTIL HFA;VENTOLIN HFA) 108 (90 Base) MCG/ACT inhaler Inhale 2 puffs into the lungs every 6 (six) hours as needed for wheezing or shortness of breath. 1 Inhaler 0  . aspirin EC 81 MG tablet Take 1 tablet (81 mg total) by mouth daily.    Marland Kitchen BRILINTA 90 MG TABS tablet TAKE 1 TABLET (90 MG TOTAL) BY MOUTH 2 (TWO) TIMES DAILY. 60 tablet 8  . ezetimibe (ZETIA) 10 MG tablet Take 1 tablet (10 mg total) by mouth daily. 30 tablet 5  . fluocinonide cream (LIDEX) 2.42 % Apply 1 application topically 2 (two) times daily as  needed.    . isosorbide mononitrate (IMDUR) 30 MG 24 hr tablet Take 1 tablet (30 mg total) by mouth daily. 30 tablet 11  . metoprolol tartrate (LOPRESSOR) 25 MG tablet Take 0.5 tablets (12.5 mg total) by mouth 2 (two) times daily. 30 tablet 5  . OVER THE COUNTER MEDICATION Take 1,200 mg by mouth every morning. EDTA 600 mg per cap   (Ethylenediaminetetraacetic acid)    . ticagrelor (BRILINTA) 90 MG TABS tablet Take 90 mg by mouth 2 (two) times daily.     . nitroGLYCERIN (NITROSTAT) 0.4 MG SL tablet Place 1 tablet (0.4 mg total) under the tongue every 5 (five) minutes as needed for chest pain (up to 3 doses). 25 tablet 5   No current facility-administered medications for this visit.     REVIEW OF SYSTEMS:  [X]  denotes positive finding,  [ ]  denotes negative finding Cardiac  Comments:  Chest pain or chest pressure:    Shortness of breath upon exertion:    Short of breath when lying flat:    Irregular heart rhythm:        Vascular    Pain in calf, thigh, or hip brought on by ambulation: X   Pain in feet at night that wakes you up from your sleep:     Blood clot in your veins:    Leg swelling:         Pulmonary    Oxygen at home:    Productive cough:     Wheezing:         Neurologic    Sudden weakness in arms or legs:     Sudden numbness in arms or legs:     Sudden onset of difficulty speaking or slurred speech:    Temporary loss of vision in one eye:     Problems with dizziness:         Gastrointestinal    Blood in stool:     Vomited blood:         Genitourinary    Burning when urinating:     Blood in urine:        Psychiatric    Major depression:         Hematologic    Bleeding problems:    Problems with blood clotting too easily:        Skin    Rashes or ulcers:        Constitutional    Fever or chills:      PHYSICAL EXAM: Vitals:   09/16/16 1245  BP: 117/73  Pulse: 61  Resp: 18  Temp: 97.1 F (36.2 C)  TempSrc: Oral  SpO2: 96%  Weight: 207 lb 1.6 oz (93.9 kg)  Height: 5\' 9"  (1.753 m)    GENERAL: The patient is a well-nourished male, in no acute distress. The vital signs are documented above. CARDIAC: There is a regular rate and rhythm.  VASCULAR: I do not detect carotid bruits. On the left side, which is the symptomatic side, he has a palpable femoral pulse. I cannot palpate a popliteal or pedal pulses. On the right side, he has a palpable femoral, popliteal, and dorsalis pedis pulse. He has mild left lower extremity swelling. PULMONARY: There is good air exchange bilaterally without wheezing or rales. ABDOMEN: Soft and non-tender with normal pitched bowel sounds.  MUSCULOSKELETAL: There are no major deformities or cyanosis. NEUROLOGIC: No focal weakness or paresthesias are  detected. SKIN: There are no ulcers or rashes noted. PSYCHIATRIC: The patient has  a normal affect.  DATA:   ARTERIOGRAM: I have reviewed his arteriogram that was done by Dr. Quay Burow on 09/16/2016. His aortobifemoral bypass graft is widely patent. On the left side he has a patent superficial femoral artery with some focal areas of stenosis. The above-knee popliteal artery is patent and there is two-vessel runoff on the left via the anterior tibial and posterior tibial arteries.  MEDICAL ISSUES:  LEFT SUPERFICIAL FEMORAL ARTERY OCCLUSIVE DISEASE: He was not a candidate for an endovascular approach to his left superficial femoral artery occlusive disease given that he has an aortobifemoral bypass graft. Thus he would require a left femoropopliteal bypass graft to address this disease. The main risk associated with this is the risk of infection. He's now had 2 operations in the left groin most recently in July 2017 after his emergent surgery. This puts him at increased risk for infection. We have discussed the importance of tobacco cessation. I've also discussed the importance of getting on a structure walking program. Hopefully his symptoms will continue to gradually improve with this we can avoid revascularization. If his symptoms progress that he could be considered for a left femoral to above-knee popliteal artery bypass, hopefully with a vein graft. I've ordered follow up ABIs and a right great saphenous vein mapped in 6 months and I'll see him back at that time. He knows to call sooner if he has problems.    Deitra Mayo [facility] [phone]

## 2016-09-16 NOTE — Telephone Encounter (Signed)
If he has any further problems with his leg , he may need a Psychologist, sport and exercise. Does not need a referral for now.

## 2016-09-16 NOTE — Telephone Encounter (Signed)
I do not understand what he needs a referral for, he is seen cardiovascular and he is are seen urology, those of the major issues that I saw him for in our last visit. Unless he is talking that he wants referral to dermatology.

## 2016-09-17 NOTE — Addendum Note (Signed)
Addended by: Lianne Cure A on: 09/17/2016 02:05 PM   Modules accepted: Orders

## 2016-10-23 ENCOUNTER — Ambulatory Visit (INDEPENDENT_AMBULATORY_CARE_PROVIDER_SITE_OTHER): Payer: Medicare HMO | Admitting: Cardiovascular Disease

## 2016-10-23 ENCOUNTER — Encounter: Payer: Self-pay | Admitting: Cardiovascular Disease

## 2016-10-23 DIAGNOSIS — I739 Peripheral vascular disease, unspecified: Secondary | ICD-10-CM | POA: Diagnosis not present

## 2016-10-23 NOTE — Progress Notes (Signed)
10/23/2016 [name]   01/29/1951  [id number]  Primary Physician Fransisca Kaufmann Dettinger, MD Primary Cardiologist: Lorretta Harp MD Lupe Carney, [place]  HPI:  [name] is a 66 year old mildly overweight married Caucasian male father of 55, grandfather and 2 grandchildren who I last saw in the office 09/04/16. He is a patient of Dr. Rosezella Florida . I last saw him in the office 08/05/16.He has a history of 50 pack years tobacco abuse as well as treated hyperlipidemia. He did have a stent placed by Dr. Lia Foyer with LAD June 2013. He had an anterior MI with stent placed in his LAD by Dr. Claiborne Billings 09/04/14 with ischemic cardiomyopathy. He also had a stroke January 2011. He had prostatectomy who performed at [facility] July 2017. Afterwards he developed DVT in his left lower extremity and had extensive vascular surgical intervention. Since that time he says his left leg has been cool and painful. He did have aortobifemoral bypass grafting by Dr. Scot Dock over 8 years ago. I'll obtain lower extremity arterial Doppler studies on him 09/02/16 revealing a right ABI 0.99 and a left upper 0. He did have a high-frequency signal in his mid left SFA. He presents underwent peripheral angiography by myself 09/14/16 revealing an intact aortobifemoral bypass graft with diffuse 7080% proximal, mid and distal left SFA stenosis. He had 2 vessel runoff with an occluded peroneal. I do not think he had percutaneous options for revascularization. He was saw Dr. Scot Dock back in the office. He was not currently a surgical revascularization candidate as well. He does continue to smoke but is trying to cut back.  Current Outpatient Prescriptions  Medication Sig Dispense Refill  . albuterol (PROVENTIL HFA;VENTOLIN HFA) 108 (90 Base) MCG/ACT inhaler Inhale 2 puffs into the lungs every 6 (six) hours as needed for wheezing or shortness of breath. 1 Inhaler 0  . aspirin EC 81 MG tablet Take 1 tablet (81 mg total)  by mouth daily.    Marland Kitchen BRILINTA 90 MG TABS tablet TAKE 1 TABLET (90 MG TOTAL) BY MOUTH 2 (TWO) TIMES DAILY. 60 tablet 8  . ezetimibe (ZETIA) 10 MG tablet Take 1 tablet (10 mg total) by mouth daily. 30 tablet 5  . fluocinonide cream (LIDEX) 4.65 % Apply 1 application topically 2 (two) times daily as needed.    . isosorbide mononitrate (IMDUR) 30 MG 24 hr tablet Take 1 tablet (30 mg total) by mouth daily. 30 tablet 11  . metoprolol tartrate (LOPRESSOR) 25 MG tablet Take 0.5 tablets (12.5 mg total) by mouth 2 (two) times daily. 30 tablet 5  . OVER THE COUNTER MEDICATION Take 1,200 mg by mouth every morning. EDTA 600 mg per cap   (Ethylenediaminetetraacetic acid)    . ticagrelor (BRILINTA) 90 MG TABS tablet Take 90 mg by mouth 2 (two) times daily.     . nitroGLYCERIN (NITROSTAT) 0.4 MG SL tablet Place 1 tablet (0.4 mg total) under the tongue every 5 (five) minutes as needed for chest pain (up to 3 doses). 25 tablet 5   No current facility-administered medications for this visit.     Allergies  Allergen Reactions  . Statins Other (See Comments)    Muscle pain    Social History   Social History  . Marital status: Married    Spouse name: cheryl  . Number of children: 3  . Years of education: 12   Occupational History  .      disabled   Social History  Main Topics  . Smoking status: Current Every Day Smoker    Packs/day: 1.00    Years: 45.00    Types: Cigarettes    Start date: 10/07/1967  . Smokeless tobacco: Never Used  . Alcohol use No     Comment: rarely  . Drug use: No  . Sexual activity: Yes   Other Topics Concern  . Not on file   Social History Narrative   Patient lives with his wife [name]).   Disabled.   Education high school.   Patient right handed     Review of Systems: General: negative for chills, fever, night sweats or weight changes.  Cardiovascular: negative for chest pain, dyspnea on exertion, edema, orthopnea, palpitations, paroxysmal nocturnal dyspnea or  shortness of breath Dermatological: negative for rash Respiratory: negative for cough or wheezing Urologic: negative for hematuria Abdominal: negative for nausea, vomiting, diarrhea, bright red blood per rectum, melena, or hematemesis Neurologic: negative for visual changes, syncope, or dizziness All other systems reviewed and are otherwise negative except as noted above.    Blood pressure 128/74, pulse (!) 57, height 5\' 9"  (1.753 m), weight 202 lb (91.6 kg).  General appearance: alert and no distress Neck: no adenopathy, no carotid bruit, no JVD, supple, symmetrical, trachea midline and thyroid not enlarged, symmetric, no tenderness/mass/nodules Lungs: clear to auscultation bilaterally Heart: regular rate and rhythm, S1, S2 normal, no murmur, click, rub or gallop Extremities: extremities normal, atraumatic, no cyanosis or edema  EKG not performed today  ASSESSMENT AND PLAN:   Peripheral arterial disease [facility]) [name] returns today for follow-up of his recent angiogram which I performed 09/14/16. He has a history of remote aortobifemoral bypass grafting by Dr. Scot Dock prostate 8 years ago. He was complaining of pain and coolness in his left leg. Dopplers performed in our office 09/02/16 revealed a left ABI 0.8 with a high-frequency signal in his mid left SFA. His angiogram revealed a patent aortobifemoral bypass graft diffusely diseased left SFA from its origin down to the adductor canal (70-80%) 2 vessel runoff. His peroneal is occluded. I did not think he had percutaneous options for revascularization. He subsequently saw Dr. Scot Dock back in the office who felt that surgery was not an option either.      Lorretta Harp MD FACP,FACC,FAHA, [facility] 10/23/2016 9:51 AM

## 2016-10-23 NOTE — Patient Instructions (Signed)
Medication Instructions: Your physician recommends that you continue on your current medications as directed. Please refer to the Current Medication list given to you today.   Follow-Up: Your physician recommends that you schedule a follow-up appointment as needed with Dr. Berry.    

## 2016-10-23 NOTE — Assessment & Plan Note (Signed)
[name] returns today for follow-up of his recent angiogram which I performed 09/14/16. He has a history of remote aortobifemoral bypass grafting by Dr. Scot Dock prostate 8 years ago. He was complaining of pain and coolness in his left leg. Dopplers performed in our office 09/02/16 revealed a left ABI 0.8 with a high-frequency signal in his mid left SFA. His angiogram revealed a patent aortobifemoral bypass graft diffusely diseased left SFA from its origin down to the adductor canal (70-80%) 2 vessel runoff. His peroneal is occluded. I did not think he had percutaneous options for revascularization. He subsequently saw Dr. Scot Dock back in the office who felt that surgery was not an option either.

## 2016-11-03 ENCOUNTER — Ambulatory Visit (INDEPENDENT_AMBULATORY_CARE_PROVIDER_SITE_OTHER): Payer: Medicare HMO

## 2016-11-03 VITALS — BP 105/70 | HR 52 | Temp 97.2°F | Ht 69.25 in | Wt 200.0 lb

## 2016-11-03 DIAGNOSIS — Z0000 Encounter for general adult medical examination without abnormal findings: Secondary | ICD-10-CM | POA: Diagnosis not present

## 2016-11-03 DIAGNOSIS — Z23 Encounter for immunization: Secondary | ICD-10-CM

## 2016-11-03 NOTE — Progress Notes (Signed)
Subjective:   [name] is a 66 y.o. male who presents for an Initial Medicare Annual Wellness Visit.  Review of Systems  [name] is here today for his initial Medicare annual wellness visit.  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  Patient reports he previously enjoyed hunting and fishing but since his CVA he does not participate in either.  He exercises most days by walking around at his home and lifting 3 lb weights for strengthening.  He will normally eat 2 meals per day, does not normally eat breakfast on most days.  He does not belong to a church group or participate in any community activities.  He has 3 grown children, 2 boys and 1 girl, and 2 male grandchildren aged 83 and 16.  He lives at home with his wife, all of his children, his grandchildren and a cat.      Objective:    Today's Vitals   11/03/16 1120  BP: 105/70  Pulse: (!) 52  Temp: 97.2 F (36.2 C)  TempSrc: Oral  Weight: 200 lb (90.7 kg)  Height: 5' 9.25" (1.759 m)   Body mass index is 29.32 kg/m.  Current Medications (verified) Outpatient Encounter Prescriptions as of 11/03/2016  Medication Sig  . albuterol (PROVENTIL HFA;VENTOLIN HFA) 108 (90 Base) MCG/ACT inhaler Inhale 2 puffs into the lungs every 6 (six) hours as needed for wheezing or shortness of breath.  Marland Kitchen aspirin EC 81 MG tablet Take 1 tablet (81 mg total) by mouth daily.  Marland Kitchen BRILINTA 90 MG TABS tablet TAKE 1 TABLET (90 MG TOTAL) BY MOUTH 2 (TWO) TIMES DAILY.  Marland Kitchen ezetimibe (ZETIA) 10 MG tablet Take 1 tablet (10 mg total) by mouth daily. (Patient taking differently: Take 10 mg by mouth daily. Takes 1/2 tablet daily)  . fluocinonide cream (LIDEX) 5.39 % Apply 1 application topically 2 (two) times daily as needed.  . isosorbide mononitrate (IMDUR) 30 MG 24 hr tablet Take 1 tablet (30 mg total) by mouth daily.  . metoprolol tartrate (LOPRESSOR) 25 MG tablet Take 0.5 tablets (12.5 mg  total) by mouth 2 (two) times daily.  . nitroGLYCERIN (NITROSTAT) 0.4 MG SL tablet Place 1 tablet (0.4 mg total) under the tongue every 5 (five) minutes as needed for chest pain (up to 3 doses).  . ticagrelor (BRILINTA) 90 MG TABS tablet Take 90 mg by mouth 2 (two) times daily.   . [DISCONTINUED] OVER THE COUNTER MEDICATION Take 1,200 mg by mouth every morning. EDTA 600 mg per cap   (Ethylenediaminetetraacetic acid)   No facility-administered encounter medications on file as of 11/03/2016.     Allergies (verified) Contrast media [iodinated diagnostic agents]; Other; and Statins   History: Past Medical History:  Diagnosis Date  . CAD (coronary artery disease) 12/2008   a. cath 11/2011 s/p DES to LAD b. anterior STEMI 09/04/2014 cath DES to occluded prox LAD, 40% AV groove LCx and 30% mid RCA  . Cancer [facility]) 06/2012   prostate, larynx  . CHF (congestive heart failure) ([place])   . COPD (chronic obstructive pulmonary disease) ([place])   . CVA (cerebral infarction) 06/2009  . Elevated PSA 02/2012   6.54  . Hyperlipidemia    intolerant to statins  . Myocardial infarction ([place])   . PVD (peripheral vascular disease) ([place])    Aortobifemoral Bypass 2010, Thrombectomy and repair right brachial artery   . Radiation 08/10/2012-09/22/2012   6525 cGy to larynx  .  Stroke ([place])   . Tobacco abuse   . Unstable angina [facility])    Past Surgical History:  Procedure Laterality Date  . Aortobifemoral bypass  2010  . CORONARY ANGIOPLASTY WITH STENT PLACEMENT  11/2011  . larynx biopsy  07/12/12  . LEFT HEART CATHETERIZATION WITH CORONARY ANGIOGRAM N/A 12/17/2011   Procedure: LEFT HEART CATHETERIZATION WITH CORONARY ANGIOGRAM;  Surgeon: Hillary Bow, MD;  Location: [facility] CATH LAB;  Service: Cardiovascular;  Laterality: N/A;  . LEFT HEART CATHETERIZATION WITH CORONARY ANGIOGRAM N/A 09/04/2014   Procedure: LEFT HEART CATHETERIZATION WITH CORONARY ANGIOGRAM;  Surgeon: Troy Sine, MD;  Location: [facility] CATH LAB;  Service:  Cardiovascular;  Laterality: N/A;  . LOWER EXTREMITY ANGIOGRAPHY Left 09/14/2016   Procedure: Lower Extremity Angiography;  Surgeon: Lorretta Harp, MD;  Location: [place] CV LAB;  Service: Cardiovascular;  Laterality: Left;  . PERCUTANEOUS CORONARY STENT INTERVENTION (PCI-S) N/A 12/17/2011   Procedure: PERCUTANEOUS CORONARY STENT INTERVENTION (PCI-S);  Surgeon: Hillary Bow, MD;  [facility] C[facility] LAB;  Service: Cardiovascular;  Laterality: N/A;  . PROSTATE BIOPSY  07/12/12   Gleason's grade 3+3+6  . PROSTATECTOMY  12/2015  . 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 Coronary artery disease Father 27  . Heart disease Father   . Coronary artery disease Brother 62    died with AMI  . Heart disease Brother   . Breast cancer Mother   . Stroke Maternal Grandmother   . Cancer Paternal Grandmother    Social History   Occupational History  .      disabled   Social History Main Topics  . Smoking status: Current Every Day Smoker    Packs/day: 0.50    Years: 45.00    Types: Cigarettes    Start date: 10/07/1967  . Smokeless tobacco: Never Used  . Alcohol use No  . Drug use: No  . Sexual activity: Yes   Tobacco Counseling Patient is an everyday smoker who has recently cut back on his cigarette consumption.  He was smoking between a pack and a pack and a half daily, but has decreased to a 1/2 pack per day.  Patient reports he has started reading more and doing word search puzzles to occupy his mind which help him with cutting back on cigarettes.  Activities of Daily Living In your present state of health, do you have any difficulty performing the following activities: 11/03/2016 09/14/2016  Hearing? N N  Vision? N N  Difficulty concentrating or making decisions? N N  Walking or climbing stairs? N N  Dressing or bathing? N N  Doing errands, shopping? N -  Some  recent data might be hidden    Immunizations and Health Maintenance Immunization History  Administered Date(s) Administered  . Tdap 11/03/2016   Health Maintenance Due  Topic Date Due  . Hepatitis C Screening  08-06-1950  . HIV Screening  05/13/1967    Patient Care Team: Dettinger, Fransisca Kaufmann, MD as PCP - General (Family Medicine) Myrlene Broker, MD as Attending Physician (Urology) Dorothe Pea, MD as Referring Physician (Otolaryngology) Minus Breeding, MD as Consulting Physician (Cardiology) Angelia Mould, MD as Consulting Physician (Vascular Surgery)  Indicate any recent Medical Services you may have received from other than [facility] providers in the past year (date may be approximate).    Assessment:   This is a  routine wellness examination for Marie.   Hearing/Vision screen Patient reports no changes in his vision or hearing.  He wears bifocal glasses and visits his opthamologist every 6 months because of macular degeneration.    Dietary issues and exercise activities discussed:    Goals    . Exercise 3x per week (30 min per time)    . Have 3 meals a day      Depression Screen PHQ 2/9 Scores 11/03/2016 07/17/2016 01/10/2016 06/27/2015  PHQ - 2 Score 0 0 0 0    Fall Risk Fall Risk  11/03/2016 01/10/2016  Falls in the past year? Yes No  Number falls in past yr: 1 -  Injury with Fall? No -    Cognitive Function: MMSE - Mini Mental State Exam 11/03/2016  Orientation to time 5  Orientation to Place 5  Registration 3  Attention/ Calculation 5  Recall 3  Language- name 2 objects 2  Language- repeat 1  Language- follow 3 step command 3  Language- read & follow direction 1  Write a sentence 1  Copy design 1  Total score 30    Patient performed well on the MMSE, scoring 30 out of 30 available points.    Screening Tests Health Maintenance  Topic Date Due  . Hepatitis C Screening  Jan 20, 1951  . HIV Screening  05/13/1967  . INFLUENZA VACCINE  01/27/2017    . COLONOSCOPY  08/14/2017  . TETANUS/TDAP  11/04/2026   Patient was given his Tdap vaccine today during the visit.  He refuses to have a Pneumonia or Flu vaccine.     Plan:   Patient is to followup with his PCP, Dr. Warrick Parisian, on 01/19/2017 for his 6 month followup.  At that time it is recommended that he have labwork to check his lipids and CMP.    I have personally reviewed and noted the following in the patient's chart:   . Medical and social history . Use of alcohol, tobacco or illicit drugs  . Current medications and supplements . Functional ability and status . Nutritional status . Physical activity . Advanced directives . List of other physicians . Hospitalizations, surgeries, and ER visits in previous 12 months . Vitals . Screenings to include cognitive, depression, and falls . Referrals and appointments  In addition, I have reviewed and discussed with patient certain preventive protocols, quality metrics, and best practice recommendations. A written personalized care plan for preventive services as well as general preventive health recommendations were provided to patient.     Burnadette Pop, LPN   07/07/1476    I have reviewed and agree with the above AWV documentation.   Evelina Dun, FNP

## 2016-11-03 NOTE — Patient Instructions (Addendum)
  [name] , Thank you for taking time to come for your Medicare Wellness Visit. I appreciate your ongoing commitment to your health goals. Please review the following plan we discussed and let me know if I can assist you in the future.   These are the goals we discussed: Goals    . Exercise 3x per week (30 min per time)    . Have 3 meals a day       This is a list of the screening recommended for you and due dates:  Health Maintenance  Topic Date Due  .  Hepatitis C: One time screening is recommended by Center for Disease Control  (CDC) for  adults born from [birth year] through 1964.   [birth year]  . HIV Screening  05/13/1967  . Tetanus Vaccine  05/13/1971  . Flu Shot  01/27/2017  . Colon Cancer Screening  08/14/2017   Please followup for your 6 month checkup with Dr. Warrick Parisian on 01/19/2017.

## 2016-11-20 ENCOUNTER — Other Ambulatory Visit: Payer: Self-pay | Admitting: Family Medicine

## 2016-11-20 DIAGNOSIS — J209 Acute bronchitis, unspecified: Secondary | ICD-10-CM

## 2016-12-22 DIAGNOSIS — C320 Malignant neoplasm of glottis: Secondary | ICD-10-CM | POA: Diagnosis not present

## 2017-01-19 ENCOUNTER — Encounter: Payer: Self-pay | Admitting: Internal Medicine

## 2017-01-19 ENCOUNTER — Ambulatory Visit (INDEPENDENT_AMBULATORY_CARE_PROVIDER_SITE_OTHER): Payer: Medicare HMO | Admitting: Family Medicine

## 2017-01-19 ENCOUNTER — Encounter: Payer: Self-pay | Admitting: Family Medicine

## 2017-01-19 VITALS — BP 125/74 | HR 56 | Temp 98.8°F | Ht 69.25 in | Wt 202.0 lb

## 2017-01-19 DIAGNOSIS — Z1211 Encounter for screening for malignant neoplasm of colon: Secondary | ICD-10-CM

## 2017-01-19 DIAGNOSIS — Z9079 Acquired absence of other genital organ(s): Secondary | ICD-10-CM | POA: Diagnosis not present

## 2017-01-19 DIAGNOSIS — I739 Peripheral vascular disease, unspecified: Secondary | ICD-10-CM | POA: Diagnosis not present

## 2017-01-19 DIAGNOSIS — Z8546 Personal history of malignant neoplasm of prostate: Secondary | ICD-10-CM

## 2017-01-19 DIAGNOSIS — E782 Mixed hyperlipidemia: Secondary | ICD-10-CM

## 2017-01-19 NOTE — Progress Notes (Signed)
BP 125/74   Pulse (!) 56   Temp 98.8 F (37.1 C) (Oral)   Ht 5' 9.25" (1.759 m)   Wt 202 lb (91.6 kg)   BMI 29.62 kg/m    Subjective:    Patient ID: [name], male    DOB: 10-Mar-1951, 66 y.o.   MRN: [id number]  HPI: [name] is a 66 y.o. male presenting on 01/19/2017 for Hyperlipidemia (6 mo, patient is fasting) and PVD   HPI Hyperlipidemia Patient is coming in for recheck of his hyperlipidemia. The patient is currently taking Zetia, we will recheck his levels. They deny any issues with myalgias or history of liver damage from it. They deny any focal numbness or weakness or chest pain.Patient has known peripheral arterial disease and is on purulent and aspirin   Patient has a history of prostate cancer and prostatectomy and wants to be checked for his PSA levels with our blood work so he can take the results to his urologist. He will follow-up with his urologist on the results.  Relevant past medical, surgical, family and social history reviewed and updated as indicated. Interim medical history since our last visit reviewed. Allergies and medications reviewed and updated.  Review of Systems  Constitutional: Negative for chills and fever.  Eyes: Negative for discharge.  Respiratory: Negative for shortness of breath and wheezing.   Cardiovascular: Negative for chest pain and leg swelling.  Gastrointestinal: Negative for abdominal pain.  Genitourinary: Positive for urgency (Patient has urgency and incontinence due to prostatectomy but this has not changed recently).  Musculoskeletal: Negative for back pain and gait problem.  Skin: Negative for rash.  Neurological: Negative for dizziness, weakness, light-headedness and numbness.  All other systems reviewed and are negative.   Per HPI unless specifically indicated above     Objective:    BP 125/74   Pulse (!) 56   Temp 98.8 F (37.1 C) (Oral)   Ht 5' 9.25" (1.759 m)   Wt 202 lb (91.6 kg)   BMI 29.62 kg/m     Wt Readings from Last 3 Encounters:  01/19/17 202 lb (91.6 kg)  11/03/16 200 lb (90.7 kg)  10/23/16 202 lb (91.6 kg)    Physical Exam  Constitutional: He is oriented to person, place, and time. He appears well-developed and well-nourished. No distress.  Eyes: Conjunctivae are normal. No scleral icterus.  Neck: Neck supple. No thyromegaly present.  Cardiovascular: Normal rate, regular rhythm, normal heart sounds and intact distal pulses.   No murmur heard. Pulmonary/Chest: Effort normal and breath sounds normal. No respiratory distress. He has no wheezes. He has no rales.  Musculoskeletal: Normal range of motion. He exhibits no edema.  Lymphadenopathy:    He has no cervical adenopathy.  Neurological: He is alert and oriented to person, place, and time. Coordination normal.  Skin: Skin is warm and dry. No rash noted. He is not diaphoretic.  Psychiatric: He has a normal mood and affect. His behavior is normal.  Nursing note and vitals reviewed.      Assessment & Plan:   Problem List Items Addressed This Visit      Cardiovascular and Mediastinum   Peripheral arterial disease ([place])   Relevant Orders   Lipid panel (Completed)     Other   Hyperlipidemia - Primary   Relevant Orders   Lipid panel (Completed)   S/P prostatectomy   Relevant Orders   PSA, total and free (Completed)   History of prostate cancer   Relevant Orders  PSA, total and free (Completed)    Other Visit Diagnoses    Colon cancer screening       Relevant Orders   Ambulatory referral to Gastroenterology       Follow up plan: Return in about 6 months (around 07/22/2017), or if symptoms worsen or fail to improve, for Hyperlipidemia recheck.  Counseling provided for all of the vaccine components Orders Placed This Encounter  Procedures  . Lipid panel  . PSA, total and free    Caryl Pina, MD [facility] 01/19/2017, 10:51 AM

## 2017-01-20 LAB — LIPID PANEL
CHOLESTEROL TOTAL: 228 mg/dL — AB (ref 100–199)
Chol/HDL Ratio: 6 ratio — ABNORMAL HIGH (ref 0.0–5.0)
HDL: 38 mg/dL — AB (ref >39)
LDL CALC: 136 mg/dL — AB (ref 0–99)
TRIGLYCERIDES: 270 mg/dL — AB (ref 0–149)
VLDL Cholesterol Cal: 54 mg/dL — ABNORMAL HIGH (ref 5–40)

## 2017-01-20 LAB — PSA, TOTAL AND FREE
PSA, Free: <0.01 ng/mL
Prostate Specific Ag, Serum: <0.1 ng/mL (ref 0.0–4.0)

## 2017-02-01 DIAGNOSIS — N393 Stress incontinence (female) (male): Secondary | ICD-10-CM | POA: Diagnosis not present

## 2017-02-01 DIAGNOSIS — Z8546 Personal history of malignant neoplasm of prostate: Secondary | ICD-10-CM | POA: Diagnosis not present

## 2017-03-11 ENCOUNTER — Ambulatory Visit: Payer: Medicare HMO | Admitting: Internal Medicine

## 2017-03-24 ENCOUNTER — Inpatient Hospital Stay (HOSPITAL_COMMUNITY): Admission: RE | Admit: 2017-03-24 | Payer: Medicare HMO | Source: Ambulatory Visit

## 2017-03-24 ENCOUNTER — Ambulatory Visit: Payer: Medicare HMO | Admitting: Vascular Surgery

## 2017-03-24 ENCOUNTER — Encounter (HOSPITAL_COMMUNITY): Payer: Medicare HMO

## 2017-05-09 IMAGING — DX DG CHEST 2V
2 series · 2 of 2 positions shown · non-contrast
Comparison: Chest x-ray of December 17, 2011

CLINICAL DATA: Preoperative examination prior to peripheral
angiographic procedure for left leg pain. History of coronary artery
disease, throat malignancy, prostate malignancy, current smoker.

EXAM:
CHEST  2 VIEW

[dg chest 2 view (1 of 2)]
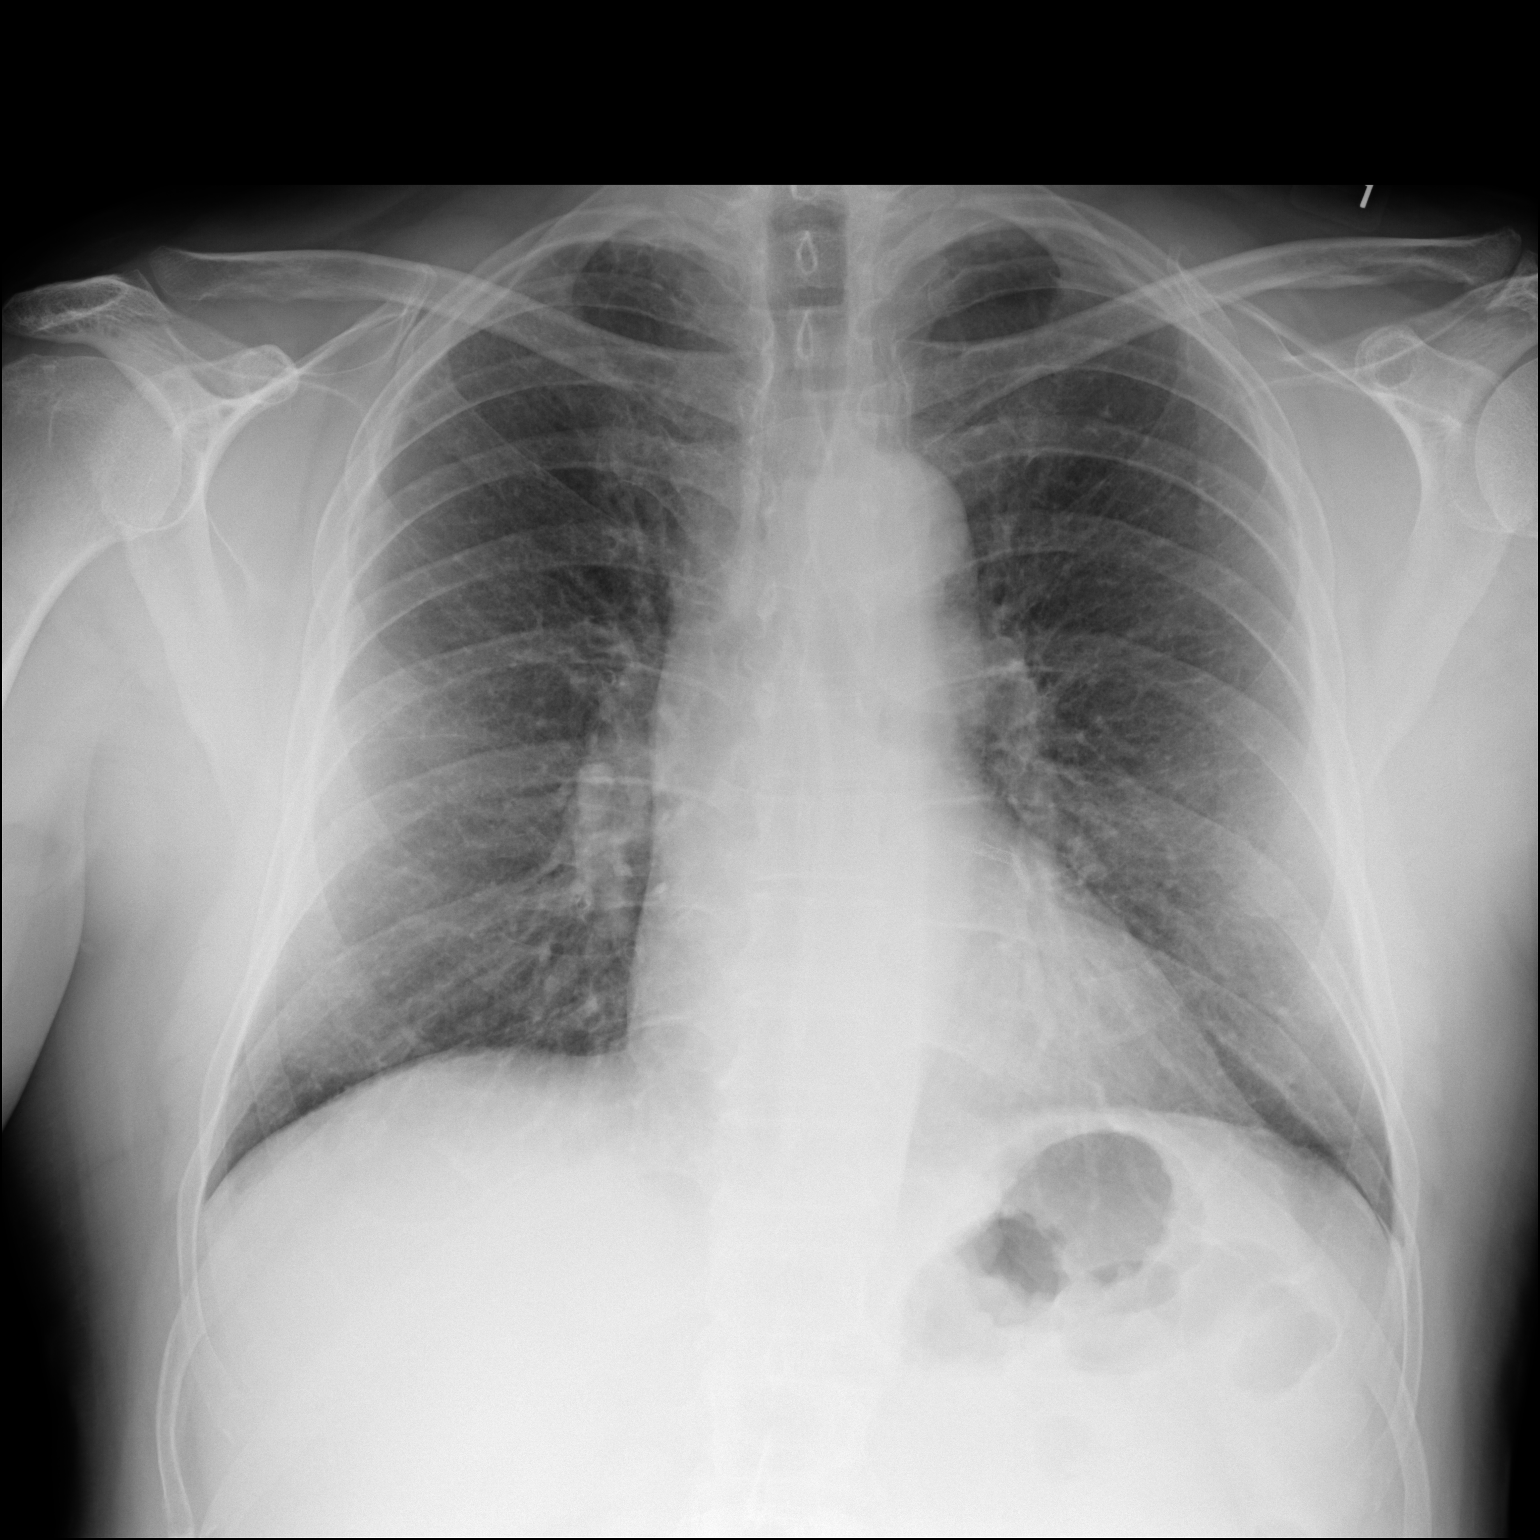

[dg chest 2 view (2 of 2)]
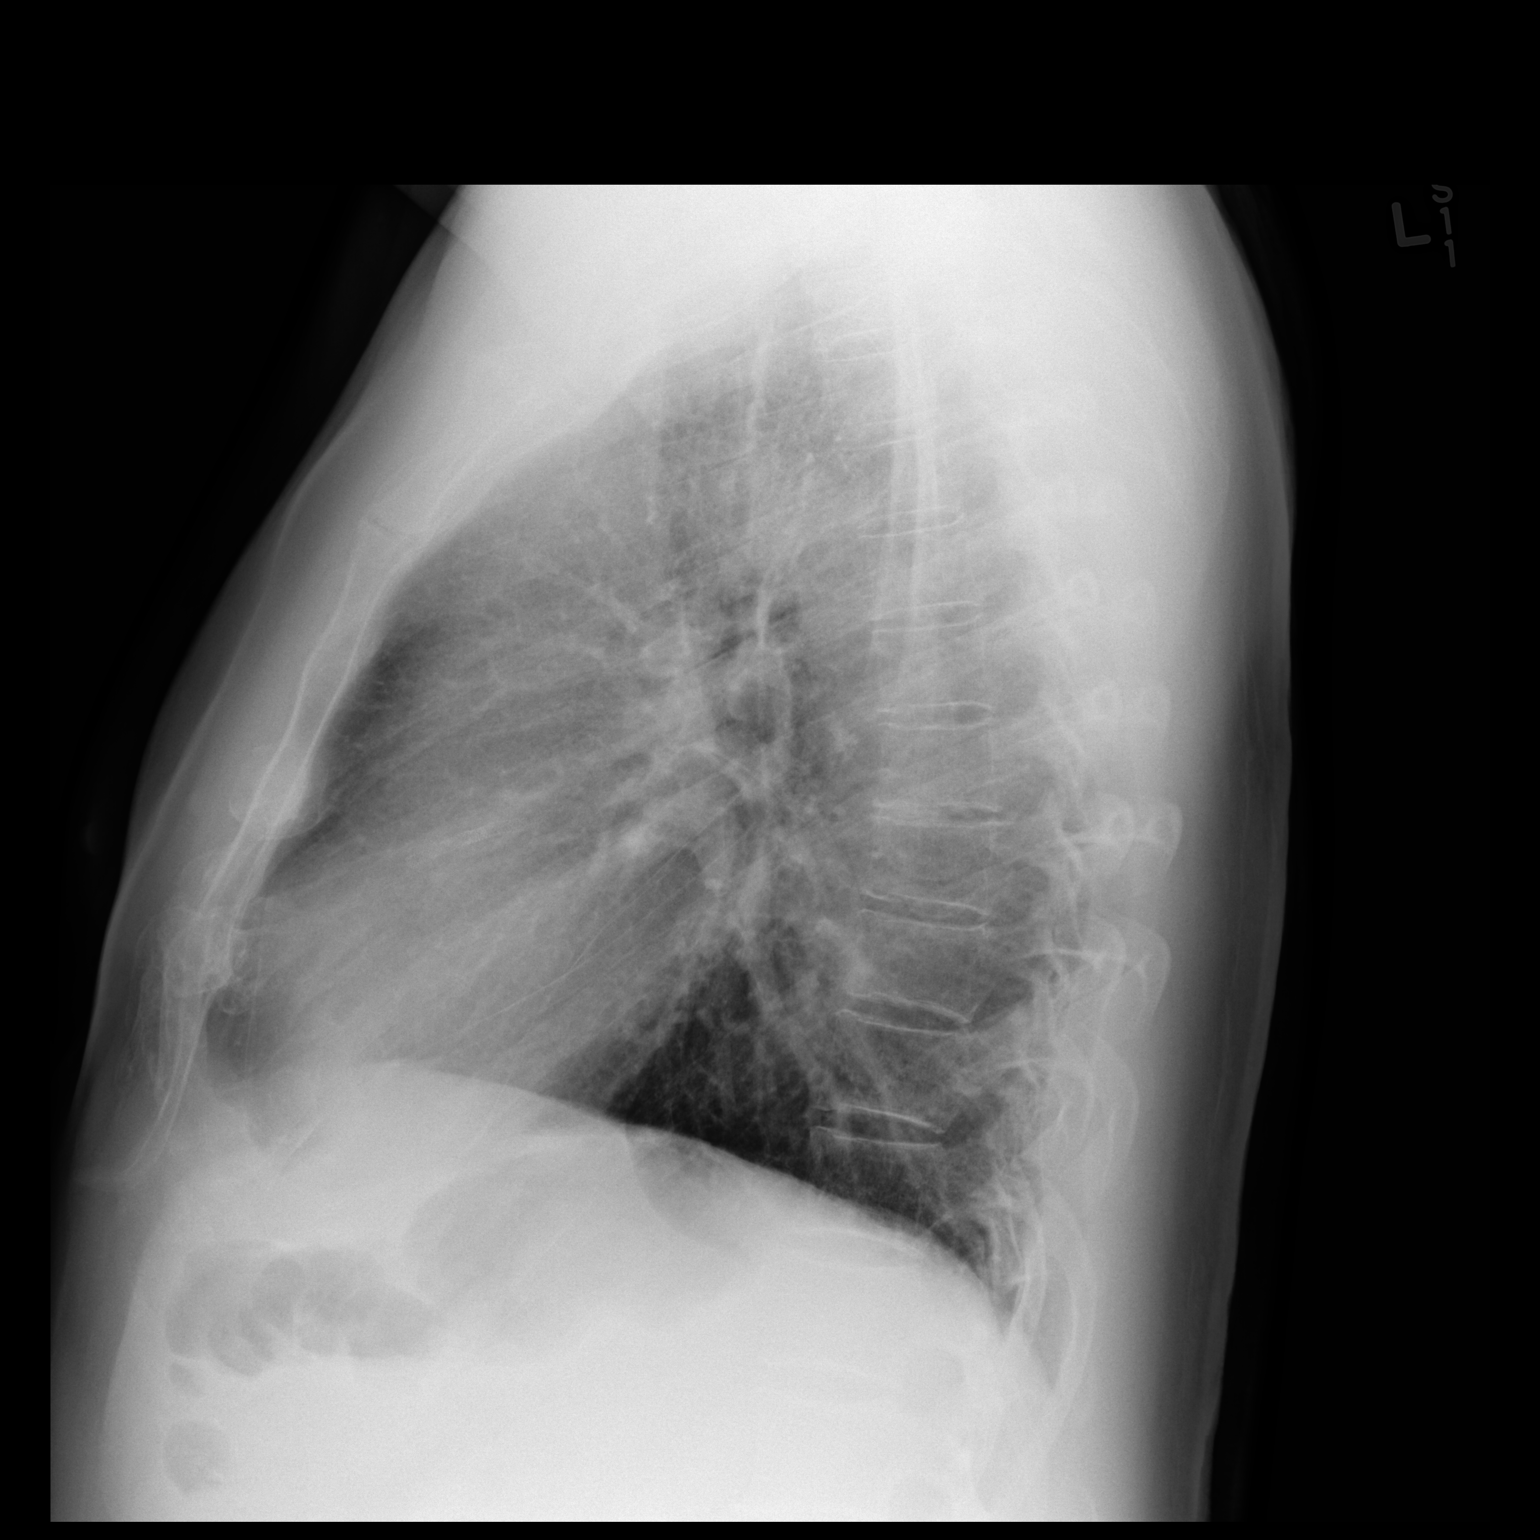

[2 of 2 positions shown; findings below may reference images not displayed]

FINDINGS: The lungs are well-expanded. The interstitial markings are coarse.
There is no alveolar infiltrate or pleural effusion. No pulmonary
parenchymal nodules or masses are observed. The heart and pulmonary
vascularity are normal. There is faint calcification in the wall of
the aortic arch. The mediastinum is normal in width. The bony thorax
exhibits no acute abnormality.
IMPRESSION: COPD. Chronic interstitial prominence likely reflects the smoking
history. No pneumonia, CHF, nor other acute cardiopulmonary
abnormality.

## 2017-05-12 ENCOUNTER — Other Ambulatory Visit: Payer: Self-pay

## 2017-05-12 ENCOUNTER — Ambulatory Visit (INDEPENDENT_AMBULATORY_CARE_PROVIDER_SITE_OTHER)
Admission: RE | Admit: 2017-05-12 | Discharge: 2017-05-12 | Disposition: A | Discharge status: Home / Self Care | Payer: Medicare HMO | Source: Ambulatory Visit | Attending: Vascular Surgery | Admitting: Vascular Surgery

## 2017-05-12 ENCOUNTER — Ambulatory Visit (HOSPITAL_COMMUNITY)
Admission: RE | Admit: 2017-05-12 | Discharge: 2017-05-12 | Disposition: A | Discharge status: Home / Self Care | Payer: Medicare HMO | Source: Ambulatory Visit | Attending: Vascular Surgery | Admitting: Vascular Surgery

## 2017-05-12 ENCOUNTER — Ambulatory Visit: Payer: Medicare HMO | Admitting: Vascular Surgery

## 2017-05-12 ENCOUNTER — Encounter: Payer: Self-pay | Admitting: Vascular Surgery

## 2017-05-12 VITALS — BP 122/77 | HR 54 | Temp 97.5°F | Resp 18 | Ht 69.0 in | Wt 203.0 lb

## 2017-05-12 DIAGNOSIS — E785 Hyperlipidemia, unspecified: Secondary | ICD-10-CM | POA: Diagnosis not present

## 2017-05-12 DIAGNOSIS — F17210 Nicotine dependence, cigarettes, uncomplicated: Secondary | ICD-10-CM | POA: Insufficient documentation

## 2017-05-12 DIAGNOSIS — I739 Peripheral vascular disease, unspecified: Secondary | ICD-10-CM

## 2017-05-12 NOTE — Progress Notes (Signed)
Patient name: [name] MRN: [id number] DOB: July 28, 1950 Sex: male    HPI:HPI: [name] is a 66 y.o. male who presents for evaluation of peripheral vascular disease. I did an aortobifemoral bypass graft on him in October 2010. This was with a 14 x 7 mm Dacron graft. The proximal anastomosis was end to side. I saw him in follow up in December 2010 and at that time he had palpable pedal pulses. He was to come back in 6 months was then lost to follow up. His postop ABIs were 100% bilaterally with triphasic Doppler signals in both feet.  He underwent a prostatectomy and [facility]. In listening to his history, it sounds like he occluded the left limb of his aortofemoral bypass graft postoperatively possibly from being up in stirrups during the case. He required thrombectomy and fasciotomy with placement of a negative pressure dressing on his fasciotomy sites. He has continued to have left leg claudication. He has no symptoms on the right side. He has no history of rest pain or nonhealing ulcers.  He has left calf pain with walking up hill and carrying heavy objects.  He states he can tolerate this limitation.    This smokes 15 cigarettes a day. He had smoked up to one and a half packs per day for many years.     Past Medical History:  Diagnosis Date  . CAD (coronary artery disease) 12/2008   a. cath 11/2011 s/p DES to LAD b. anterior STEMI 09/04/2014 cath DES to occluded prox LAD, 40% AV groove LCx and 30% mid RCA  . Cancer [facility]) 06/2012   prostate, larynx  . CHF (congestive heart failure) ([place])   . COPD (chronic obstructive pulmonary disease) ([place])   . CVA (cerebral infarction) 06/2009  . Elevated PSA 02/2012   6.54  . Hyperlipidemia    intolerant to statins  . Myocardial infarction ([place])   . PVD (peripheral vascular disease) ([place])    Aortobifemoral Bypass 2010, Thrombectomy and repair right brachial artery   . Radiation 08/10/2012-09/22/2012   6525 cGy to larynx  . Stroke  ([place])   . Tobacco abuse   . Tubular adenoma   . Unstable angina [facility])    Past Surgical History:  Procedure Laterality Date  . Aortobifemoral bypass  2010  . COLONOSCOPY W/ BIOPSIES    . CORONARY ANGIOPLASTY WITH STENT PLACEMENT  11/2011  . larynx biopsy  07/12/12  . PROSTATE BIOPSY  07/12/12   Gleason's grade 3+3+6  . PROSTATECTOMY  12/2015  . Thrombectomy and brachial artery repair     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    Family History  Problem Relation Age of Onset  . Coronary artery disease Father 62  . Heart disease Father   . Coronary artery disease Brother 52       died with AMI  . Heart disease Brother   . Breast cancer Mother   . Stroke Maternal Grandmother   . Cancer Paternal Grandmother     SOCIAL HISTORY: Social History   Socioeconomic History  . Marital status: Married    Spouse name: cheryl  . Number of children: 3  . Years of education: 47  . Highest education level: Not on file  Social Needs  . Financial resource strain: Not on file  . Food insecurity - worry: Not on file  . Food insecurity - inability: Not on file  . Transportation needs - medical: Not on file  . Transportation  needs - non-medical: Not on file  Occupational History    Comment: disabled  Tobacco Use  . Smoking status: Current Every Day Smoker    Packs/day: 0.50    Years: 45.00    Pack years: 22.50    Types: Cigarettes    Start date: 10/07/1967  . Smokeless tobacco: Never Used  Substance and Sexual Activity  . Alcohol use: No    Alcohol/week: 0.0 oz  . Drug use: No  . Sexual activity: Yes  Other Topics Concern  . Not on file  Social History Narrative   Patient lives with his wife [name]).   Disabled.   Education high school.   Patient right handed    Allergies  Allergen Reactions  . Contrast Media [Iodinated Diagnostic Agents]   . Other Itching, Swelling and Dermatitis    IV dye  . Statins Other (See Comments)    Muscle pain     Current Outpatient Medications  Medication Sig Dispense Refill  . aspirin EC 81 MG tablet Take 1 tablet (81 mg total) by mouth daily.    Marland Kitchen BRILINTA 90 MG TABS tablet TAKE 1 TABLET (90 MG TOTAL) BY MOUTH 2 (TWO) TIMES DAILY. 60 tablet 8  . ezetimibe (ZETIA) 10 MG tablet Take 1 tablet (10 mg total) by mouth daily. (Patient taking differently: Take 10 mg by mouth daily. Takes 1/2 tablet daily) 30 tablet 5  . fluocinonide cream (LIDEX) 8.29 % Apply 1 application topically 2 (two) times daily as needed.    . isosorbide mononitrate (IMDUR) 30 MG 24 hr tablet Take 1 tablet (30 mg total) by mouth daily. 30 tablet 11  . metoprolol tartrate (LOPRESSOR) 25 MG tablet Take 0.5 tablets (12.5 mg total) by mouth 2 (two) times daily. 30 tablet 5  . ticagrelor (BRILINTA) 90 MG TABS tablet Take 90 mg by mouth 2 (two) times daily.     . VENTOLIN HFA 108 (90 Base) MCG/ACT inhaler INHALE 2 PUFFS INTO THE LUNGS EVERY 6 (SIX) HOURS AS NEEDED FOR WHEEZING OR SHORTNESS OF BREATH. 18 Inhaler 0  . nitroGLYCERIN (NITROSTAT) 0.4 MG SL tablet Place 1 tablet (0.4 mg total) under the tongue every 5 (five) minutes as needed for chest pain (up to 3 doses). 25 tablet 5   No current facility-administered medications for this visit.     ROS:   General:  No weight loss, Fever, chills  HEENT: No recent headaches, no nasal bleeding, no visual changes, no sore throat  Neurologic: No dizziness, blackouts, seizures. No recent symptoms of stroke or mini- stroke. No recent episodes of slurred speech, or temporary blindness.  Cardiac: No recent episodes of chest pain/pressure, no shortness of breath at rest.  positive shortness of breath with exertion.  Denies history of atrial fibrillation or irregular heartbeat  Vascular: No history of rest pain in feet.  No history of claudication.  No history of non-healing ulcer, positive history of DVT   Pulmonary: No home oxygen, no productive cough, no hemoptysis,  No asthma or  wheezing  Musculoskeletal:  [ ]  Arthritis, [ ]  Low back pain,  [ ]  Joint pain  Hematologic:No history of hypercoagulable state.  No history of easy bleeding.  No history of anemia  Gastrointestinal: No hematochezia or melena,  No gastroesophageal reflux, no trouble swallowing  Urinary: [ ]  chronic Kidney disease, [ ]  on HD - [ ]  MWF or [ ]  TTHS, [ ]  Burning with urination, [ ]  Frequent urination, [ ]  Difficulty urinating;   Skin: No  rashes  Psychological: No history of anxiety,  No history of depression   Physical Examination  Vitals:   05/12/17 1406  BP: 122/77  Pulse: (!) 54  Resp: 18  Temp: (!) 97.5 F (36.4 C)  TempSrc: Oral  SpO2: 95%  Weight: 203 lb (92.1 kg)  Height: 5\' 9"  (1.753 m)    Body mass index is 29.98 kg/m.  General:  Alert and oriented, no acute distress HEENT: Normal Neck: No bruit or JVD Pulmonary: Clear to auscultation bilaterally Cardiac: Regular Rate and Rhythm without murmur Abdomen: Soft, non-tender, non-distended, no mass, no scars Skin: No rash Extremity Pulses:  2+ radial, brachial, femoral, dorsalis pedis, posterior tibial pulses bilaterally Musculoskeletal: No deformity or edema  Neurologic: Upper and lower extremity motor 5/5 and symmetric  DATA:  Right triphasic 98% flow Left Monophasic flow 66%, TBI pressure of 90  ASSESSMENT:   aortobifemoral bypass graft left superficial femoral artery occlusive disease Left LE claudication and history of left LE DVT    PLAN:   He will continue with conservative management of claudication in the left LE.  He has no history of wounds or rest pain.   Dr. Scot Dock has discussed  That it  would require a left femoropopliteal bypass graft to address this disease. The main risk associated with this is the risk of infection. He's now had 2 operations in the left groin most recently in July 2017 after his emergent surgery. This puts him at increased risk for infection.  He understands and will continue  with maximum medical management and wants to avoid surgery if possible.  He will f/u in 1 year for repeat ABI's.       Theda Sers, Jabreel Chimento MAUREEN PA-C [facility]  The patient was seen in conjunction with Dr. Scot Dock today

## 2017-05-13 NOTE — Addendum Note (Signed)
Addended by: Lianne Cure A on: 05/13/2017 03:52 PM   Modules accepted: Orders

## 2017-05-18 ENCOUNTER — Telehealth: Payer: Self-pay | Admitting: Cardiology

## 2017-05-18 DIAGNOSIS — C320 Malignant neoplasm of glottis: Secondary | ICD-10-CM | POA: Diagnosis not present

## 2017-05-18 DIAGNOSIS — Z720 Tobacco use: Secondary | ICD-10-CM | POA: Diagnosis not present

## 2017-05-18 NOTE — Telephone Encounter (Signed)
Pt of Dr. Percival Spanish Hx of throat ca  His ENT wishes to recheck a cervical soft tissue biopsy - 5 year follow-up for prior surgery  - did not request a surgical clearance but does need OK to hold Brilinta for a few days. Can relay this information directly to patient.  Informed Mr. Staver I would route to Dr. Percival Spanish and f/u when answer given - he states thanks.

## 2017-05-18 NOTE — Telephone Encounter (Signed)
OK to hold Brilinta for the procedure.  Needs to resume DAPT afterward.

## 2017-05-18 NOTE — Telephone Encounter (Signed)
Pt advised of recommendation and voiced understanding. He is aware to call if further needs, stated thanks for call.

## 2017-05-18 NOTE — Telephone Encounter (Signed)
Pt wants to know if he can stop his Brilinta for his Brilinta?

## 2017-05-28 DIAGNOSIS — D380 Neoplasm of uncertain behavior of larynx: Secondary | ICD-10-CM | POA: Diagnosis not present

## 2017-05-28 DIAGNOSIS — Z8521 Personal history of malignant neoplasm of larynx: Secondary | ICD-10-CM | POA: Diagnosis not present

## 2017-05-28 DIAGNOSIS — J381 Polyp of vocal cord and larynx: Secondary | ICD-10-CM | POA: Diagnosis not present

## 2017-07-08 ENCOUNTER — Telehealth: Payer: Self-pay | Admitting: Cardiovascular Disease

## 2017-07-08 MED ORDER — TICAGRELOR 90 MG PO TABS: 90.0000 mg | ORAL_TABLET | Freq: Two times a day (BID) | ORAL | 8 refills | Status: DC

## 2017-07-08 NOTE — Addendum Note (Signed)
Addended by: Leanord Asal T on: 07/08/2017 12:00 PM   Modules accepted: Orders

## 2017-07-08 NOTE — Telephone Encounter (Signed)
New Message    *STAT* If patient is at the pharmacy, call can be transferred to refill team.   1. Which medications need to be refilled? (please list name of each medication and dose if known) BRILINTA 90mg    2. Which pharmacy/location (including street and city if local pharmacy) is medication to be sent to? [facility]  3. Do they need a 30 day or 90 day supply? [place]

## 2017-07-23 ENCOUNTER — Encounter: Payer: Self-pay | Admitting: Family Medicine

## 2017-07-23 ENCOUNTER — Ambulatory Visit (INDEPENDENT_AMBULATORY_CARE_PROVIDER_SITE_OTHER): Payer: Medicare HMO | Admitting: Family Medicine

## 2017-07-23 VITALS — BP 130/71 | HR 55 | Temp 98.1°F | Ht 69.0 in | Wt 206.0 lb

## 2017-07-23 DIAGNOSIS — C61 Malignant neoplasm of prostate: Secondary | ICD-10-CM | POA: Diagnosis not present

## 2017-07-23 DIAGNOSIS — I25110 Atherosclerotic heart disease of native coronary artery with unstable angina pectoris: Secondary | ICD-10-CM

## 2017-07-23 DIAGNOSIS — Z9079 Acquired absence of other genital organ(s): Secondary | ICD-10-CM | POA: Diagnosis not present

## 2017-07-23 DIAGNOSIS — I739 Peripheral vascular disease, unspecified: Secondary | ICD-10-CM

## 2017-07-23 DIAGNOSIS — E782 Mixed hyperlipidemia: Secondary | ICD-10-CM | POA: Diagnosis not present

## 2017-07-23 DIAGNOSIS — Z720 Tobacco use: Secondary | ICD-10-CM

## 2017-07-23 NOTE — Progress Notes (Signed)
BP 130/71   Pulse (!) 55   Temp 98.1 F (36.7 C) (Oral)   Ht '5\' 9"'  (1.753 m)   Wt 206 lb (93.4 kg)   BMI 30.42 kg/m    Subjective:    Patient ID: [name], male    DOB: 1951-04-19, 67 y.o.   MRN: [id number]  HPI: [name] is a 67 y.o. male presenting on 07/23/2017 for Hyperlipidemia (patient is fasting)   HPI Hyperlipidemia Patient is coming in for recheck of his hyperlipidemia. The patient is currently taking Zetia, patient has been intolerant of statins previously. They deny any issues with myalgias or history of liver damage from it. They deny any focal numbness or weakness or chest pain.   CAD and PAD Patient has known CAD and PAD and is on beta-blockers for this and still has claudication but they are managing it with cardiovascular.  History of prostate cancer, wants to recheck his PSA today.  He is following up with a urologist for this.  Relevant past medical, surgical, family and social history reviewed and updated as indicated. Interim medical history since our last visit reviewed. Allergies and medications reviewed and updated.  Review of Systems  Constitutional: Negative for chills and fever.  Respiratory: Negative for shortness of breath and wheezing.   Cardiovascular: Negative for chest pain and leg swelling.  Gastrointestinal: Negative for abdominal pain and constipation.  Genitourinary: Positive for difficulty urinating (Leaks urine frequently due to prostatectomy), frequency and urgency.  Musculoskeletal: Positive for myalgias. Negative for back pain and gait problem.  Skin: Negative for rash.  Neurological: Negative for dizziness, weakness, light-headedness and headaches.  All other systems reviewed and are negative.   Per HPI unless specifically indicated above   Allergies as of 07/23/2017      Reactions   Contrast Media [iodinated Diagnostic Agents]    Other Itching, Swelling, Dermatitis   IV dye   Statins Other (See Comments)   Muscle pain      Medication List        Accurate as of 07/23/17 11:44 AM. Always use your most recent med list.          aspirin EC 81 MG tablet Take 1 tablet (81 mg total) by mouth daily.   ezetimibe 10 MG tablet Commonly known as:  ZETIA Take 1 tablet (10 mg total) by mouth daily.   fluocinonide cream 0.05 % Commonly known as:  LIDEX Apply 1 application topically 2 (two) times daily as needed.   isosorbide mononitrate 30 MG 24 hr tablet Commonly known as:  IMDUR Take 1 tablet (30 mg total) by mouth daily.   metoprolol tartrate 25 MG tablet Commonly known as:  LOPRESSOR Take 0.5 tablets (12.5 mg total) by mouth 2 (two) times daily.   nitroGLYCERIN 0.4 MG SL tablet Commonly known as:  NITROSTAT Place 1 tablet (0.4 mg total) under the tongue every 5 (five) minutes as needed for chest pain (up to 3 doses).   ticagrelor 90 MG Tabs tablet Commonly known as:  BRILINTA Take 1 tablet (90 mg total) by mouth 2 (two) times daily.   VENTOLIN HFA 108 (90 Base) MCG/ACT inhaler Generic drug:  albuterol INHALE 2 PUFFS INTO THE LUNGS EVERY 6 (SIX) HOURS AS NEEDED FOR WHEEZING OR SHORTNESS OF BREATH.          Objective:    BP 130/71   Pulse (!) 55   Temp 98.1 F (36.7 C) (Oral)   Ht '5\' 9"'  (1.753 m)  Wt 206 lb (93.4 kg)   BMI 30.42 kg/m   Wt Readings from Last 3 Encounters:  07/23/17 206 lb (93.4 kg)  05/12/17 203 lb (92.1 kg)  01/19/17 202 lb (91.6 kg)    Physical Exam  Constitutional: He is oriented to person, place, and time. He appears well-developed and well-nourished. No distress.  Eyes: Conjunctivae are normal. No scleral icterus.  Neck: Neck supple. No thyromegaly present.  Cardiovascular: Normal rate, regular rhythm, normal heart sounds and intact distal pulses.  No murmur heard. Pulmonary/Chest: Effort normal and breath sounds normal. No respiratory distress. He has no wheezes. He has no rales.  Musculoskeletal: Normal range of motion. He exhibits no edema.   Lymphadenopathy:    He has no cervical adenopathy.  Neurological: He is alert and oriented to person, place, and time. Coordination normal.  Skin: Skin is warm and dry. No rash noted. He is not diaphoretic.  Psychiatric: He has a normal mood and affect. His behavior is normal.  Nursing note and vitals reviewed.       Assessment & Plan:   Problem List Items Addressed This Visit      Cardiovascular and Mediastinum   CAD (coronary artery disease)   Relevant Orders   CMP14+EGFR   CBC with Differential/Platelet   PAD (peripheral artery disease) (HCC)     Genitourinary   Prostate cancer ([place])   Relevant Orders   PSA, total and free     Other   Hyperlipidemia - Primary   Relevant Orders   Lipid panel   Tobacco abuse   S/P prostatectomy   Relevant Orders   PSA, total and free       Follow up plan: Return in about 6 months (around 01/20/2018), or if symptoms worsen or fail to improve, for Cholesterol and CAD recheck.  Counseling provided for all of the vaccine components Orders Placed This Encounter  Procedures  . CMP14+EGFR  . CBC with Differential/Platelet  . Lipid panel    Caryl Pina, MD [facility] 07/23/2017, 11:44 AM

## 2017-07-24 LAB — PSA, TOTAL AND FREE: PSA, Free: <0.01 ng/mL

## 2017-07-24 LAB — CMP14+EGFR
A/G RATIO: 1.5 (ref 1.2–2.2)
ALT: 23 IU/L (ref 0–44)
AST: 19 IU/L (ref 0–40)
Albumin: 4.3 g/dL (ref 3.6–4.8)
Alkaline Phosphatase: 84 IU/L (ref 39–117)
BILIRUBIN TOTAL: 0.4 mg/dL (ref 0.0–1.2)
BUN/Creatinine Ratio: 12 (ref 10–24)
BUN: 12 mg/dL (ref 8–27)
CHLORIDE: 104 mmol/L (ref 96–106)
CO2: 20 mmol/L (ref 20–29)
Calcium: 9.6 mg/dL (ref 8.6–10.2)
Creatinine, Ser: 1.02 mg/dL (ref 0.76–1.27)
GFR calc non Af Amer: 77 mL/min/{1.73_m2} (ref >59)
GFR, EST AFRICAN AMERICAN: 89 mL/min/{1.73_m2} (ref >59)
Globulin, Total: 2.8 g/dL (ref 1.5–4.5)
Glucose: 91 mg/dL (ref 65–99)
POTASSIUM: 4.4 mmol/L (ref 3.5–5.2)
Sodium: 143 mmol/L (ref 134–144)
Total Protein: 7.1 g/dL (ref 6.0–8.5)

## 2017-07-24 LAB — LIPID PANEL
Chol/HDL Ratio: 5.5 ratio — ABNORMAL HIGH (ref 0.0–5.0)
Cholesterol, Total: 244 mg/dL — ABNORMAL HIGH (ref 100–199)
HDL: 44 mg/dL (ref >39)
LDL Calculated: 148 mg/dL — ABNORMAL HIGH (ref 0–99)
Triglycerides: 262 mg/dL — ABNORMAL HIGH (ref 0–149)
VLDL CHOLESTEROL CAL: 52 mg/dL — AB (ref 5–40)

## 2017-07-24 LAB — CBC WITH DIFFERENTIAL/PLATELET
BASOS ABS: 0 10*3/uL (ref 0.0–0.2)
Basos: 1 %
EOS (ABSOLUTE): 0.1 10*3/uL (ref 0.0–0.4)
Eos: 2 %
Hematocrit: 47.1 % (ref 37.5–51.0)
Hemoglobin: 16.7 g/dL (ref 13.0–17.7)
IMMATURE GRANS (ABS): 0 10*3/uL (ref 0.0–0.1)
Immature Granulocytes: 0 %
LYMPHS: 29 %
Lymphocytes Absolute: 2.2 10*3/uL (ref 0.7–3.1)
MCH: 32.9 pg (ref 26.6–33.0)
MCHC: 35.5 g/dL (ref 31.5–35.7)
MCV: 93 fL (ref 79–97)
Monocytes Absolute: 0.5 10*3/uL (ref 0.1–0.9)
Monocytes: 7 %
NEUTROS ABS: 4.7 10*3/uL (ref 1.4–7.0)
Neutrophils: 61 %
PLATELETS: 245 10*3/uL (ref 150–379)
RBC: 5.08 x10E6/uL (ref 4.14–5.80)
RDW: 13.7 % (ref 12.3–15.4)
WBC: 7.6 10*3/uL (ref 3.4–10.8)

## 2017-08-05 ENCOUNTER — Encounter: Payer: Self-pay | Admitting: Family Medicine

## 2017-08-05 ENCOUNTER — Ambulatory Visit (INDEPENDENT_AMBULATORY_CARE_PROVIDER_SITE_OTHER): Payer: Medicare HMO | Admitting: Family Medicine

## 2017-08-05 VITALS — BP 111/70 | HR 58 | Temp 98.2°F | Ht 69.0 in | Wt 208.0 lb

## 2017-08-05 DIAGNOSIS — L821 Other seborrheic keratosis: Secondary | ICD-10-CM | POA: Diagnosis not present

## 2017-08-05 DIAGNOSIS — D225 Melanocytic nevi of trunk: Secondary | ICD-10-CM

## 2017-08-05 DIAGNOSIS — D229 Melanocytic nevi, unspecified: Secondary | ICD-10-CM | POA: Diagnosis not present

## 2017-08-09 DIAGNOSIS — I739 Peripheral vascular disease, unspecified: Secondary | ICD-10-CM | POA: Diagnosis not present

## 2017-08-09 DIAGNOSIS — R9721 Rising PSA following treatment for malignant neoplasm of prostate: Secondary | ICD-10-CM | POA: Diagnosis not present

## 2017-08-09 DIAGNOSIS — N393 Stress incontinence (female) (male): Secondary | ICD-10-CM | POA: Diagnosis not present

## 2017-08-09 DIAGNOSIS — C140 Malignant neoplasm of pharynx, unspecified: Secondary | ICD-10-CM | POA: Diagnosis not present

## 2017-08-09 DIAGNOSIS — T82868D Thrombosis of vascular prosthetic devices, implants and grafts, subsequent encounter: Secondary | ICD-10-CM | POA: Diagnosis not present

## 2017-08-09 DIAGNOSIS — I639 Cerebral infarction, unspecified: Secondary | ICD-10-CM | POA: Diagnosis not present

## 2017-08-09 LAB — PATHOLOGY

## 2017-08-10 NOTE — Progress Notes (Signed)
   BP 111/70   Pulse (!) 58   Temp 98.2 F (36.8 C) (Oral)   Ht 5\' 9"  (1.753 m)   Wt 208 lb (94.3 kg)   BMI 30.72 kg/m    Subjective:    Patient ID: [name], male    DOB: [birth year], 67 y.o.   MRN: [id number]  HPI: Satya Buttram is a 67 y.o. male presenting on 08/05/2017 for Mole removal   HPI 2 skin lesion removal Patient has 2 skin lesions that have been irritating to him and bothering him and that he is also been concerned about ones on the top of the scalp and ones on his back.  He says they had changed in size and have been more irritating and especially the one on his scalp will bleed at times.  He denies any redness or warmth or pain with them.  Relevant past medical, surgical, family and social history reviewed and updated as indicated. Interim medical history since our last visit reviewed. Allergies and medications reviewed and updated.  Review of Systems  Constitutional: Negative for chills and fever.  Respiratory: Negative for shortness of breath and wheezing.   Cardiovascular: Negative for chest pain and leg swelling.  Skin: Positive for color change. Negative for rash.  All other systems reviewed and are negative.  Per HPI unless specifically indicated above     Objective:    BP 111/70   Pulse (!) 58   Temp 98.2 F (36.8 C) (Oral)   Ht 5\' 9"  (1.753 m)   Wt 208 lb (94.3 kg)   BMI 30.72 kg/m   Wt Readings from Last 3 Encounters:  08/05/17 208 lb (94.3 kg)  07/23/17 206 lb (93.4 kg)  05/12/17 203 lb (92.1 kg)    Physical Exam  Constitutional: He is oriented to person, place, and time. He appears well-developed and well-nourished. No distress.  Eyes: Conjunctivae are normal. No scleral icterus.  Neurological: He is alert and oriented to person, place, and time.  Skin: Skin is warm and dry. Lesion (1 skin lesion on top of scalp and one on his back, upper back) noted. No rash noted. He is not diaphoretic.  Nursing note and vitals reviewed.   Skin  lesion removal: Shave biopsy excision of 2 lesions, one on top of his scalp and one on his back.  Topical Betadine was used for cleansing.  2% lidocaine with epinephrine was used for local anesthesia, a total of 24mL.  Both lesions were sent for pathology.  Electrocautery was used for hemostasis.  Triple antibiotic was used and then it was covered by 4 x 4 and tape told in place. Procedure was tolerated well bleeding was minimal    Assessment & Plan:   Problem List Items Addressed This Visit    None    Visit Diagnoses    Atypical nevus of back    -  Primary   Relevant Orders   Pathology (Completed)   Atypical nevus       Scalp, top   Relevant Orders   Pathology (Completed)       Follow up plan: Return if symptoms worsen or fail to improve.  Counseling provided for all of the vaccine components No orders of the defined types were placed in this encounter.   Caryl Pina, MD [facility] 08/10/2017, 10:13 PM

## 2017-08-31 NOTE — Progress Notes (Signed)
HPI The patient presents for follow up of CAD.  He was hospitalized in March of 2016 with an acute anterior myocardial infarction.  Catheterization demonstrated total occlusion of the LAD proximal to his previously placed stent. He had 30% RCA stenosis and 40% stenosis elsewhere. He was on Plavix at that time and seems to have failed that medication. He did have an ejection fraction of about 45% with anteroapical akinesis.  Since I last saw him he has had surgery for prostate cancer.  He has also seen Dr. Gwenlyn Found and Dr. Scot Dock for claudication.  He has had angiography and had aortobifemoral bypass graft with diffuse 70 - 80% proximal, mid and distal left SFA stenosis. He had 2 vessel runoff with an occluded peroneal.  He was not thought to be a candidate for PTA or redo surgery.  He returns for follow up.    He has had no new angina.  He is fatigued but this is baseline.  He walks the dog 4 - 5 x per day.  The patient denies any new symptoms such as chest discomfort, neck or arm discomfort. There has been no new shortness of breath, PND or orthopnea. There have been no reported palpitations, presyncope or syncope.    Allergies  Allergen Reactions  . Contrast Media [Iodinated Diagnostic Agents]   . Other Itching, Swelling and Dermatitis    IV dye  . Statins Other (See Comments)    Muscle pain    Current Outpatient Medications  Medication Sig Dispense Refill  . aspirin EC 81 MG tablet Take 1 tablet (81 mg total) by mouth daily.    Marland Kitchen ezetimibe (ZETIA) 10 MG tablet Take 1 tablet (10 mg total) by mouth daily. (Patient taking differently: Take 10 mg by mouth daily. Takes 1/2 tablet daily) 30 tablet 5  . fluocinonide cream (LIDEX) 9.39 % Apply 1 application topically 2 (two) times daily as needed.    . isosorbide mononitrate (IMDUR) 30 MG 24 hr tablet Take 1 tablet (30 mg total) by mouth daily. 30 tablet 11  . metoprolol tartrate (LOPRESSOR) 25 MG tablet Take 0.5 tablets (12.5 mg total) by mouth  2 (two) times daily. 30 tablet 5  . ticagrelor (BRILINTA) 90 MG TABS tablet Take 1 tablet (90 mg total) by mouth 2 (two) times daily. 60 tablet 8  . VENTOLIN HFA 108 (90 Base) MCG/ACT inhaler INHALE 2 PUFFS INTO THE LUNGS EVERY 6 (SIX) HOURS AS NEEDED FOR WHEEZING OR SHORTNESS OF BREATH. 18 Inhaler 0  . nitroGLYCERIN (NITROSTAT) 0.4 MG SL tablet Place 1 tablet (0.4 mg total) under the tongue every 5 (five) minutes as needed for chest pain (up to 3 doses). 25 tablet 5   No current facility-administered medications for this visit.     Past Medical History:  Diagnosis Date  . CAD (coronary artery disease) 12/2008   a. cath 11/2011 s/p DES to LAD b. anterior STEMI 09/04/2014 cath DES to occluded prox LAD, 40% AV groove LCx and 30% mid RCA  . Cancer [facility]) 06/2012   prostate, larynx  . CHF (congestive heart failure) ([place])   . COPD (chronic obstructive pulmonary disease) ([place])   . CVA (cerebral infarction) 06/2009  . Elevated PSA 02/2012   6.54  . Hyperlipidemia    intolerant to statins  . Myocardial infarction ([place])   . PVD (peripheral vascular disease) ([place])    Aortobifemoral Bypass 2010, Thrombectomy and repair right brachial artery   . Radiation 08/10/2012-09/22/2012   6525 cGy to  larynx  . Stroke ([place])   . Tobacco abuse   . Tubular adenoma   . Unstable angina [facility])     Past Surgical History:  Procedure Laterality Date  . Aortobifemoral bypass  2010  . COLONOSCOPY W/ BIOPSIES    . CORONARY ANGIOPLASTY WITH STENT PLACEMENT  11/2011  . larynx biopsy  07/12/12  . LEFT HEART CATHETERIZATION WITH CORONARY ANGIOGRAM N/A 12/17/2011   Procedure: LEFT HEART CATHETERIZATION WITH CORONARY ANGIOGRAM;  Surgeon: Hillary Bow, MD;  Location: [facility] CATH LAB;  Service: Cardiovascular;  Laterality: N/A;  . LEFT HEART CATHETERIZATION WITH CORONARY ANGIOGRAM N/A 09/04/2014   Procedure: LEFT HEART CATHETERIZATION WITH CORONARY ANGIOGRAM;  Surgeon: Troy Sine, MD;  [facility] CATH LAB;  Service:  Cardiovascular;  Laterality: N/A;  . LOWER EXTREMITY ANGIOGRAPHY Left 09/14/2016   Procedure: Lower Extremity Angiography;  Surgeon: Lorretta Harp, MD;  Location: [facility] CV LAB;  Service: Cardiovascular;  Laterality: Left;  . PERCUTANEOUS CORONARY STENT INTERVENTION (PCI-S) N/A 12/17/2011   Procedure: PERCUTANEOUS CORONARY STENT INTERVENTION (PCI-S);  Surgeon: Hillary Bow, MD;  Location: [facility] CATH LAB;  Service: Cardiovascular;  Laterality: N/A;  . PROSTATE BIOPSY  07/12/12   Gleason's grade 3+3+6  . PROSTATECTOMY  12/2015  . 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   PHYSICAL EXAM BP 108/60   Pulse 60   Ht 5\' 9"  (1.753 m)   Wt 209 lb (94.8 kg)   BMI 30.86 kg/m   GENERAL:  Well appearing NECK:  No jugular venous distention, waveform within normal limits, carotid upstroke brisk and symmetric, no bruits, no thyromegaly LUNGS:  Clear to auscultation bilaterally CHEST:  Unremarkable HEART:  PMI not displaced or sustained,S1 and S2 within normal limits, no S3, no S4, no clicks, no rubs, no murmurs ABD:  Flat, positive bowel sounds normal in frequency in pitch, no bruits, no rebound, no guarding, no midline pulsatile mass, no hepatomegaly, no splenomegaly EXT:  2 plus pulses upper and absent DP/PT bilateral lower, no edema, no cyanosis no clubbing    EKG: Sinus rhythm, rate 59, axis within normal limits, intervals within normal limits, old anteroseptal infarct. Low voltage.  09/01/2017    ASSESSMENT AND PLAN   CAD -  He had a negative but submaximal stress test in 2017.  No change in therapy is planned.    Hyperlipidemia -  He is truly statin intolerant. He would allow referral to Lipid Clinic to consider PCSK9.   Previously he could not afford this.  Tobacco abuse -  He unfortunately can't stop smoking .  He has cut back.  He  knows I encouraged complete abstinence.  Ischemic cardiomyopathy - EF was low normal with regional wall motion abnormalities in 2017.  No further imaging is planned.

## 2017-09-01 ENCOUNTER — Ambulatory Visit: Payer: Medicare HMO | Admitting: Cardiology

## 2017-09-01 ENCOUNTER — Encounter: Payer: Self-pay | Admitting: Cardiology

## 2017-09-01 VITALS — BP 108/60 | HR 60 | Ht 69.0 in | Wt 209.0 lb

## 2017-09-01 DIAGNOSIS — E785 Hyperlipidemia, unspecified: Secondary | ICD-10-CM

## 2017-09-01 DIAGNOSIS — I2510 Atherosclerotic heart disease of native coronary artery without angina pectoris: Secondary | ICD-10-CM

## 2017-09-01 DIAGNOSIS — I255 Ischemic cardiomyopathy: Secondary | ICD-10-CM

## 2017-09-01 DIAGNOSIS — I739 Peripheral vascular disease, unspecified: Secondary | ICD-10-CM | POA: Diagnosis not present

## 2017-09-01 NOTE — Patient Instructions (Signed)
Medication Instructions:  The current medical regimen is effective;  continue present plan and medications.  You have been referred to the [facility] and will be contacted to schedule with them.  Follow-Up: Follow up in 1 year with Dr. Percival Spanish in [place].  You will receive a letter in the mail 2 months before you are due.  Please call us when you receive this letter to schedule your follow up appointment.  If you need a refill on your cardiac medications before your next appointment, please call your pharmacy.  Thank you for choosing [facility]!!

## 2017-09-07 DIAGNOSIS — C320 Malignant neoplasm of glottis: Secondary | ICD-10-CM | POA: Diagnosis not present

## 2017-10-04 NOTE — Progress Notes (Signed)
10/06/2017 [name] 04-26-1951 [id number]   HPI:  [name] is a 67 y.o. male patient of Dr Percival Spanish, who presents today for a lipid clinic evaluation. In addition to hyperlipidemia, his medical history is significant for CAD s/p STEMI (March 2016, with total LAD occlusion, proximal to a previous stent) , PAD (s/p aortobifemoral bypass wiwth 70-80% left SFA stenosis) , ischemic cardiomyopathy, carcinoma of the larynx, tobacco abuse and prior prostate cancer.    Current Medications:  Ezetimibe 10 mg daily  Cholesterol Goals:    LDL < 70  Intolerant/previously tried:  Rosuvastatin 10 mg weekly was tried in Aug 2015  He tried several statins 15-20 years ago, cannot recall which ones, but notes that every time he developed muscle aches, whole body, but mostly legs.  Was unable to walk a flight of stairs while on medications   Family history:   Father first MI in his early 46's, died at 41 from second MI  Mother had "heart disease", died in her sleep, at age 74  Brother died from MI at 73  1 living, with first MI at 24  3 sisters without heart issues  3 children, no heart issues that anyone is aware of   Diet:   Mostly home cooked meals; doesn't eat much beef, but does eat pork, chicken, frozen veggies regularly Doesn't snack much.  Exercise:    Walks dog 3-4 times per day about 15 minutes   Labs:   07/23/2017:  TC 244, TG 262, HDL 44, LDL 148  Current Outpatient Medications  Medication Sig Dispense Refill  . aspirin EC 81 MG tablet Take 1 tablet (81 mg total) by mouth daily.    Marland Kitchen ezetimibe (ZETIA) 10 MG tablet Take 1 tablet (10 mg total) by mouth daily. (Patient taking differently: Take 10 mg by mouth daily. Takes 1/2 tablet daily) 30 tablet 5  . fluocinonide cream (LIDEX) 2.77 % Apply 1 application topically 2 (two) times daily as needed.    . isosorbide mononitrate (IMDUR) 30 MG 24 hr tablet Take 1 tablet (30 mg total) by mouth daily. 30 tablet 11  . metoprolol  tartrate (LOPRESSOR) 25 MG tablet Take 0.5 tablets (12.5 mg total) by mouth 2 (two) times daily. 30 tablet 5  . nitroGLYCERIN (NITROSTAT) 0.4 MG SL tablet Place 1 tablet (0.4 mg total) under the tongue every 5 (five) minutes as needed for chest pain (up to 3 doses). 25 tablet 5  . ticagrelor (BRILINTA) 90 MG TABS tablet Take 1 tablet (90 mg total) by mouth 2 (two) times daily. 60 tablet 8  . VENTOLIN HFA 108 (90 Base) MCG/ACT inhaler INHALE 2 PUFFS INTO THE LUNGS EVERY 6 (SIX) HOURS AS NEEDED FOR WHEEZING OR SHORTNESS OF BREATH. 18 Inhaler 0   No current facility-administered medications for this visit.     Allergies  Allergen Reactions  . Contrast Media [Iodinated Diagnostic Agents]   . Other Itching, Swelling and Dermatitis    IV dye  . Statins Other (See Comments)    Muscle pain    Past Medical History:  Diagnosis Date  . CAD (coronary artery disease) 12/2008   a. cath 11/2011 s/p DES to LAD b. anterior STEMI 09/04/2014 cath DES to occluded prox LAD, 40% AV groove LCx and 30% mid RCA  . Cancer [facility]) 06/2012   prostate, larynx  . CHF (congestive heart failure) ([place])   . COPD (chronic obstructive pulmonary disease) ([place])   . CVA (cerebral infarction) 06/2009  . Elevated PSA 02/2012  6.54  . Hyperlipidemia    intolerant to statins  . Myocardial infarction ([place])   . PVD (peripheral vascular disease) ([place])    Aortobifemoral Bypass 2010, Thrombectomy and repair right brachial artery   . Radiation 08/10/2012-09/22/2012   6525 cGy to larynx  . Stroke ([place])   . Tobacco abuse   . Tubular adenoma   . Unstable angina (HCC)     There were no vitals taken for this visit.   Hyperlipidemia Patient with hyperlipidemia and CAD post STEMI and LDL at 148.  He has been intolerant to multiple statin drugs, mostly 10-15 years ago.  He re-challenged in 2015 with rosuvastatin 10 mg once weekly, however the myalgias returned.  He does continue with ezetimibe, however this will not get him to the goal  of > 70.  Will start approval process for Repatha.  Reviewed medication information with patient, including directions for use, side effects and potential benefit.  Answered all questions.  Once approved for medication, we will repeat labs after 5-6 doses to be sure medication is effective.     Tommy Medal PharmD CPP [facility]

## 2017-10-05 ENCOUNTER — Ambulatory Visit (INDEPENDENT_AMBULATORY_CARE_PROVIDER_SITE_OTHER): Payer: Medicare HMO | Admitting: Pharmacist Clinician (PhC)/ Clinical Pharmacy Specialist

## 2017-10-05 DIAGNOSIS — E785 Hyperlipidemia, unspecified: Secondary | ICD-10-CM | POA: Diagnosis not present

## 2017-10-05 NOTE — Patient Instructions (Addendum)
We will start the paperwork to get Repatha approved by your insurance company.  Once approved we will send to [facility] and you will be able to contact them.  Once you know the price of the medication, please call me and let me know if it is cost prohibitive or not.  Kiyoko Mcguirt/Raquel at [phone].  Evolocumab injection What is this medicine? EVOLOCUMAB (e voe LOK ue mab) is known as a PCSK9 inhibitor. It is used to lower the level of cholesterol in the blood. It may be used alone or in combination with other cholesterol-lowering drugs. This drug may also be used to reduce the risk of heart attack, stroke, and certain types of heart surgery in patients with heart disease. This medicine may be used for other purposes; ask your health care provider or pharmacist if you have questions. COMMON BRAND NAME(S): REPATHA What should I tell my health care provider before I take this medicine? They need to know if you have any of these conditions: -an unusual or allergic reaction to evolocumab, other medicines, foods, dyes, or preservatives -pregnant or trying to get pregnant -breast-feeding How should I use this medicine? This medicine is for injection under the skin. You will be taught how to prepare and give this medicine. Use exactly as directed. Take your medicine at regular intervals. Do not take your medicine more often than directed. It is important that you put your used needles and syringes in a special sharps container. Do not put them in a trash can. If you do not have a sharps container, call your pharmacist or health care provider to get one. Talk to your pediatrician regarding the use of this medicine in children. While this drug may be prescribed for children as young as 13 years for selected conditions, precautions do apply. Overdosage: If you think you have taken too much of this medicine contact a poison control center or emergency room at once. NOTE: This medicine is only for you.  Do not share this medicine with others. What if I miss a dose? If you miss a dose, take it as soon as you can if there are more than 7 days until the next scheduled dose, or skip the missed dose and take the next dose according to your original schedule. Do not take double or extra doses. What may interact with this medicine? Interactions are not expected. This list may not describe all possible interactions. Give your health care provider a list of all the medicines, herbs, non-prescription drugs, or dietary supplements you use. Also tell them if you smoke, drink alcohol, or use illegal drugs. Some items may interact with your medicine. What should I watch for while using this medicine? You may need blood work while you are taking this medicine. What side effects may I notice from receiving this medicine? Side effects that you should report to your doctor or health care professional as soon as possible: -allergic reactions like skin rash, itching or hives, swelling of the face, lips, or tongue -signs and symptoms of infection like fever or chills; cough; sore throat; pain or trouble passing urine Side effects that usually do not require medical attention (report to your doctor or health care professional if they continue or are bothersome): -diarrhea -nausea -muscle pain -pain, redness, or irritation at site where injected This list may not describe all possible side effects. Call your doctor for medical advice about side effects. You may report side effects to FDA at 1-800-FDA-1088. Where should I keep my  medicine? Keep out of the reach of children. You will be instructed on how to store this medicine. Throw away any unused medicine after the expiration date on the label. NOTE: This sheet is a summary. It may not cover all possible information. If you have questions about this medicine, talk to your doctor, pharmacist, or health care provider.  2018 Elsevier/Gold Standard (2016-06-01  13:21:53)

## 2017-10-06 NOTE — Assessment & Plan Note (Signed)
Patient with hyperlipidemia and CAD post STEMI and LDL at 148.  He has been intolerant to multiple statin drugs, mostly 10-15 years ago.  He re-challenged in 2015 with rosuvastatin 10 mg once weekly, however the myalgias returned.  He does continue with ezetimibe, however this will not get him to the goal of > 70.  Will start approval process for Repatha.  Reviewed medication information with patient, including directions for use, side effects and potential benefit.  Answered all questions.  Once approved for medication, we will repeat labs after 5-6 doses to be sure medication is effective.

## 2017-10-07 ENCOUNTER — Ambulatory Visit: Payer: Medicare HMO

## 2017-11-03 ENCOUNTER — Telehealth: Payer: Self-pay | Admitting: Pharmacist Clinician (PhC)/ Clinical Pharmacy Specialist

## 2017-11-03 ENCOUNTER — Other Ambulatory Visit: Payer: Self-pay | Admitting: Pharmacist Clinician (PhC)/ Clinical Pharmacy Specialist

## 2017-11-03 MED ORDER — EVOLOCUMAB 140 MG/ML ~~LOC~~ SOAJ: 140.0000 mg | SUBCUTANEOUS | 12 refills | Status: DC

## 2017-11-03 NOTE — Telephone Encounter (Signed)
Repatha approved to 04/25/18.  Will send to [facility], patient to call back if copay is cost-prohibitive.

## 2017-11-04 ENCOUNTER — Ambulatory Visit (INDEPENDENT_AMBULATORY_CARE_PROVIDER_SITE_OTHER): Payer: Medicare HMO | Admitting: *Deleted

## 2017-11-04 ENCOUNTER — Encounter: Payer: Self-pay | Admitting: *Deleted

## 2017-11-04 VITALS — BP 121/62 | HR 52 | Ht 69.0 in | Wt 209.0 lb

## 2017-11-04 DIAGNOSIS — Z0000 Encounter for general adult medical examination without abnormal findings: Secondary | ICD-10-CM | POA: Diagnosis not present

## 2017-11-04 DIAGNOSIS — Z1211 Encounter for screening for malignant neoplasm of colon: Secondary | ICD-10-CM

## 2017-11-04 NOTE — Progress Notes (Signed)
Subjective:   [name] is a 67 y.o. male who presents for a Medicare Annual Wellness Visit. [name] lives at home with wife, 2 sons, and 2 granddaughters.    Review of Systems    Patient reports that her health is worse compared to last year.  Cardiac Risk Factors include: dyslipidemia;diabetes mellitus;hypertension;male gender;smoking/ tobacco exposure;sedentary lifestyle;obesity (BMI >30kg/m2);advanced age (>7men, >81 women);family history of premature cardiovascular disease  Episodes where he wakes up at night and can't breath and then after he coughs it resolves. Nonproductive cough. ? Reflux.      Current Medications (verified) Outpatient Encounter Medications as of 11/04/2017  Medication Sig  . aspirin EC 81 MG tablet Take 1 tablet (81 mg total) by mouth daily.  Marland Kitchen ezetimibe (ZETIA) 10 MG tablet Take 1 tablet (10 mg total) by mouth daily. (Patient taking differently: Take 10 mg by mouth daily. Takes 1/2 tablet daily)  . fluocinonide cream (LIDEX) 1.60 % Apply 1 application topically 2 (two) times daily as needed.  . isosorbide mononitrate (IMDUR) 30 MG 24 hr tablet Take 1 tablet (30 mg total) by mouth daily.  . metoprolol tartrate (LOPRESSOR) 25 MG tablet Take 0.5 tablets (12.5 mg total) by mouth 2 (two) times daily.  . ticagrelor (BRILINTA) 90 MG TABS tablet Take 1 tablet (90 mg total) by mouth 2 (two) times daily.  . VENTOLIN HFA 108 (90 Base) MCG/ACT inhaler INHALE 2 PUFFS INTO THE LUNGS EVERY 6 (SIX) HOURS AS NEEDED FOR WHEEZING OR SHORTNESS OF BREATH.  Marland Kitchen Evolocumab (REPATHA SURECLICK) 737 MG/ML SOAJ Inject 140 mg into the skin every 14 (fourteen) days. (Patient not taking: Reported on 11/04/2017)  . nitroGLYCERIN (NITROSTAT) 0.4 MG SL tablet Place 1 tablet (0.4 mg total) under the tongue every 5 (five) minutes as needed for chest pain (up to 3 doses).   No facility-administered encounter medications on file as of 11/04/2017.     Allergies (verified) Contrast media  [iodinated diagnostic agents]; Other; and Statins   History: Past Medical History:  Diagnosis Date  . CAD (coronary artery disease) 12/2008   a. cath 11/2011 s/p DES to LAD b. anterior STEMI 09/04/2014 cath DES to occluded prox LAD, 40% AV groove LCx and 30% mid RCA  . Cancer [facility]) 06/2012   prostate, larynx  . CHF (congestive heart failure) ([place])   . COPD (chronic obstructive pulmonary disease) ([place])   . CVA (cerebral infarction) 06/2009  . Elevated PSA 02/2012   6.54  . Hyperlipidemia    intolerant to statins  . Myocardial infarction ([place])   . PVD (peripheral vascular disease) ([place])    Aortobifemoral Bypass 2010, Thrombectomy and repair right brachial artery   . Radiation 08/10/2012-09/22/2012   6525 cGy to larynx  . Stroke ([place])   . Tobacco abuse   . Tubular adenoma   . Unstable angina [facility])    Past Surgical History:  Procedure Laterality Date  . Aortobifemoral bypass  2010  . COLONOSCOPY W/ BIOPSIES    . CORONARY ANGIOPLASTY WITH STENT PLACEMENT  11/2011  . larynx biopsy  07/12/12  . LEFT HEART CATHETERIZATION WITH CORONARY ANGIOGRAM N/A 12/17/2011   Procedure: LEFT HEART CATHETERIZATION WITH CORONARY ANGIOGRAM;  Surgeon: Hillary Bow, MD;  Location: [facility] CATH LAB;  Service: Cardiovascular;  Laterality: N/A;  . LEFT HEART CATHETERIZATION WITH CORONARY ANGIOGRAM N/A 09/04/2014   Procedure: LEFT HEART CATHETERIZATION WITH CORONARY ANGIOGRAM;  Surgeon: Troy Sine, MD;  Location: [facility] CATH LAB;  Service: Cardiovascular;  Laterality: N/A;  .  LOWER EXTREMITY ANGIOGRAPHY Left 09/14/2016   Procedure: Lower Extremity Angiography;  Surgeon: Lorretta Harp, MD;  Location: [place] CV LAB;  Service: Cardiovascular;  Laterality: Left;  . PERCUTANEOUS CORONARY STENT INTERVENTION (PCI-S) N/A 12/17/2011   Procedure: PERCUTANEOUS CORONARY STENT INTERVENTION (PCI-S);  Surgeon: Hillary Bow, MD;  Location: [facility] CATH LAB;  Service: Cardiovascular;  Laterality: N/A;  . PROSTATE BIOPSY  07/12/12    Gleason's grade 3+3+6  . PROSTATECTOMY  12/2015  . 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 Coronary artery disease Father 24  . Heart disease Father   . Heart attack Father 2  . Coronary artery disease Brother 52       died with AMI  . Heart disease Brother   . Breast cancer Mother   . Cancer Sister        uterine  . Stroke Maternal Grandmother   . Cancer Paternal Grandmother   . Healthy Daughter   . Healthy Son   . Heart attack Brother   . Healthy Son    Social History   Socioeconomic History  . Marital status: Married    Spouse name: [name]  . Number of children: 3  . Years of education: 22  . Highest education level: High school graduate  Occupational History  . Occupation: truck Geophysicist/field seismologist    Comment: disabled  Social Needs  . Financial resource strain: Not hard at all  . Food insecurity:    Worry: Never true    Inability: Never true  . Transportation needs:    Medical: No    Non-medical: No  Tobacco Use  . Smoking status: Current Every Day Smoker    Packs/day: 0.50    Years: 51.00    Pack years: 25.50    Types: Cigarettes    Start date: 10/07/1967  . Smokeless tobacco: Never Used  Substance and Sexual Activity  . Alcohol use: No    Alcohol/week: 0.0 oz  . Drug use: No  . Sexual activity: Yes  Lifestyle  . Physical activity:    Days per week: 7 days    Minutes per session: 30 min  . Stress: Not at all  Relationships  . Social connections:    Talks on phone: Never    Gets together: More than three times a week    Attends religious service: Never    Active member of club or organization: No    Attends meetings of clubs or organizations: Never    Relationship status: Married  Other Topics Concern  . Not on file  Social History Narrative   Patient lives with his wife [name]).   Disabled.   Education high school.   Patient right  handed    Tobacco Counseling Ready to quit: No Counseling given: Yes   Clinical Intake: Pain : No/denies pain  Nutritional Status: BMI 25 -29 Overweight Diabetes: Yes CBG done?: No Did pt. bring in CBG monitor from home?: No  How often do you need to have someone help you when you read instructions, pamphlets, or other written materials from your doctor or pharmacy?: 1 - Never What is the last grade level you completed in school?: high school graduate  Interpreter Needed?: No  Information entered by :: Chong Sicilian, RN    Activities of Daily Living In your present state of health, do you have any difficulty performing the  following activities: 11/04/2017  Hearing? N  Vision? N  Comment Last eye exam was about 6 months ago. Goes to [place] yearly  Difficulty concentrating or making decisions? Y  Comment has noticed some decline over the years. For example, unable to say a word that he knows  Walking or climbing stairs? N  Dressing or bathing? N  Doing errands, shopping? N  Preparing Food and eating ? N  Using the Toilet? N  In the past six months, have you accidently leaked urine? N  Managing your Medications? N  Managing your Finances? N  Housekeeping or managing your Housekeeping? N  Some recent data might be hidden     Immunizations and Health Maintenance Immunization History  Administered Date(s) Administered  . Tdap 11/03/2016   Health Maintenance Due  Topic Date Due  . Hepatitis C Screening  1950/07/10  . HIV Screening  05/13/1967  . COLONOSCOPY  08/14/2017    Patient Care Team: Dettinger, Fransisca Kaufmann, MD as PCP - General (Family Medicine) Myrlene Broker, MD as Attending Physician (Urology) Dorothe Pea, MD as Referring Physician (Otolaryngology) Minus Breeding, MD as Consulting Physician (Cardiology) Angelia Mould, MD as Consulting Physician (Vascular Surgery)  No hospitalizations, ER visits, or surgeries this past year.   Objective:      Today's Vitals   11/04/17 1112  BP: 121/62  Pulse: (!) 52  Weight: 209 lb (94.8 kg)  Height: 5\' 9"  (1.753 m)   Body mass index is 30.86 kg/m.  Advanced Directives 11/04/2017 11/03/2016 09/14/2016 02/14/2015 09/04/2014 08/16/2014 12/17/2011  Does Patient Have a Medical Advance Directive? No No No No No No Patient does not have advance directive  Would patient like information on creating a medical advance directive? No - Patient declined No - Patient declined No - Patient declined No - patient declined information No - patient declined information No - patient declined information -  Pre-existing out of facility DNR order (yellow form or pink MOST form) - - - - - - No         Assessment:   This is a routine wellness examination for [name].  Hearing/Vision  normal or No deficits noted during visit. normal or No deficits noted during visit.  Exercise Current Exercise Habits: The patient does not participate in regular exercise at present, Exercise limited by: None identified  Goals    . Exercise 3x per week (30 min per time)      Depression Screen PHQ 2/9 Scores 11/04/2017 08/05/2017 07/23/2017 01/19/2017  PHQ - 2 Score 0 0 0 0    Fall Risk Fall Risk  11/04/2017 08/05/2017 07/23/2017 01/19/2017 11/03/2016  Falls in the past year? No No No No Yes  Number falls in past yr: - - - - 1  Injury with Fall? - - - - No    Is the patient's home free of loose throw rugs in walkways, pet beds, electrical cords, etc?   yes      Grab bars in the bathroom? no      Walkin shower? no      Shower Seat? no      Handrails on the stairs?   no      Adequate lighting?   yes  Cognitive Function: MMSE - Mini Mental State Exam 11/04/2017 11/03/2016  Orientation to time 5 5  Orientation to Place 5 5  Registration 3 3  Attention/ Calculation 5 5  Recall 3 3  Language- name 2 objects 2 2  Language- repeat  1 1  Language- follow 3 step command 3 3  Language- read & follow direction 1 1  Write a sentence 1 1   Copy design 1 1  Total score 30 30    Normal Cognitive Function Screening: Yes  Screening Tests Health Maintenance  Topic Date Due  . Hepatitis C Screening  [birth year]  . HIV Screening  05/13/1967  . COLONOSCOPY  08/14/2017  . TETANUS/TDAP  11/04/2026  . INFLUENZA VACCINE  Discontinued  . PNA vac Low Risk Adult  Discontinued     Cancer Screenings: Lung: Low Dose CT Chest recommended if Age 56-80 years, 30 pack-year currently smoking OR have quit w/in 15years. Patient does not qualify. Colorectal: Up to date? No  Additional Screenings Hepatitis C Screening: No      Plan:    Goals    . Exercise 3x per week (30 min per time)       Keep f/u with Dettinger, Fransisca Kaufmann, MD and any other specialty appointments you may have Continue current medications Move carefully to avoid falls. Use assistive devices like a can or walker if needed. Aim for at least 150 minutes of moderate activity a week. This can be chair exercises if necessary. Reading or puzzles are a good way to exercise your brain Stay connected with friends and family. Social connections are beneficial to your emotional and mental health.   Review and return a signed, witnessed, and notarized copy of Advance Directives if given.  The following health maintenance items are recommended based on your health history and current guidelines: Colon cancer screening: Yes Tdap Vaccine: no Zostavax (Shingles vaccine):yes Prevnar or Pneumovax (pneumonia vaccines): yes  Orders Placed This Encounter  Procedures  . Ambulatory referral to Gastroenterology    Referral Priority:   Routine    Referral Type:   Consultation    Referral Reason:   Specialty Services Required    Number of Visits Requested:   1    I have personally reviewed and noted the following in the patient's chart:   . Medical and social history . Use of alcohol, tobacco or illicit drugs  . Current medications and supplements . Functional ability and  status . Nutritional status . Physical activity . Advanced directives . List of other physicians . Hospitalizations, surgeries, and ER visits in previous 12 months . Vitals . Screenings to include cognitive, depression, and falls . Referrals and appointments  In addition, I have reviewed and discussed with patient certain preventive protocols, quality metrics, and best practice recommendations. A written personalized care plan for preventive services as well as general preventive health recommendations were provided to patient.     Chong Sicilian, RN   11/04/17

## 2017-11-04 NOTE — Patient Instructions (Signed)
  [name] , Thank you for taking time to come for your Medicare Wellness Visit. I appreciate your ongoing commitment to your health goals. Please review the following plan we discussed and let me know if I can assist you in the future.   These are the goals we discussed: Goals    . Exercise 3x per week (30 min per time)       This is a list of the screening recommended for you and due dates:  Health Maintenance  Topic Date Due  .  Hepatitis C: One time screening is recommended by Center for Disease Control  (CDC) for  adults born from [birth year] through 1964.   January 25, 1951  . HIV Screening  05/13/1967  . Colon Cancer Screening  08/14/2017  . Tetanus Vaccine  11/04/2026  . Flu Shot  Discontinued  . Pneumonia vaccines  Discontinued

## 2017-11-05 ENCOUNTER — Encounter: Payer: Self-pay | Admitting: Gastroenterology

## 2017-12-09 ENCOUNTER — Ambulatory Visit (INDEPENDENT_AMBULATORY_CARE_PROVIDER_SITE_OTHER): Payer: Medicare HMO | Admitting: Family Medicine

## 2017-12-09 ENCOUNTER — Encounter: Payer: Self-pay | Admitting: Family Medicine

## 2017-12-09 VITALS — BP 119/80 | HR 57 | Temp 97.7°F | Ht 69.0 in | Wt 209.0 lb

## 2017-12-09 DIAGNOSIS — L821 Other seborrheic keratosis: Secondary | ICD-10-CM | POA: Diagnosis not present

## 2017-12-09 DIAGNOSIS — D485 Neoplasm of uncertain behavior of skin: Secondary | ICD-10-CM

## 2017-12-09 DIAGNOSIS — L819 Disorder of pigmentation, unspecified: Secondary | ICD-10-CM | POA: Diagnosis not present

## 2017-12-09 NOTE — Progress Notes (Signed)
BP 119/80   Pulse (!) 57   Temp 97.7 F (36.5 C) (Oral)   Ht 5\' 9"  (1.753 m)   Wt 209 lb (94.8 kg)   BMI 30.86 kg/m    Subjective:    Patient ID: [name], male    DOB: 1951/03/08, 67 y.o.   MRN: [id number]  HPI: [name] is a 67 y.o. male presenting on 12/09/2017 for Mole removal   HPI Atypical pigmented lesion on his back Patient comes in with an atypical skin lesion on his left mid back that he wants removed because he catches it and it bothers him and it sticks up.  He also gets very irritated.  Sometimes it bleeds.  Relevant past medical, surgical, family and social history reviewed and updated as indicated. Interim medical history since our last visit reviewed. Allergies and medications reviewed and updated.  Review of Systems  Constitutional: Negative for chills and fever.  Respiratory: Negative for shortness of breath and wheezing.   Cardiovascular: Negative for chest pain and leg swelling.  Musculoskeletal: Negative for back pain and gait problem.  Skin: Negative for rash.  All other systems reviewed and are negative.   Per HPI unless specifically indicated above   Allergies as of 12/09/2017      Reactions   Contrast [place] [iodinated Diagnostic Agents]    Other Itching, Swelling, Dermatitis   IV dye   Statins Other (See Comments)   Muscle pain      Medication List        Accurate as of 12/09/17 11:27 AM. Always use your most recent med list.          aspirin EC 81 MG tablet Take 1 tablet (81 mg total) by mouth daily.   Evolocumab 140 MG/ML Soaj Commonly known as:  REPATHA SURECLICK Inject 546 mg into the skin every 14 (fourteen) days.   ezetimibe 10 MG tablet Commonly known as:  ZETIA Take 1 tablet (10 mg total) by mouth daily.   fluocinonide cream 0.05 % Commonly known as:  LIDEX Apply 1 application topically 2 (two) times daily as needed.   isosorbide mononitrate 30 MG 24 hr tablet Commonly known as:  IMDUR Take 1 tablet (30  mg total) by mouth daily.   metoprolol tartrate 25 MG tablet Commonly known as:  LOPRESSOR Take 0.5 tablets (12.5 mg total) by mouth 2 (two) times daily.   nitroGLYCERIN 0.4 MG SL tablet Commonly known as:  NITROSTAT Place 1 tablet (0.4 mg total) under the tongue every 5 (five) minutes as needed for chest pain (up to 3 doses).   ticagrelor 90 MG Tabs tablet Commonly known as:  BRILINTA Take 1 tablet (90 mg total) by mouth 2 (two) times daily.   VENTOLIN HFA 108 (90 Base) MCG/ACT inhaler Generic drug:  albuterol INHALE 2 PUFFS INTO THE LUNGS EVERY 6 (SIX) HOURS AS NEEDED FOR WHEEZING OR SHORTNESS OF BREATH.          Objective:    BP 119/80   Pulse (!) 57   Temp 97.7 F (36.5 C) (Oral)   Ht 5\' 9"  (1.753 m)   Wt 209 lb (94.8 kg)   BMI 30.86 kg/m   Wt Readings from Last 3 Encounters:  12/09/17 209 lb (94.8 kg)  11/04/17 209 lb (94.8 kg)  09/01/17 209 lb (94.8 kg)    Physical Exam  Constitutional: He is oriented to person, place, and time. He appears well-developed and well-nourished. No distress.  Eyes: Conjunctivae are normal. No  scleral icterus.  Neurological: He is alert and oriented to person, place, and time. Coordination normal.  Skin: Skin is warm and dry. Lesion noted. No rash noted. He is not diaphoretic.  Psychiatric: He has a normal mood and affect. His behavior is normal.  Nursing note and vitals reviewed.   Skin lesion removal: Topical Betadine was used for cleansing. 2% lidocaine with epinephrine was used for local anesthesia, 3 mL.  Shave biopsy of lesion and topical antibiotic was used and then it was covered by 4 x 4 and tape told in place. Procedure was tolerated well     Assessment & Plan:   Problem List Items Addressed This Visit    None    Visit Diagnoses    Atypical pigmented skin lesion    -  Primary   Relevant Orders   Pathology (Completed)       Follow up plan: Return if symptoms worsen or fail to improve.  Counseling provided for  all of the vaccine components No orders of the defined types were placed in this encounter.   Caryl Pina, MD [facility] 12/09/2017, 11:27 AM

## 2017-12-13 LAB — PATHOLOGY

## 2017-12-31 ENCOUNTER — Ambulatory Visit: Payer: Medicare HMO | Admitting: Gastroenterology

## 2018-01-07 ENCOUNTER — Ambulatory Visit (INDEPENDENT_AMBULATORY_CARE_PROVIDER_SITE_OTHER): Payer: Medicare HMO | Admitting: Family Medicine

## 2018-01-07 ENCOUNTER — Encounter: Payer: Self-pay | Admitting: Family Medicine

## 2018-01-07 VITALS — BP 118/73 | HR 56 | Temp 97.8°F | Ht 69.0 in | Wt 211.0 lb

## 2018-01-07 DIAGNOSIS — C61 Malignant neoplasm of prostate: Secondary | ICD-10-CM | POA: Diagnosis not present

## 2018-01-07 DIAGNOSIS — Z114 Encounter for screening for human immunodeficiency virus [HIV]: Secondary | ICD-10-CM | POA: Diagnosis not present

## 2018-01-07 DIAGNOSIS — E785 Hyperlipidemia, unspecified: Secondary | ICD-10-CM | POA: Diagnosis not present

## 2018-01-07 DIAGNOSIS — J209 Acute bronchitis, unspecified: Secondary | ICD-10-CM | POA: Diagnosis not present

## 2018-01-07 DIAGNOSIS — Z1159 Encounter for screening for other viral diseases: Secondary | ICD-10-CM | POA: Diagnosis not present

## 2018-01-07 MED ORDER — ALBUTEROL SULFATE HFA 108 (90 BASE) MCG/ACT IN AERS: 2.0000 | INHALATION_SPRAY | Freq: Four times a day (QID) | RESPIRATORY_TRACT | 2 refills | Status: DC | PRN

## 2018-01-07 NOTE — Progress Notes (Signed)
BP 118/73   Pulse (!) 56   Temp 97.8 F (36.6 C) (Oral)   Ht '5\' 9"'  (1.753 m)   Wt 211 lb (95.7 kg)   BMI 31.16 kg/m    Subjective:    Patient ID: [name], male    DOB: [birth year], 67 y.o.   MRN: [id number]  HPI: [name] is a 67 y.o. male presenting on 01/07/2018 for Hyperlipidemia   HPI Hyperlipidemia Patient is coming in for recheck of his hyperlipidemia. The patient is currently taking Repatha and Zetia. They deny any issues with myalgias or history of liver damage from it. They deny any focal numbness or weakness or chest pain.   Refill on albuterol inhaler because of acute on chronic bronchitis and underlying possible COPD.  Patient says is been doing okay and has been using his inhaler about every 3 or 4 days.  He says he has some nighttime symptoms but very infrequently.  He does not want a maintenance inhaler at this time.  Patient continues to smoke.  Prostate cancer recheck PSA Patient has been diagnosed with prostate cancer in the past and needs a recheck of his PSA today.  Relevant past medical, surgical, family and social history reviewed and updated as indicated. Interim medical history since our last visit reviewed. Allergies and medications reviewed and updated.  Review of Systems  Constitutional: Negative for chills and fever.  Eyes: Negative for discharge.  Respiratory: Negative for shortness of breath and wheezing.   Cardiovascular: Negative for chest pain and leg swelling.  Musculoskeletal: Positive for myalgias (Patient gets claudications in his lower legs, to be about the same that it usually is.). Negative for back pain and gait problem.  Skin: Negative for rash.  All other systems reviewed and are negative.   Per HPI unless specifically indicated above   Allergies as of 01/07/2018      Reactions   Contrast [place] [iodinated Diagnostic Agents]    Other Itching, Swelling, Dermatitis   IV dye   Statins Other (See Comments)   Muscle  pain      Medication List        Accurate as of 01/07/18 11:28 AM. Always use your most recent med list.          albuterol 108 (90 Base) MCG/ACT inhaler Commonly known as:  VENTOLIN HFA Inhale 2 puffs into the lungs every 6 (six) hours as needed for wheezing or shortness of breath.   aspirin EC 81 MG tablet Take 1 tablet (81 mg total) by mouth daily.   Evolocumab 140 MG/ML Soaj Commonly known as:  REPATHA SURECLICK Inject 468 mg into the skin every 14 (fourteen) days.   ezetimibe 10 MG tablet Commonly known as:  ZETIA Take 1 tablet (10 mg total) by mouth daily.   fluocinonide cream 0.05 % Commonly known as:  LIDEX Apply 1 application topically 2 (two) times daily as needed.   isosorbide mononitrate 30 MG 24 hr tablet Commonly known as:  IMDUR Take 1 tablet (30 mg total) by mouth daily.   metoprolol tartrate 25 MG tablet Commonly known as:  LOPRESSOR Take 0.5 tablets (12.5 mg total) by mouth 2 (two) times daily.   nitroGLYCERIN 0.4 MG SL tablet Commonly known as:  NITROSTAT Place 1 tablet (0.4 mg total) under the tongue every 5 (five) minutes as needed for chest pain (up to 3 doses).   ticagrelor 90 MG Tabs tablet Commonly known as:  BRILINTA Take 1 tablet (90 mg total) by mouth  2 (two) times daily.          Objective:    BP 118/73   Pulse (!) 56   Temp 97.8 F (36.6 C) (Oral)   Ht '5\' 9"'  (1.753 m)   Wt 211 lb (95.7 kg)   BMI 31.16 kg/m   Wt Readings from Last 3 Encounters:  01/07/18 211 lb (95.7 kg)  12/09/17 209 lb (94.8 kg)  11/04/17 209 lb (94.8 kg)    Physical Exam  Constitutional: He is oriented to person, place, and time. He appears well-developed and well-nourished. No distress.  Eyes: Conjunctivae are normal. No scleral icterus.  Neck: Neck supple. No thyromegaly present.  Cardiovascular: Normal rate, regular rhythm, normal heart sounds and intact distal pulses.  No murmur heard. Pulmonary/Chest: Effort normal and breath sounds normal. No  respiratory distress. He has no wheezes.  Musculoskeletal: Normal range of motion. He exhibits tenderness (Bilateral muscular tenderness in his calves and some tenderness in feet and ankles as well, chronic.). He exhibits no edema.  Lymphadenopathy:    He has no cervical adenopathy.  Neurological: He is alert and oriented to person, place, and time. Coordination normal.  Skin: Skin is warm and dry. No rash noted. He is not diaphoretic.  Psychiatric: He has a normal mood and affect. His behavior is normal.  Nursing note and vitals reviewed.       Assessment & Plan:   Problem List Items Addressed This Visit      Genitourinary   Prostate cancer ([place])   Relevant Orders   PSA, total and free     Other   Hyperlipidemia - Primary   Relevant Orders   Lipid panel   CMP14+EGFR    Other Visit Diagnoses    Acute bronchitis, unspecified organism       History of smoking, will give albuterol inhaler   Relevant Medications   albuterol (VENTOLIN HFA) 108 (90 Base) MCG/ACT inhaler   Need for hepatitis C screening test       Relevant Orders   Hepatitis C antibody   Screening for HIV without presence of risk factors       Relevant Orders   HIV antibody       Follow up plan: Return in about 6 months (around 07/10/2018), or if symptoms worsen or fail to improve, for Prostate and cholesterol recheck.  Counseling provided for all of the vaccine components Orders Placed This Encounter  Procedures  . Hepatitis C antibody  . Lipid panel  . CMP14+EGFR  . HIV antibody  . PSA, total and free    Caryl Pina, MD [facility] 01/07/2018, 11:28 AM

## 2018-01-08 LAB — CMP14+EGFR
ALBUMIN: 4.3 g/dL (ref 3.6–4.8)
ALK PHOS: 76 IU/L (ref 39–117)
ALT: 19 IU/L (ref 0–44)
AST: 20 IU/L (ref 0–40)
Albumin/Globulin Ratio: 1.7 (ref 1.2–2.2)
BUN/Creatinine Ratio: 14 (ref 10–24)
BUN: 15 mg/dL (ref 8–27)
Bilirubin Total: 0.5 mg/dL (ref 0.0–1.2)
CALCIUM: 9.7 mg/dL (ref 8.6–10.2)
CO2: 20 mmol/L (ref 20–29)
CREATININE: 1.07 mg/dL (ref 0.76–1.27)
Chloride: 104 mmol/L (ref 96–106)
GFR calc Af Amer: 84 mL/min/{1.73_m2} (ref >59)
GFR, EST NON AFRICAN AMERICAN: 72 mL/min/{1.73_m2} (ref >59)
GLUCOSE: 90 mg/dL (ref 65–99)
Globulin, Total: 2.6 g/dL (ref 1.5–4.5)
Potassium: 4.3 mmol/L (ref 3.5–5.2)
Sodium: 142 mmol/L (ref 134–144)
Total Protein: 6.9 g/dL (ref 6.0–8.5)

## 2018-01-08 LAB — PSA, TOTAL AND FREE: PSA, Free: <0.01 ng/mL

## 2018-01-08 LAB — LIPID PANEL
CHOLESTEROL TOTAL: 119 mg/dL (ref 100–199)
Chol/HDL Ratio: 2.6 ratio (ref 0.0–5.0)
HDL: 45 mg/dL (ref >39)
LDL Calculated: 34 mg/dL (ref 0–99)
TRIGLYCERIDES: 199 mg/dL — AB (ref 0–149)
VLDL Cholesterol Cal: 40 mg/dL (ref 5–40)

## 2018-01-08 LAB — HEPATITIS C ANTIBODY

## 2018-01-08 LAB — HIV ANTIBODY (ROUTINE TESTING W REFLEX): HIV Screen 4th Generation wRfx: NONREACTIVE

## 2018-01-18 DIAGNOSIS — C320 Malignant neoplasm of glottis: Secondary | ICD-10-CM | POA: Diagnosis not present

## 2018-01-28 ENCOUNTER — Encounter: Payer: Self-pay | Admitting: Family Medicine

## 2018-02-07 DIAGNOSIS — N393 Stress incontinence (female) (male): Secondary | ICD-10-CM | POA: Diagnosis not present

## 2018-02-07 DIAGNOSIS — Z8546 Personal history of malignant neoplasm of prostate: Secondary | ICD-10-CM | POA: Diagnosis not present

## 2018-02-07 DIAGNOSIS — T82868D Thrombosis of vascular prosthetic devices, implants and grafts, subsequent encounter: Secondary | ICD-10-CM | POA: Diagnosis not present

## 2018-04-12 ENCOUNTER — Other Ambulatory Visit: Payer: Self-pay | Admitting: Cardiovascular Disease

## 2018-05-03 ENCOUNTER — Ambulatory Visit (INDEPENDENT_AMBULATORY_CARE_PROVIDER_SITE_OTHER): Payer: Medicare HMO | Admitting: Family Medicine

## 2018-05-03 ENCOUNTER — Ambulatory Visit (INDEPENDENT_AMBULATORY_CARE_PROVIDER_SITE_OTHER): Payer: Medicare HMO

## 2018-05-03 ENCOUNTER — Encounter: Payer: Self-pay | Admitting: Family Medicine

## 2018-05-03 VITALS — BP 101/59 | HR 65 | Temp 97.4°F | Ht 69.0 in | Wt 213.6 lb

## 2018-05-03 DIAGNOSIS — M25512 Pain in left shoulder: Secondary | ICD-10-CM

## 2018-05-03 DIAGNOSIS — M542 Cervicalgia: Secondary | ICD-10-CM

## 2018-05-03 DIAGNOSIS — S4992XA Unspecified injury of left shoulder and upper arm, initial encounter: Secondary | ICD-10-CM | POA: Diagnosis not present

## 2018-05-03 DIAGNOSIS — S199XXA Unspecified injury of neck, initial encounter: Secondary | ICD-10-CM | POA: Diagnosis not present

## 2018-05-03 MED ORDER — CELECOXIB 200 MG PO CAPS: 200.0000 mg | ORAL_CAPSULE | Freq: Every day | ORAL | 5 refills | Status: DC

## 2018-05-03 MED ORDER — CYCLOBENZAPRINE HCL 10 MG PO TABS: 10.0000 mg | ORAL_TABLET | Freq: Three times a day (TID) | ORAL | 1 refills | Status: DC | PRN
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
## 2018-05-11 ENCOUNTER — Ambulatory Visit: Payer: Medicare HMO | Attending: Family Medicine | Admitting: Physical Therapy

## 2018-05-11 ENCOUNTER — Encounter: Payer: Self-pay | Admitting: Physical Therapy

## 2018-05-11 DIAGNOSIS — R293 Abnormal posture: Secondary | ICD-10-CM | POA: Diagnosis not present

## 2018-05-11 DIAGNOSIS — M542 Cervicalgia: Secondary | ICD-10-CM | POA: Diagnosis not present
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
## 2018-05-16 ENCOUNTER — Ambulatory Visit: Payer: Medicare HMO | Admitting: Physical Therapy

## 2018-05-16 ENCOUNTER — Encounter: Payer: Self-pay | Admitting: Physical Therapy

## 2018-05-16 DIAGNOSIS — M542 Cervicalgia: Secondary | ICD-10-CM

## 2018-05-16 DIAGNOSIS — R293 Abnormal posture: Secondary | ICD-10-CM

## 2018-05-16 NOTE — Therapy (Signed)
[facility] [place], [place], [address] Phone: [phone]   Fax:  [phone]  Physical Therapy Treatment  Patient Details  Name: [name] MRN: [id number] Date of Birth: 1951/06/28 Referring Provider (PT): Claretta Fraise MD.   Encounter Date: 05/16/2018  PT End of Session - 05/16/18 0935    Visit Number  2    Number of Visits  12    Date for PT Re-Evaluation  06/22/18    PT Start Time  0900    PT Stop Time  0955    PT Time Calculation (min)  55 min    Activity Tolerance  Patient tolerated treatment well    Behavior During Therapy  [facility] for tasks assessed/performed       Past Medical History:  Diagnosis Date  . CAD (coronary artery disease) 12/2008   a. cath 11/2011 s/p DES to LAD b. anterior STEMI 09/04/2014 cath DES to occluded prox LAD, 40% AV groove LCx and 30% mid RCA  . Cancer [facility]) 06/2012   prostate, larynx  . CHF (congestive heart failure) ([place])   . COPD (chronic obstructive pulmonary disease) ([place])   . CVA (cerebral infarction) 06/2009  . Elevated PSA 02/2012   6.54  . Hyperlipidemia    intolerant to statins  . Myocardial infarction ([place])   . PVD (peripheral vascular disease) ([place])    Aortobifemoral Bypass 2010, Thrombectomy and repair right brachial artery   . Radiation 08/10/2012-09/22/2012   6525 cGy to larynx  . Stroke ([place])   . Tobacco abuse   . Tubular adenoma   . Unstable angina [facility])     Past Surgical History:  Procedure Laterality Date  . Aortobifemoral bypass  2010  . COLONOSCOPY W/ BIOPSIES    . CORONARY ANGIOPLASTY WITH STENT PLACEMENT  11/2011  . larynx biopsy  07/12/12  . LEFT HEART CATHETERIZATION WITH CORONARY ANGIOGRAM N/A 12/17/2011   Procedure: LEFT HEART CATHETERIZATION WITH CORONARY ANGIOGRAM;  Surgeon: Hillary Bow, MD;  Location: [facility] CATH LAB;  Service: Cardiovascular;  Laterality: N/A;  . LEFT HEART CATHETERIZATION WITH CORONARY ANGIOGRAM N/A 09/04/2014   Procedure: LEFT  HEART CATHETERIZATION WITH CORONARY ANGIOGRAM;  Surgeon: Troy Sine, MD;  Location: [facility] CATH LAB;  Service: Cardiovascular;  Laterality: N/A;  . LOWER EXTREMITY ANGIOGRAPHY Left 09/14/2016   Procedure: Lower Extremity Angiography;  Surgeon: Lorretta Harp, MD;  Location: [place] CV LAB;  Service: Cardiovascular;  Laterality: Left;  . PERCUTANEOUS CORONARY STENT INTERVENTION (PCI-S) N/A 12/17/2011   Procedure: PERCUTANEOUS CORONARY STENT INTERVENTION (PCI-S);  Surgeon: Hillary Bow, MD;  Location: [facility] CATH LAB;  Service: Cardiovascular;  Laterality: N/A;  . PROSTATE BIOPSY  07/12/12   Gleason's grade 3+3+6  . PROSTATECTOMY  12/2015  . 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  Subjective Assessment - 05/16/18 0936    Subjective  Doing okay today.    Pertinent History  CVA; CAD; CHF; PVD; COPD and MI.    Diagnostic tests  X-ray.    Patient Stated Goals  Get out of pain.    Currently in Pain?  Yes    Pain Score  5     Pain Location  Neck    Pain Orientation  Left    Pain Descriptors / Indicators  Sharp;Aching    Pain Type  Acute pain    Pain Onset  1  to 4 weeks ago                       [facility] Adult PT Treatment/Exercise - 05/16/18 0001      Modalities   Modalities  Moist Heat;Ultrasound      Moist Heat Therapy   Number Minutes Moist Heat  20 Minutes    Moist Heat Location  Cervical      Electrical Stimulation   Electrical Stimulation Location  Left UT.    Electrical Stimulation Action  IFC    Electrical Stimulation Parameters  80-150 Hz x 20 minutes.    Electrical Stimulation Goals  Tone;Pain      Ultrasound   Ultrasound Location  Left cervical.    Ultrasound Parameters  Combo e'stim/U/S at 1.50 W/CM2 x 12 minutes.    Ultrasound Goals  Pain      Manual Therapy   Manual Therapy  Soft tissue mobilization    Soft tissue mobilization   STW/M x 11 minutes to left cervical paraspinals andUT to reduce tone.                  PT Long Term Goals - 05/11/18 1200      PT LONG TERM GOAL #1   Title  Independent with a HEP.    Time  6    Period  Weeks    Status  New      PT LONG TERM GOAL #2   Title  Increase active cervical rotation to 70 degrees+ so patient can turn head more easily while driving.    Time  6    Period  Weeks    Status  New      PT LONG TERM GOAL #3   Title  Perform ADL's with pain not > 2-3/10.    Time  6    Period  Weeks    Status  New      PT LONG TERM GOAL #4   Title  Eliminate left UE symptoms.    Time  6    Period  Weeks    Status  New            Plan - 05/16/18 0939    Clinical Impression Statement  Tone over left UT with trigger point that responded very well to STW/M today.    PT Treatment/Interventions  ADLs/Self Care Home Management;Cryotherapy;Electrical Stimulation;Moist Heat;Traction;Ultrasound;Therapeutic exercise;Therapeutic activities;Patient/family education;Passive range of motion;Manual techniques;Dry needling    PT Next Visit Plan  Chin tucks and cervical extension; active cervical range of motion; Combo e'stim/U/S to left UT f/b STW/M. Begin with aforementioned treatment but can try int traction beginning at 15# depending on response to treatment.    Consulted and Agree with Plan of Care  Patient       Patient will benefit from skilled therapeutic intervention in order to improve the following deficits and impairments:     Visit Diagnosis: Cervicalgia  Abnormal posture     Problem List Patient Active Problem List   Diagnosis Date Noted  . S/P prostatectomy 01/19/2017  . History of prostate cancer 01/19/2017  . PAD (peripheral artery disease) ([place]) 08/05/2016  . SUI (stress urinary incontinence), male 08/03/2016  . Thrombosis of aortobifemoral bypass graft ([place]) 01/28/2016  . History of stroke with residual deficit 01/03/2016  . Cardiomyopathy,  ischemic 09/06/2014  . ST elevation myocardial infarction involving left anterior descending (LAD) coronary artery ([place])   . Skin lesion of scalp 06/23/2013  . Prostate cancer (  [place]) 09/13/2012  . Squamous cell carcinoma of larynx ([place]) 09/13/2012  . Hyperlipidemia   . Tobacco abuse   . Unstable angina ([place]) 12/17/2011  . CAD (coronary artery disease) 12/27/2008    Robt Okuda, [place] MPT 05/16/2018, 10:26 AM  [facility] [address] [place], [place], [address] Phone: [phone]   Fax:  [phone]  Name: [name] MRN: [id number] Date of Birth: [birth year]

## 2018-05-18 ENCOUNTER — Ambulatory Visit (INDEPENDENT_AMBULATORY_CARE_PROVIDER_SITE_OTHER): Payer: Medicare HMO | Admitting: Family Medicine

## 2018-05-18 ENCOUNTER — Encounter: Payer: Self-pay | Admitting: Family Medicine

## 2018-05-18 VITALS — BP 111/71 | HR 65 | Temp 97.5°F | Ht 69.0 in | Wt 214.8 lb

## 2018-05-18 DIAGNOSIS — M542 Cervicalgia: Secondary | ICD-10-CM
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
## 2018-05-19 ENCOUNTER — Ambulatory Visit: Payer: Medicare HMO | Admitting: *Deleted

## 2018-05-19 DIAGNOSIS — M542 Cervicalgia: Secondary | ICD-10-CM

## 2018-05-19 DIAGNOSIS — R293 Abnormal posture: Secondary | ICD-10-CM | POA: Diagnosis not present

## 2018-05-19 NOTE — Therapy (Signed)
[facility] [place], [place], [address] Phone: [phone]   Fax:  [phone]  Physical Therapy Treatment  Patient Details  Name: [name] MRN: [id number] Date of Birth: 1951-03-24 Referring Provider (PT): Claretta Fraise MD.   Encounter Date: 05/19/2018  PT End of Session - 05/19/18 0940    Number of Visits  12    Date for PT Re-Evaluation  06/22/18    PT Start Time  0900    PT Stop Time  0949    PT Time Calculation (min)  49 min       Past Medical History:  Diagnosis Date  . CAD (coronary artery disease) 12/2008   a. cath 11/2011 s/p DES to LAD b. anterior STEMI 09/04/2014 cath DES to occluded prox LAD, 40% AV groove LCx and 30% mid RCA  . Cancer [facility]) 06/2012   prostate, larynx  . CHF (congestive heart failure) ([place])   . COPD (chronic obstructive pulmonary disease) ([place])   . CVA (cerebral infarction) 06/2009  . Elevated PSA 02/2012   6.54  . Hyperlipidemia    intolerant to statins  . Myocardial infarction ([place])   . PVD (peripheral vascular disease) ([place])    Aortobifemoral Bypass 2010, Thrombectomy and repair right brachial artery   . Radiation 08/10/2012-09/22/2012   6525 cGy to larynx  . Stroke ([place])   . Tobacco abuse   . Tubular adenoma   . Unstable angina [facility])     Past Surgical History:  Procedure Laterality Date  . Aortobifemoral bypass  2010  . COLONOSCOPY W/ BIOPSIES    . CORONARY ANGIOPLASTY WITH STENT PLACEMENT  11/2011  . larynx biopsy  07/12/12  . LEFT HEART CATHETERIZATION WITH CORONARY ANGIOGRAM N/A 12/17/2011   Procedure: LEFT HEART CATHETERIZATION WITH CORONARY ANGIOGRAM;  Surgeon: Hillary Bow, MD;  Location: [facility] CATH LAB;  Service: Cardiovascular;  Laterality: N/A;  . LEFT HEART CATHETERIZATION WITH CORONARY ANGIOGRAM N/A 09/04/2014   Procedure: LEFT HEART CATHETERIZATION WITH CORONARY ANGIOGRAM;  Surgeon: Troy Sine, MD;  Location: [facility] CATH LAB;  Service: Cardiovascular;  Laterality:  N/A;  . LOWER EXTREMITY ANGIOGRAPHY Left 09/14/2016   Procedure: Lower Extremity Angiography;  Surgeon: Lorretta Harp, MD;  Location: [place] CV LAB;  Service: Cardiovascular;  Laterality: Left;  . PERCUTANEOUS CORONARY STENT INTERVENTION (PCI-S) N/A 12/17/2011   Procedure: PERCUTANEOUS CORONARY STENT INTERVENTION (PCI-S);  Surgeon: Hillary Bow, MD;  Location: [facility] CATH LAB;  Service: Cardiovascular;  Laterality: N/A;  . PROSTATE BIOPSY  07/12/12   Gleason's grade 3+3+6  . PROSTATECTOMY  12/2015  . 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  Subjective Assessment - 05/19/18 0940    Subjective  Rxs are helping. I do get relief for several hours.    Pertinent History  CVA; CAD; CHF; PVD; COPD and MI.    Diagnostic tests  X-ray.    Patient Stated Goals  Get out of pain.    Currently in Pain?  Yes    Pain Score  5     Pain Location  Neck    Pain Orientation  Left    Pain Descriptors / Indicators  Sharp;Sore    Pain Type  Acute pain    Pain Onset  1 to 4 weeks ago    Pain Frequency  Constant  [facility] Adult PT Treatment/Exercise - 05/19/18 0001      Modalities   Modalities  Moist Heat;Ultrasound      Moist Heat Therapy   Number Minutes Moist Heat  15 Minutes    Moist Heat Location  Cervical      Electrical Stimulation   Electrical Stimulation Location  Left UT. IFC x 15 mins 80-120hz     Electrical Stimulation Goals  Tone;Pain      Ultrasound   Ultrasound Location  LT Utrap    Ultrasound Parameters  Combo estim 1.5 w/cm2 x 10 mins    Ultrasound Goals  Pain      Manual Therapy   Manual Therapy  Soft tissue mobilization    Soft tissue mobilization  STW/M x 13 minutes to left cervical paraspinals and UT to reduce tone. TPR to LT UT                  PT Long Term Goals - 05/11/18 1200      PT LONG TERM GOAL #1    Title  Independent with a HEP.    Time  6    Period  Weeks    Status  New      PT LONG TERM GOAL #2   Title  Increase active cervical rotation to 70 degrees+ so patient can turn head more easily while driving.    Time  6    Period  Weeks    Status  New      PT LONG TERM GOAL #3   Title  Perform ADL's with pain not > 2-3/10.    Time  6    Period  Weeks    Status  New      PT LONG TERM GOAL #4   Title  Eliminate left UE symptoms.    Time  6    Period  Weeks    Status  New            Plan - 05/19/18 0941    Clinical Impression Statement  Pt arrived today doing fair. He continues to have pain and tightness in the  LT side of his neck. [place] combo and STW was performed to LT side UT and cerv.paras and tolerated well. Notable tightness found in LT UT during STW. TPR was performed on LT UT, but minimal release today. Decreased pain after RX. and normal modality response.  Cerv rotation LT 32 degrees ,RT  60 degrees    Clinical Presentation  Stable    Rehab Potential  Excellent    PT Frequency  2x / week    PT Duration  6 weeks    PT Treatment/Interventions  ADLs/Self Care Home Management;Cryotherapy;Electrical Stimulation;Moist Heat;Traction;Ultrasound;Therapeutic exercise;Therapeutic activities;Patient/family education;Passive range of motion;Manual techniques;Dry needling    PT Next Visit Plan  Chin tucks and cervical extension; active cervical range of motion; Combo e'stim/U/S to left UT f/b STW/M. Begin with aforementioned treatment but can try int traction beginning at 15# depending on response to treatment.    Consulted and Agree with Plan of Care  Patient       Patient will benefit from skilled therapeutic intervention in order to improve the following deficits and impairments:  Pain, Postural dysfunction, Decreased range of motion, Increased muscle spasms  Visit Diagnosis: Cervicalgia  Abnormal posture     Problem List Patient Active Problem List   Diagnosis Date  Noted  . S/P prostatectomy 01/19/2017  . History of prostate cancer 01/19/2017  . PAD (peripheral artery disease) (  [place]) 08/05/2016  . SUI (stress urinary incontinence), male 08/03/2016  . Thrombosis of aortobifemoral bypass graft ([place]) 01/28/2016  . History of stroke with residual deficit 01/03/2016  . Cardiomyopathy, ischemic 09/06/2014  . Skin lesion of scalp 06/23/2013  . Prostate cancer ([place]) 09/13/2012  . Squamous cell carcinoma of larynx ([place]) 09/13/2012  . Hyperlipidemia   . Tobacco abuse   . Unstable angina ([place]) 12/17/2011  . CAD (coronary artery disease) 12/27/2008    RAMSEUR,CHRIS, PTA 05/19/2018, 10:09 AM  [facility] [place], [place], [address] Phone: [phone]   Fax:  [phone]  Name: [name] MRN: [id number] Date of Birth: Jan 13, 1951

## 2018-05-25 ENCOUNTER — Ambulatory Visit: Payer: Medicare HMO | Admitting: Physical Therapy

## 2018-05-25 ENCOUNTER — Encounter: Payer: Self-pay | Admitting: Physical Therapy

## 2018-05-25 DIAGNOSIS — M542 Cervicalgia: Secondary | ICD-10-CM | POA: Diagnosis not present

## 2018-05-25 DIAGNOSIS — R293 Abnormal posture: Secondary | ICD-10-CM | POA: Diagnosis not present

## 2018-05-25 NOTE — Therapy (Signed)
[facility] [place], [place], [address] Phone: [phone]   Fax:  [phone]  Physical Therapy Treatment  Patient Details  Name: [name] MRN: [id number] Date of Birth: 07/26/1950 Referring Provider (PT): Claretta Fraise MD.   Encounter Date: 05/25/2018  PT End of Session - 05/25/18 1222    Visit Number  4    Number of Visits  12    Date for PT Re-Evaluation  06/22/18    PT Start Time  1030    PT Stop Time  1119    PT Time Calculation (min)  49 min    Activity Tolerance  Patient tolerated treatment well    Behavior During Therapy  [facility] for tasks assessed/performed       Past Medical History:  Diagnosis Date  . CAD (coronary artery disease) 12/2008   a. cath 11/2011 s/p DES to LAD b. anterior STEMI 09/04/2014 cath DES to occluded prox LAD, 40% AV groove LCx and 30% mid RCA  . Cancer [facility]) 06/2012   prostate, larynx  . CHF (congestive heart failure) ([place])   . COPD (chronic obstructive pulmonary disease) ([place])   . CVA (cerebral infarction) 06/2009  . Elevated PSA 02/2012   6.54  . Hyperlipidemia    intolerant to statins  . Myocardial infarction ([place])   . PVD (peripheral vascular disease) ([place])    Aortobifemoral Bypass 2010, Thrombectomy and repair right brachial artery   . Radiation 08/10/2012-09/22/2012   6525 cGy to larynx  . Stroke ([place])   . Tobacco abuse   . Tubular adenoma   . Unstable angina [facility])     Past Surgical History:  Procedure Laterality Date  . Aortobifemoral bypass  2010  . COLONOSCOPY W/ BIOPSIES    . CORONARY ANGIOPLASTY WITH STENT PLACEMENT  11/2011  . larynx biopsy  07/12/12  . LEFT HEART CATHETERIZATION WITH CORONARY ANGIOGRAM N/A 12/17/2011   Procedure: LEFT HEART CATHETERIZATION WITH CORONARY ANGIOGRAM;  Surgeon: Hillary Bow, MD;  Location: [facility] CATH LAB;  Service: Cardiovascular;  Laterality: N/A;  . LEFT HEART CATHETERIZATION WITH CORONARY ANGIOGRAM N/A 09/04/2014   Procedure: LEFT  HEART CATHETERIZATION WITH CORONARY ANGIOGRAM;  Surgeon: Troy Sine, MD;  Location: [facility] CATH LAB;  Service: Cardiovascular;  Laterality: N/A;  . LOWER EXTREMITY ANGIOGRAPHY Left 09/14/2016   Procedure: Lower Extremity Angiography;  Surgeon: Lorretta Harp, MD;  Location: [place] CV LAB;  Service: Cardiovascular;  Laterality: Left;  . PERCUTANEOUS CORONARY STENT INTERVENTION (PCI-S) N/A 12/17/2011   Procedure: PERCUTANEOUS CORONARY STENT INTERVENTION (PCI-S);  Surgeon: Hillary Bow, MD;  Location: [facility] CATH LAB;  Service: Cardiovascular;  Laterality: N/A;  . PROSTATE BIOPSY  07/12/12   Gleason's grade 3+3+6  . PROSTATECTOMY  12/2015  . 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  Subjective Assessment - 05/25/18 1200    Subjective  I'm geeting better.  Still get some pain down my left arm on occasions.    Pertinent History  CVA; CAD; CHF; PVD; COPD and MI.    Diagnostic tests  X-ray.    Patient Stated Goals  Get out of pain.    Currently in Pain?  Yes    Pain Score  4     Pain Location  Neck    Pain Orientation  Left    Pain Descriptors / Indicators  Sharp;Sore    Pain  Type  Acute pain    Pain Onset  1 to 4 weeks ago                       [facility] Adult PT Treatment/Exercise - 05/25/18 0001      Modalities   Modalities  Electrical Stimulation;Moist Heat;Traction;Ultrasound      Moist Heat Therapy   Number Minutes Moist Heat  20 Minutes    Moist Heat Location  --   Left cervical.     Electrical Stimulation   Electrical Stimulation Location  Left UT    Electrical Stimulation Action  IFC    Electrical Stimulation Parameters  80-150 Hz x 20 minutes.    Electrical Stimulation Goals  Tone;Pain      Ultrasound   Ultrasound Location  Left UT    Ultrasound Parameters  Combo e'stim/U/S at 1.50 W/CM2 x 8 minutes.    Ultrasound Goals  Pain       Traction   Type of Traction  Cervical    Min (lbs)  5    Max (lbs)  14    Hold Time  99    Rest Time  5    Time  10                  PT Long Term Goals - 05/11/18 1200      PT LONG TERM GOAL #1   Title  Independent with a HEP.    Time  6    Period  Weeks    Status  New      PT LONG TERM GOAL #2   Title  Increase active cervical rotation to 70 degrees+ so patient can turn head more easily while driving.    Time  6    Period  Weeks    Status  New      PT LONG TERM GOAL #3   Title  Perform ADL's with pain not > 2-3/10.    Time  6    Period  Weeks    Status  New      PT LONG TERM GOAL #4   Title  Eliminate left UE symptoms.    Time  6    Period  Weeks    Status  New            Plan - 05/25/18 1224    Clinical Impression Statement  Patient very pleased with his progress. His pain is getting less intense and less frequent.    PT Next Visit Plan  Can increase traction to 16# if patient found helpful.    Consulted and Agree with Plan of Care  Patient       Patient will benefit from skilled therapeutic intervention in order to improve the following deficits and impairments:     Visit Diagnosis: Cervicalgia  Abnormal posture     Problem List Patient Active Problem List   Diagnosis Date Noted  . S/P prostatectomy 01/19/2017  . History of prostate cancer 01/19/2017  . PAD (peripheral artery disease) ([place]) 08/05/2016  . SUI (stress urinary incontinence), male 08/03/2016  . Thrombosis of aortobifemoral bypass graft ([place]) 01/28/2016  . History of stroke with residual deficit 01/03/2016  . Cardiomyopathy, ischemic 09/06/2014  . Skin lesion of scalp 06/23/2013  . Prostate cancer ([place]) 09/13/2012  . Squamous cell carcinoma of larynx ([place]) 09/13/2012  . Hyperlipidemia   . Tobacco abuse   . Unstable angina ([place]) 12/17/2011  . CAD (coronary artery disease)  12/27/2008    Rashelle Ireland, [place]  MPT 05/25/2018, 12:25 PM  [facility] [address] [place], [place], [address] Phone: [phone]   Fax:  [phone]  Name: [name] MRN: [id number] Date of Birth: [birth year]

## 2018-05-31 ENCOUNTER — Encounter: Payer: Self-pay | Admitting: Physical Therapy

## 2018-05-31 ENCOUNTER — Ambulatory Visit: Payer: Medicare HMO | Attending: Family Medicine | Admitting: Physical Therapy

## 2018-05-31 DIAGNOSIS — R293 Abnormal posture: Secondary | ICD-10-CM | POA: Diagnosis not present

## 2018-05-31 DIAGNOSIS — M542 Cervicalgia: Secondary | ICD-10-CM | POA: Diagnosis not present

## 2018-05-31 NOTE — Therapy (Signed)
[facility] [place], [place], [address] Phone: [phone]   Fax:  [phone]  Physical Therapy Treatment  Patient Details  Name: Calder Oblinger MRN: [id number] Date of Birth: [birth year] Referring Provider (PT): Claretta Fraise MD.   Encounter Date: 05/31/2018  PT End of Session - 05/31/18 0948    Visit Number  5    Number of Visits  12    Date for PT Re-Evaluation  06/22/18    PT Start Time  0948    PT Stop Time  1044    PT Time Calculation (min)  56 min    Activity Tolerance  Patient tolerated treatment well    Behavior During Therapy  [facility] for tasks assessed/performed       Past Medical History:  Diagnosis Date  . CAD (coronary artery disease) 12/2008   a. cath 11/2011 s/p DES to LAD b. anterior STEMI 09/04/2014 cath DES to occluded prox LAD, 40% AV groove LCx and 30% mid RCA  . Cancer [facility]) 06/2012   prostate, larynx  . CHF (congestive heart failure) ([place])   . COPD (chronic obstructive pulmonary disease) ([place])   . CVA (cerebral infarction) 06/2009  . Elevated PSA 02/2012   6.54  . Hyperlipidemia    intolerant to statins  . Myocardial infarction ([place])   . PVD (peripheral vascular disease) ([place])    Aortobifemoral Bypass 2010, Thrombectomy and repair right brachial artery   . Radiation 08/10/2012-09/22/2012   6525 cGy to larynx  . Stroke ([place])   . Tobacco abuse   . Tubular adenoma   . Unstable angina [facility])     Past Surgical History:  Procedure Laterality Date  . Aortobifemoral bypass  2010  . COLONOSCOPY W/ BIOPSIES    . CORONARY ANGIOPLASTY WITH STENT PLACEMENT  11/2011  . larynx biopsy  07/12/12  . LEFT HEART CATHETERIZATION WITH CORONARY ANGIOGRAM N/A 12/17/2011   Procedure: LEFT HEART CATHETERIZATION WITH CORONARY ANGIOGRAM;  Surgeon: Hillary Bow, MD;  Location: [facility] CATH LAB;  Service: Cardiovascular;  Laterality: N/A;  . LEFT HEART CATHETERIZATION WITH CORONARY ANGIOGRAM N/A 09/04/2014   Procedure: LEFT  HEART CATHETERIZATION WITH CORONARY ANGIOGRAM;  Surgeon: Troy Sine, MD;  Location: [facility] CATH LAB;  Service: Cardiovascular;  Laterality: N/A;  . LOWER EXTREMITY ANGIOGRAPHY Left 09/14/2016   Procedure: Lower Extremity Angiography;  Surgeon: Lorretta Harp, MD;  Location: [facility] CV LAB;  Service: Cardiovascular;  Laterality: Left;  . PERCUTANEOUS CORONARY STENT INTERVENTION (PCI-S) N/A 12/17/2011   Procedure: PERCUTANEOUS CORONARY STENT INTERVENTION (PCI-S);  Surgeon: Hillary Bow, MD;  Location: [facility] CATH LAB;  Service: Cardiovascular;  Laterality: N/A;  . PROSTATE BIOPSY  07/12/12   Gleason's grade 3+3+6  . PROSTATECTOMY  12/2015  . 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  Subjective Assessment - 05/31/18 0947    Subjective  Reports neck soreness although his neck has been sore for over a month. Reports pain with B cervical rotation now.    Pertinent History  CVA; CAD; CHF; PVD; COPD and MI.    Diagnostic tests  X-ray.    Patient Stated Goals  Get out of pain.    Currently in Pain?  Yes    Pain Score  5     Pain Location  Neck    Pain Orientation  Left    Pain Descriptors / Indicators  Sore    Pain Type  Acute pain    Pain Onset  1 to 4 weeks ago    Pain Frequency  Intermittent         [facility] PT Assessment - 05/31/18 0001      Assessment   Medical Diagnosis  Cervicalgia.    Referring Provider (PT)  Claretta Fraise MD.    Onset Date/Surgical Date  05/02/18      Precautions   Precautions  None      Restrictions   Weight Bearing Restrictions  No                   [facility] Adult PT Treatment/Exercise - 05/31/18 0001      Modalities   Modalities  Electrical Stimulation;Moist Heat;Traction;Ultrasound      Moist Heat Therapy   Number Minutes Moist Heat  15 Minutes    Moist Heat Location  Cervical      Electrical Stimulation   Electrical  Stimulation Location  L UT    Electrical Stimulation Action  Pre-Mod    Electrical Stimulation Parameters  80-150 hz x15 min    Electrical Stimulation Goals  Tone;Pain      Ultrasound   Ultrasound Location  L UT    Ultrasound Parameters  Combo 1.5 w/cm2, 100%, 1 mhz x10 min    Ultrasound Goals  Pain      Traction   Type of Traction  Cervical    Min (lbs)  5    Max (lbs)  16    Hold Time  99    Rest Time  5    Time  15      Manual Therapy   Manual Therapy  Soft tissue mobilization    Soft tissue mobilization  STW to L UT to reduce tone and pain                  PT Long Term Goals - 05/11/18 1200      PT LONG TERM GOAL #1   Title  Independent with a HEP.    Time  6    Period  Weeks    Status  New      PT LONG TERM GOAL #2   Title  Increase active cervical rotation to 70 degrees+ so patient can turn head more easily while driving.    Time  6    Period  Weeks    Status  New      PT LONG TERM GOAL #3   Title  Perform ADL's with pain not > 2-3/10.    Time  6    Period  Weeks    Status  New      PT LONG TERM GOAL #4   Title  Eliminate left UE symptoms.    Time  6    Period  Weeks    Status  New            Plan - 05/31/18 1042    Clinical Impression Statement  Patient presented in clinic with continued reports of L shoulder soreness. Increased tightness of L UT palpable and patient very tender with manual therapy. Normal modalities response noted following removal of the modalities. No reports of any neck soreness or discomfort during the traction session. No pain in L shoulder during traction session. Only reports of soreness following end of treatment.    Rehab Potential  Excellent    PT Frequency  2x / week    PT Duration  6 weeks    PT Treatment/Interventions  ADLs/Self Care Home Management;Cryotherapy;Electrical Stimulation;Moist Heat;Traction;Ultrasound;Therapeutic exercise;Therapeutic activities;Patient/family education;Passive range of  motion;Manual techniques;Dry needling    PT Next Visit Plan  Continue with traction and conservative treatment per symptoms.    Consulted and Agree with Plan of Care  Patient       Patient will benefit from skilled therapeutic intervention in order to improve the following deficits and impairments:  Pain, Postural dysfunction, Decreased range of motion, Increased muscle spasms  Visit Diagnosis: Cervicalgia  Abnormal posture     Problem List Patient Active Problem List   Diagnosis Date Noted  . S/P prostatectomy 01/19/2017  . History of prostate cancer 01/19/2017  . PAD (peripheral artery disease) ([place]) 08/05/2016  . SUI (stress urinary incontinence), male 08/03/2016  . Thrombosis of aortobifemoral bypass graft ([place]) 01/28/2016  . History of stroke with residual deficit 01/03/2016  . Cardiomyopathy, ischemic 09/06/2014  . Skin lesion of scalp 06/23/2013  . Prostate cancer ([place]) 09/13/2012  . Squamous cell carcinoma of larynx ([place]) 09/13/2012  . Hyperlipidemia   . Tobacco abuse   . Unstable angina ([place]) 12/17/2011  . CAD (coronary artery disease) 12/27/2008    Standley Brooking, PTA 05/31/2018, 10:52 AM  [facility] [address] [place], [place], [address] Phone: [phone]   Fax:  [phone]  Name: [name] MRN: [id number] Date of Birth: 1951-06-12

## 2018-06-02 ENCOUNTER — Emergency Department (HOSPITAL_COMMUNITY): Payer: Medicare HMO

## 2018-06-02 ENCOUNTER — Encounter (HOSPITAL_COMMUNITY): Payer: Self-pay | Admitting: Emergency Medicine

## 2018-06-02 ENCOUNTER — Emergency Department (HOSPITAL_COMMUNITY)
Admission: EM | Admit: 2018-06-02 | Discharge: 2018-06-02 | Disposition: A | Discharge status: Home / Self Care | Payer: Medicare HMO | Attending: Emergency Medicine | Admitting: Emergency Medicine

## 2018-06-02 ENCOUNTER — Encounter: Payer: Self-pay | Admitting: Family Medicine

## 2018-06-02 ENCOUNTER — Ambulatory Visit (INDEPENDENT_AMBULATORY_CARE_PROVIDER_SITE_OTHER): Payer: Medicare HMO | Admitting: Family Medicine

## 2018-06-02 ENCOUNTER — Other Ambulatory Visit: Payer: Self-pay

## 2018-06-02 VITALS — BP 130/75 | HR 58 | Temp 97.0°F | Ht 69.0 in | Wt 211.2 lb

## 2018-06-02 DIAGNOSIS — J449 Chronic obstructive pulmonary disease, unspecified: Secondary | ICD-10-CM | POA: Insufficient documentation

## 2018-06-02 DIAGNOSIS — F17210 Nicotine dependence, cigarettes, uncomplicated: Secondary | ICD-10-CM | POA: Diagnosis not present

## 2018-06-02 DIAGNOSIS — I200 Unstable angina: Secondary | ICD-10-CM

## 2018-06-02 DIAGNOSIS — Z79899 Other long term (current) drug therapy: Secondary | ICD-10-CM | POA: Diagnosis not present

## 2018-06-02 DIAGNOSIS — I2510 Atherosclerotic heart disease of native coronary artery without angina pectoris: Secondary | ICD-10-CM | POA: Insufficient documentation

## 2018-06-02 DIAGNOSIS — R0789 Other chest pain: Secondary | ICD-10-CM | POA: Diagnosis not present

## 2018-06-02 DIAGNOSIS — R079 Chest pain, unspecified: Secondary | ICD-10-CM | POA: Insufficient documentation

## 2018-06-02 DIAGNOSIS — Z7982 Long term (current) use of aspirin: Secondary | ICD-10-CM | POA: Diagnosis not present

## 2018-06-02 DIAGNOSIS — R0602 Shortness of breath: Secondary | ICD-10-CM | POA: Diagnosis not present

## 2018-06-02 LAB — BASIC METABOLIC PANEL
Anion gap: 8 (ref 5–15)
BUN: 13 mg/dL (ref 8–23)
CO2: 24 mmol/L (ref 22–32)
Calcium: 9.1 mg/dL (ref 8.9–10.3)
Chloride: 107 mmol/L (ref 98–111)
Creatinine, Ser: 1.23 mg/dL (ref 0.61–1.24)
GFR calc Af Amer: >60 mL/min (ref >60)
GFR calc non Af Amer: >60 mL/min (ref >60)
Glucose, Bld: 86 mg/dL (ref 70–99)
POTASSIUM: 4 mmol/L (ref 3.5–5.1)
Sodium: 139 mmol/L (ref 135–145)

## 2018-06-02 LAB — CBC
HCT: 50.3 % (ref 39.0–52.0)
Hemoglobin: 16.8 g/dL (ref 13.0–17.0)
MCH: 30.9 pg (ref 26.0–34.0)
MCHC: 33.4 g/dL (ref 30.0–36.0)
MCV: 92.5 fL (ref 80.0–100.0)
NRBC: 0 % (ref 0.0–0.2)
Platelets: 219 10*3/uL (ref 150–400)
RBC: 5.44 MIL/uL (ref 4.22–5.81)
RDW: 13.4 % (ref 11.5–15.5)
WBC: 6.4 10*3/uL (ref 4.0–10.5)

## 2018-06-02 LAB — TROPONIN I: Troponin I: <0.03 ng/mL (ref <0.03)

## 2018-06-02 MED ORDER — NITROGLYCERIN 0.4 MG SL SUBL: 0.4000 mg | SUBLINGUAL_TABLET | SUBLINGUAL | 5 refills | Status: DC | PRN

## 2018-06-02 MED ORDER — ASPIRIN 325 MG PO TABS
325.0000 mg | ORAL_TABLET | Freq: Once | ORAL | Status: AC
  Administered 2018-06-02: 325 mg via ORAL
  Filled 2018-06-02: qty 1

## 2018-06-02 NOTE — ED Triage Notes (Signed)
Patient reports the onset of sharp chest pain this am, took 2 Nitro tabs. Patient went to his PCP who contacted his cardiologist. Told to come to hospital for observation. Patient denies pain at present. Reports EKG at PCP office was ok. Patient has cardiac history.

## 2018-06-02 NOTE — ED Provider Notes (Signed)
[facility] EMERGENCY DEPARTMENT Provider Note   CSN: [id number] Arrival date & time: 06/02/18  1630     History   Chief Complaint Chief Complaint  Patient presents with  . Chest Pain    HPI [name] is a 67 y.o. male.  HPI Patient presents with acute onset left-sided chest pain which he describes as sharp.  Occurred at 1 PM this afternoon while driving.  States he took 1 nitroglycerin and the sharp component of the pain resolved.  States he had pressure-like sensation which completely resolved after taking a second nitroglycerin.  Pain was completely resolved by 1:15 PM.  Says he has had no further chest pain since that time.  Went to see his primary physician who then contacted his cardiologist, Dr. Percival Spanish.  Was advised to come to the emergency department for evaluation.  Patient states he took 81 mg of aspirin today. Past Medical History:  Diagnosis Date  . CAD (coronary artery disease) 12/2008   a. cath 11/2011 s/p DES to LAD b. anterior STEMI 09/04/2014 cath DES to occluded prox LAD, 40% AV groove LCx and 30% mid RCA  . Cancer [facility]) 06/2012   prostate, larynx  . CHF (congestive heart failure) ([place])   . COPD (chronic obstructive pulmonary disease) ([place])   . CVA (cerebral infarction) 06/2009  . Elevated PSA 02/2012   6.54  . Hyperlipidemia    intolerant to statins  . Myocardial infarction ([place])   . PVD (peripheral vascular disease) ([place])    Aortobifemoral Bypass 2010, Thrombectomy and repair right brachial artery   . Radiation 08/10/2012-09/22/2012   6525 cGy to larynx  . Stroke ([place])   . Tobacco abuse   . Tubular adenoma   . Unstable angina [facility])     Patient Active Problem List   Diagnosis Date Noted  . S/P prostatectomy 01/19/2017  . History of prostate cancer 01/19/2017  . PAD (peripheral artery disease) ([place]) 08/05/2016  . SUI (stress urinary incontinence), male 08/03/2016  . Thrombosis of aortobifemoral bypass graft ([place]) 01/28/2016  . History of stroke with  residual deficit 01/03/2016  . Cardiomyopathy, ischemic 09/06/2014  . Skin lesion of scalp 06/23/2013  . Prostate cancer ([place]) 09/13/2012  . Squamous cell carcinoma of larynx ([place]) 09/13/2012  . Hyperlipidemia   . Tobacco abuse   . Unstable angina ([place]) 12/17/2011  . CAD (coronary artery disease) 12/27/2008    Past Surgical History:  Procedure Laterality Date  . Aortobifemoral bypass  2010  . COLONOSCOPY W/ BIOPSIES    . CORONARY ANGIOPLASTY WITH STENT PLACEMENT  11/2011  . larynx biopsy  07/12/12  . LEFT HEART CATHETERIZATION WITH CORONARY ANGIOGRAM N/A 12/17/2011   Procedure: LEFT HEART CATHETERIZATION WITH CORONARY ANGIOGRAM;  Surgeon: Hillary Bow, MD;  Location: [facility] CATH LAB;  Service: Cardiovascular;  Laterality: N/A;  . LEFT HEART CATHETERIZATION WITH CORONARY ANGIOGRAM N/A 09/04/2014   Procedure: LEFT HEART CATHETERIZATION WITH CORONARY ANGIOGRAM;  Surgeon: Troy Sine, MD;  Location: [facility] CATH LAB;  Service: Cardiovascular;  Laterality: N/A;  . LOWER EXTREMITY ANGIOGRAPHY Left 09/14/2016   Procedure: Lower Extremity Angiography;  Surgeon: Lorretta Harp, MD;  Location: [place] CV LAB;  Service: Cardiovascular;  Laterality: Left;  . PERCUTANEOUS CORONARY STENT INTERVENTION (PCI-S) N/A 12/17/2011   Procedure: PERCUTANEOUS CORONARY STENT INTERVENTION (PCI-S);  Surgeon: Hillary Bow, MD;  Location: [facility] CATH LAB;  Service: Cardiovascular;  Laterality: N/A;  . PROSTATE BIOPSY  07/12/12   Gleason's grade 3+3+6  . PROSTATECTOMY  12/2015  .  Thrombectomy and brachial artery repair     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        Home Medications    Prior to Admission medications   Medication Sig Start Date End Date Taking? Authorizing Provider  albuterol (PROAIR HFA) 108 (90 Base) MCG/ACT inhaler Inhale 1-2 puffs into the lungs every 6 (six) hours as needed for wheezing or shortness of breath.   Yes [provider]  aspirin EC 81 MG  tablet Take 1 tablet (81 mg total) by mouth daily. Patient taking differently: Take 81 mg by mouth every morning.  09/06/14  Yes Almyra Deforest, PA  betamethasone dipropionate (DIPROLENE) 0.05 % cream Apply to affected areas BID as needed for rash. 10/18/14  Yes [provider]  BRILINTA 90 MG TABS tablet TAKE 1 TABLET (90 MG TOTAL) BY MOUTH 2 (TWO) TIMES DAILY. Patient taking differently: Take 90 mg by mouth 2 (two) times daily.  04/12/18  Yes Lorretta Harp, MD  celecoxib (CELEBREX) 200 MG capsule Take 1 capsule (200 mg total) by mouth daily. With food Patient taking differently: Take 200 mg by mouth daily as needed for mild pain or moderate pain. With food 05/03/18  Yes Stacks, Cletus Gash, MD  cyclobenzaprine (FLEXERIL) 10 MG tablet Take 1 tablet (10 mg total) by mouth 3 (three) times daily as needed for muscle spasms. 05/03/18  Yes Stacks, Cletus Gash, MD  Evolocumab (REPATHA SURECLICK) 416 MG/ML SOAJ Inject 140 mg into the skin every 14 (fourteen) days. 11/03/17  Yes Minus Breeding, MD  ezetimibe (ZETIA) 10 MG tablet Take 1 tablet (10 mg total) by mouth daily. Patient taking differently: Take 5 mg by mouth every morning.  09/06/14  Yes Almyra Deforest, PA  fluocinonide cream (LIDEX) 6.06 % Apply 1 application topically 2 (two) times daily as needed.   Yes [provider]  isosorbide mononitrate (IMDUR) 30 MG 24 hr tablet Take 1 tablet (30 mg total) by mouth daily. 02/20/15  Yes Minus Breeding, MD  metoprolol tartrate (LOPRESSOR) 25 MG tablet Take 0.5 tablets (12.5 mg total) by mouth 2 (two) times daily. 09/06/14  Yes Almyra Deforest, PA  nitroGLYCERIN (NITROSTAT) 0.4 MG SL tablet Place 1 tablet (0.4 mg total) under the tongue every 5 (five) minutes as needed for chest pain (up to 3 doses). 06/02/18 07/30/20 Yes Dettinger, Fransisca Kaufmann, MD    Family History Family History  Problem Relation Age of Onset  . Coronary artery disease Father 56  . Heart disease Father   . Heart attack Father 53  . Coronary artery  disease Brother 60       died with AMI  . Heart disease Brother   . Breast cancer Mother   . Cancer Sister        uterine  . Stroke Maternal Grandmother   . Cancer Paternal Grandmother   . Healthy Daughter   . Healthy Son   . Heart attack Brother   . Healthy Son     Social History Social History   Tobacco Use  . Smoking status: Current Every Day Smoker    Packs/day: 0.50    Years: 51.00    Pack years: 25.50    Types: Cigarettes    Start date: 10/07/1967  . Smokeless tobacco: Never Used  Substance Use Topics  . Alcohol use: No    Alcohol/week: 0.0 standard drinks  . Drug use: No     Allergies   Contrast media [iodinated diagnostic agents]; Other; and Statins  Review of Systems Review of Systems  Constitutional: Negative for chills and fever.  HENT: Negative for sore throat.   Eyes: Negative for visual disturbance.  Respiratory: Negative for cough and shortness of breath.   Cardiovascular: Positive for chest pain. Negative for palpitations and leg swelling.  Gastrointestinal: Negative for abdominal pain, constipation, diarrhea, nausea and vomiting.  Genitourinary: Negative for dysuria, flank pain and frequency.  Musculoskeletal: Negative for back pain, myalgias and neck pain.  Skin: Negative for rash and wound.  Neurological: Negative for dizziness, weakness, light-headedness, numbness and headaches.  All other systems reviewed and are negative.    Physical Exam Updated Vital Signs BP (!) 130/96   Pulse (!) 51   Temp 98 F (36.7 C) (Oral)   Resp 18   Ht 5\' 9"  (1.753 m)   Wt 95.7 kg   SpO2 94%   BMI 31.16 kg/m   Physical Exam  Constitutional: He is oriented to person, place, and time. He appears well-developed and well-nourished. No distress.  HENT:  Head: Normocephalic and atraumatic.  Mouth/Throat: Oropharynx is clear and moist. No oropharyngeal exudate.  Eyes: Pupils are equal, round, and reactive to light. EOM are normal.  Neck: Normal range of  motion. Neck supple. No JVD present.  Cardiovascular: Normal rate and regular rhythm. Exam reveals no gallop and no friction rub.  No murmur heard. Pulmonary/Chest: Effort normal and breath sounds normal. No stridor. No respiratory distress. He has no wheezes. He has no rales.  Abdominal: Soft. Bowel sounds are normal. There is no tenderness. There is no rebound and no guarding.  Musculoskeletal: Normal range of motion. He exhibits no edema or tenderness.  No midline thoracic or lumbar tenderness.  No lower extremity swelling, asymmetry or tenderness.  Lymphadenopathy:    He has no cervical adenopathy.  Neurological: He is alert and oriented to person, place, and time.  Moves all extremities without focal deficit.  Sensation fully intact.  Skin: Skin is warm and dry. Capillary refill takes less than 2 seconds. No rash noted. He is not diaphoretic. No erythema.  Psychiatric: He has a normal mood and affect. His behavior is normal.  Nursing note and vitals reviewed.    ED Treatments / Results  Labs (all labs ordered are listed, but only abnormal results are displayed) Labs Reviewed  BASIC METABOLIC PANEL  CBC  TROPONIN I    EKG EKG Interpretation  Date/Time:  Thursday June 02 2018 16:47:19 EST Ventricular Rate:  57 PR Interval:  190 QRS Duration: 100 QT Interval:  442 QTC Calculation: 430 R Axis:   -44 Text Interpretation:  Sinus bradycardia Left axis deviation Low voltage QRS Incomplete right bundle branch block Cannot rule out Inferior infarct , age undetermined Cannot rule out Anterior infarct , age undetermined Abnormal ECG since last tracing no significant change Confirmed by Noemi Chapel [phone]) on 06/02/2018 4:56:53 PM   Radiology Dg Chest 2 View  Result Date: 06/02/2018 CLINICAL DATA:  Chest pain and shortness of breath and weakness. EXAM: CHEST - 2 VIEW COMPARISON:  09/08/2016 FINDINGS: Cardiomediastinal silhouette is normal. Mediastinal contours appear intact.  There is no evidence of focal airspace consolidation, pleural effusion or pneumothorax. Osseous structures are without acute abnormality. Soft tissues are grossly normal. IMPRESSION: No active cardiopulmonary disease. Electronically Signed   By: Fidela Salisbury M.D.   On: 06/02/2018 17:09    Procedures Procedures (including critical care time)  Medications Ordered in ED Medications  aspirin tablet 325 mg (has no administration in time range)  Initial Impression / Assessment and Plan / ED Course  I have reviewed the triage vital signs and the nursing notes.  Pertinent labs & imaging results that were available during my care of the patient were reviewed by me and considered in my medical decision making (see chart for details).     Spoke with Dr. Linda Hedges, cardiology fellow on-call peer review patient's EKG.  Advises have patient follow-up in the office.  Patient remains symptom-free.  Single normal troponin 7 hours after onset of symptoms is sufficient time to rule out acute MI.  Patient understands the need to return immediately for any worsening of his symptoms or concerns.   Final Clinical Impressions(s) / ED Diagnoses   Final diagnoses:  Nonspecific chest pain    ED Discharge Orders    None       Julianne Rice, MD 06/02/18 2046

## 2018-06-02 NOTE — Progress Notes (Signed)
BP 130/75   Pulse (!) 58   Temp (!) 97 F (36.1 C) (Oral)   Ht 5\' 9"  (1.753 m)   Wt 211 lb 3.2 oz (95.8 kg)   BMI 31.19 kg/m    Subjective:    Patient ID: [name], male    DOB: 06/25/1951, 67 y.o.   MRN: [id number]  HPI: [name] is a 67 y.o. male presenting on 06/02/2018 for Chest Pain (Patient states he had chest pain at 1pm and took a nitro at 1 and 1:15 and it went away.)   HPI Chest pain Patient comes in with complaints of chest pain.  He says he was driving to go get some gas from the gas station when he started having left-sided chest pain that was sharp and heavy in nature.  He had this about 3-1/2 hours ago and he immediately took one nitro and then another one 15 minutes later and the chest pain went away.  He did say that he felt clammy and sweaty and dizzy during the episode of chest pain.  He says that the chest pain did not radiate anywhere else.  He has not had any episodes of chest pain since then.  He does have a cardiologist Dr. Percival Spanish and it looks like he had a stress test 2 years ago and has had regular EKGs, as recently as March of this year with Dr. Percival Spanish.  He says this is the first time he has had to use his nitro in quite some time.  He thinks it may even be expired but he still took it because at all he had.  Relevant past medical, surgical, family and social history reviewed and updated as indicated. Interim medical history since our last visit reviewed. Allergies and medications reviewed and updated.  Review of Systems  Constitutional: Positive for fatigue. Negative for chills and fever.  Respiratory: Positive for chest tightness and shortness of breath. Negative for cough and wheezing.   Cardiovascular: Positive for chest pain. Negative for palpitations and leg swelling.  Endocrine: Positive for heat intolerance.  Musculoskeletal: Negative for back pain and gait problem.  Skin: Negative for rash.  Neurological: Positive for dizziness.  Negative for weakness, light-headedness, numbness and headaches.  All other systems reviewed and are negative.   Per HPI unless specifically indicated above   Allergies as of 06/02/2018      Reactions   Contrast Media [iodinated Diagnostic Agents]    Other Itching, Swelling, Dermatitis   IV dye   Statins Other (See Comments)   Muscle pain      Medication List        Accurate as of 06/02/18  3:27 PM. Always use your most recent med list.          aspirin EC 81 MG tablet Take 1 tablet (81 mg total) by mouth daily.   betamethasone dipropionate 0.05 % cream Commonly known as:  DIPROLENE Apply to affected areas BID as needed for rash.   BRILINTA 90 MG Tabs tablet Generic drug:  ticagrelor TAKE 1 TABLET (90 MG TOTAL) BY MOUTH 2 (TWO) TIMES DAILY.   celecoxib 200 MG capsule Commonly known as:  CELEBREX Take 1 capsule (200 mg total) by mouth daily. With food   cyclobenzaprine 10 MG tablet Commonly known as:  FLEXERIL Take 1 tablet (10 mg total) by mouth 3 (three) times daily as needed for muscle spasms.   Evolocumab 140 MG/ML Soaj Inject 140 mg into the skin every 14 (fourteen) days.  ezetimibe 10 MG tablet Commonly known as:  ZETIA Take 1 tablet (10 mg total) by mouth daily.   fluocinonide cream 0.05 % Commonly known as:  LIDEX Apply 1 application topically 2 (two) times daily as needed.   isosorbide mononitrate 30 MG 24 hr tablet Commonly known as:  IMDUR Take 1 tablet (30 mg total) by mouth daily.   metoprolol tartrate 25 MG tablet Commonly known as:  LOPRESSOR Take 0.5 tablets (12.5 mg total) by mouth 2 (two) times daily.   nitroGLYCERIN 0.4 MG SL tablet Commonly known as:  NITROSTAT Place 1 tablet (0.4 mg total) under the tongue every 5 (five) minutes as needed for chest pain (up to 3 doses).          Objective:    BP 130/75   Pulse (!) 58   Temp (!) 97 F (36.1 C) (Oral)   Ht 5\' 9"  (1.753 m)   Wt 211 lb 3.2 oz (95.8 kg)   BMI 31.19 kg/m     Wt Readings from Last 3 Encounters:  06/02/18 211 lb 3.2 oz (95.8 kg)  05/18/18 214 lb 12.8 oz (97.4 kg)  05/03/18 213 lb 9.6 oz (96.9 kg)    Physical Exam  Constitutional: He is oriented to person, place, and time. He appears well-developed and well-nourished. No distress.  Eyes: Pupils are equal, round, and reactive to light. Conjunctivae and EOM are normal. Right eye exhibits no discharge. No scleral icterus.  Neck: Neck supple. No thyromegaly present.  Cardiovascular: Normal rate, regular rhythm, normal heart sounds, intact distal pulses and normal pulses.  No murmur heard. Pulmonary/Chest: Effort normal and breath sounds normal. No respiratory distress. He has no wheezes.  Musculoskeletal: Normal range of motion. He exhibits no edema.  Lymphadenopathy:    He has no cervical adenopathy.  Neurological: He is alert and oriented to person, place, and time. Coordination normal.  Skin: Skin is warm and dry. No rash noted. He is not diaphoretic.  Psychiatric: He has a normal mood and affect. His behavior is normal.  Nursing note and vitals reviewed.   EKG: Rate of 62, low voltage EKG, partial right bundle branch block, no change from previous EKG in September 01, 2017    Assessment & Plan:   Problem List Items Addressed This Visit      Cardiovascular and Mediastinum   Unstable angina ([place]) - Primary   Relevant Medications   nitroGLYCERIN (NITROSTAT) 0.4 MG SL tablet   Other Relevant Orders   EKG 12-Lead (Completed)      Discussed with Dr. Percival Spanish and he recommended for the patient as well as I had to go to the emergency department for further evaluation and recommended for him to call and get an appointment with them in a week or 2 after leaving the hospital. Patient is agreeable towards going and wife will transport Follow up plan: Return if symptoms worsen or fail to improve.  Counseling provided for all of the vaccine components Orders Placed This Encounter  Procedures  .  EKG 12-Lead    Caryl Pina, MD [facility] 06/02/2018, 3:27 PM

## 2018-06-03 ENCOUNTER — Ambulatory Visit: Payer: Medicare HMO | Admitting: Physical Therapy

## 2018-06-03 ENCOUNTER — Encounter: Payer: Self-pay | Admitting: Physical Therapy

## 2018-06-03 DIAGNOSIS — R293 Abnormal posture: Secondary | ICD-10-CM

## 2018-06-03 DIAGNOSIS — M542 Cervicalgia: Secondary | ICD-10-CM | POA: Diagnosis not present

## 2018-06-03 NOTE — Therapy (Signed)
[name], [place], [address] Phone: [phone]   Fax:  [phone]  Physical Therapy Treatment  Patient Details  Name: [name] MRN: [id number] Date of Birth: 1951/02/05 Referring Provider (PT): Claretta Fraise MD.   Encounter Date: 06/03/2018  PT End of Session - 06/03/18 1042    Visit Number  6    Number of Visits  12    Date for PT Re-Evaluation  06/22/18    PT Start Time  0900    PT Stop Time  0954    PT Time Calculation (min)  54 min    Activity Tolerance  Patient tolerated treatment well    Behavior During Therapy  [facility] for tasks assessed/performed       Past Medical History:  Diagnosis Date  . CAD (coronary artery disease) 12/2008   a. cath 11/2011 s/p DES to LAD b. anterior STEMI 09/04/2014 cath DES to occluded prox LAD, 40% AV groove LCx and 30% mid RCA  . Cancer [facility]) 06/2012   prostate, larynx  . CHF (congestive heart failure) ([place])   . COPD (chronic obstructive pulmonary disease) ([place])   . CVA (cerebral infarction) 06/2009  . Elevated PSA 02/2012   6.54  . Hyperlipidemia    intolerant to statins  . Myocardial infarction ([place])   . PVD (peripheral vascular disease) ([place])    Aortobifemoral Bypass 2010, Thrombectomy and repair right brachial artery   . Radiation 08/10/2012-09/22/2012   6525 cGy to larynx  . Stroke ([place])   . Tobacco abuse   . Tubular adenoma   . Unstable angina [facility])     Past Surgical History:  Procedure Laterality Date  . Aortobifemoral bypass  2010  . COLONOSCOPY W/ BIOPSIES    . CORONARY ANGIOPLASTY WITH STENT PLACEMENT  11/2011  . larynx biopsy  07/12/12  . LEFT HEART CATHETERIZATION WITH CORONARY ANGIOGRAM N/A 12/17/2011   Procedure: LEFT HEART CATHETERIZATION WITH CORONARY ANGIOGRAM;  Surgeon: Hillary Bow, MD;  Location: [facility] CATH LAB;  Service: Cardiovascular;  Laterality: N/A;  . LEFT HEART CATHETERIZATION WITH CORONARY ANGIOGRAM N/A 09/04/2014   Procedure: LEFT  HEART CATHETERIZATION WITH CORONARY ANGIOGRAM;  Surgeon: Troy Sine, MD;  Location: [facility] C[facility] LAB;  Service: Cardiovascular;  Laterality: N/A;  . LOWER EXTREMITY ANGIOGRAPHY Left 09/14/2016   Procedure: Lower Extremity Angiography;  Surgeon: Lorretta Harp, MD;  Location: [place] CV LAB;  Service: Cardiovascular;  Laterality: Left;  . PERCUTANEOUS CORONARY STENT INTERVENTION (PCI-S) N/A 12/17/2011   Procedure: PERCUTANEOUS CORONARY STENT INTERVENTION (PCI-S);  Surgeon: Hillary Bow, MD;  Location: [facility] CATH LAB;  Service: Cardiovascular;  Laterality: N/A;  . PROSTATE BIOPSY  07/12/12   Gleason's grade 3+3+6  . PROSTATECTOMY  12/2015  . 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  Subjective Assessment - 06/03/18 1042    Subjective  The treatments are helping.  Pain lower in the morning and will increase by end of day.    Pertinent History  CVA; CAD; CHF; PVD; COPD and MI.    Diagnostic tests  X-ray.    Patient Stated Goals  Get out of pain.    Currently in Pain?  Yes    Pain Score  2     Pain Orientation  Left    Pain Type  Acute pain    Pain Onset  1 to 4  weeks ago                       [facility] Adult PT Treatment/Exercise - 06/03/18 0001      Electrical Stimulation   Electrical Stimulation Location  Left UT    Electrical Stimulation Action  Pre-mod.    Electrical Stimulation Parameters  80-150 Hz x 20 minutes.    Electrical Stimulation Goals  Tone;Pain      Ultrasound   Ultrasound Location  Left UT    Ultrasound Parameters  Combo e'stim/U/S at 1.50 W/CM2 x 8 minutes.    Ultrasound Goals  Pain      Traction   Type of Traction  Cervical    Min (lbs)  5    Max (lbs)  18    Hold Time  99    Rest Time  5    Time  15                  PT Long Term Goals - 05/11/18 1200      PT LONG TERM GOAL #1   Title  Independent with a HEP.     Time  6    Period  Weeks    Status  New      PT LONG TERM GOAL #2   Title  Increase active cervical rotation to 70 degrees+ so patient can turn head more easily while driving.    Time  6    Period  Weeks    Status  New      PT LONG TERM GOAL #3   Title  Perform ADL's with pain not > 2-3/10.    Time  6    Period  Weeks    Status  New      PT LONG TERM GOAL #4   Title  Eliminate left UE symptoms.    Time  6    Period  Weeks    Status  New            Plan - 06/03/18 1058    Clinical Impression Statement  Patient doing well with a low pain-level today.  He tolerated a 2# increase in traction today without complaint.      PT Treatment/Interventions  ADLs/Self Care Home Management;Cryotherapy;Electrical Stimulation;Moist Heat;Traction;Ultrasound;Therapeutic exercise;Therapeutic activities;Patient/family education;Passive range of motion;Manual techniques;Dry needling    PT Next Visit Plan  Continue with traction and conservative treatment per symptoms.    Consulted and Agree with Plan of Care  Patient       Patient will benefit from skilled therapeutic intervention in order to improve the following deficits and impairments:  Pain, Postural dysfunction, Decreased range of motion, Increased muscle spasms  Visit Diagnosis: Cervicalgia  Abnormal posture     Problem List Patient Active Problem List   Diagnosis Date Noted  . S/P prostatectomy 01/19/2017  . History of prostate cancer 01/19/2017  . PAD (peripheral artery disease) ([place]) 08/05/2016  . SUI (stress urinary incontinence), male 08/03/2016  . Thrombosis of aortobifemoral bypass graft ([place]) 01/28/2016  . History of stroke with residual deficit 01/03/2016  . Cardiomyopathy, ischemic 09/06/2014  . Skin lesion of scalp 06/23/2013  . Prostate cancer ([place]) 09/13/2012  . Squamous cell carcinoma of larynx ([place]) 09/13/2012  . Hyperlipidemia   . Tobacco abuse   . Unstable angina ([place]) 12/17/2011  . CAD (coronary  artery disease) 12/27/2008    Aliviana Burdell, [place] MPT 06/03/2018, 11:02 AM  [facility] [place]  [place], [place], [address] Phone: [phone]   Fax:  [phone]  Name: [name] MRN: [id number] Date of Birth: 05/25/1951

## 2018-06-04 NOTE — Progress Notes (Signed)
HPI The patient presents for follow up of CAD.  He was hospitalized in March of 2016 with an acute anterior myocardial infarction.  Catheterization demonstrated total occlusion of the LAD proximal to his previously placed stent. He had 30% RCA stenosis and 40% stenosis elsewhere. He was on Plavix at that time and seems to have failed that medication. He did have an ejection fraction of about 45% with anteroapical akinesis.  Since I last saw him he has had surgery for prostate cancer.  He has also seen Dr. Gwenlyn Found and Dr. Scot Dock for claudication.  He has had angiography and had aortobifemoral bypass graft with diffuse 70 - 80% proximal, mid and distal left SFA stenosis. He had 2 vessel runoff with an occluded peroneal.  He was not thought to be a candidate for PTA or redo surgery.  Last week he was in [place], Fransisca Kaufmann, MD's office and reported chest pain.  He was referred to the ED.  I reviewed these records for this visit.   Enzymes were negative and EKG demonstrated no acute changes.  He was discharged from the ED.    Since I last saw him he was in the emergency room on the fifth.  He had some chest discomfort for which he required a nitroglycerin.  It was a midsternal discomfort.  It did go away eventually after the nitroglycerin but because of his significant history was suggested that he go to the emergency room. I reviewed these records for this visit.  He had negative enzymes.  EKG was nonacute.  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  He will get short of breath and fatigued with this but this is baseline and not different than previous and he is not getting any chest discomfort with this type of activity.  He has not had to take any nitroglycerin other than this 1.  He has his baseline dyspnea but no PND or orthopnea.  Is not having any palpitations, presyncope or syncope.   Allergies  Allergen Reactions  . Contrast Media  [Iodinated Diagnostic Agents]   . Other Itching, Swelling and Dermatitis    IV dye  . Statins Other (See Comments)    Muscle pain    Current Outpatient Medications  Medication Sig Dispense Refill  . albuterol (PROAIR HFA) 108 (90 Base) MCG/ACT inhaler Inhale 1-2 puffs into the lungs every 6 (six) hours as needed for wheezing or shortness of breath.    Marland Kitchen aspirin EC 81 MG tablet Take 1 tablet (81 mg total) by mouth daily. (Patient taking differently: Take 81 mg by mouth every morning. )    . betamethasone dipropionate (DIPROLENE) 0.05 % cream Apply to affected areas BID as needed for rash.    . BRILINTA 90 MG TABS tablet TAKE 1 TABLET (90 MG TOTAL) BY MOUTH 2 (TWO) TIMES DAILY. (Patient taking differently: Take 90 mg by mouth 2 (two) times daily. ) 60 tablet 8  . Evolocumab (REPATHA SURECLICK) 629 MG/ML SOAJ Inject 140 mg into the skin every 14 (fourteen) days. 2 pen 12  . ezetimibe (ZETIA) 10 MG tablet Take 1 tablet (10 mg total) by mouth daily. (Patient taking differently: Take 5 mg by mouth every morning. ) 30 tablet 5  . fluocinonide cream (LIDEX) 4.76 % Apply 1 application topically 2 (two) times daily as needed.    . isosorbide mononitrate (IMDUR) 30 MG 24 hr tablet Take 1 tablet (30  mg total) by mouth daily. 30 tablet 11  . metoprolol tartrate (LOPRESSOR) 25 MG tablet Take 0.5 tablets (12.5 mg total) by mouth 2 (two) times daily. 30 tablet 5  . nitroGLYCERIN (NITROSTAT) 0.4 MG SL tablet Place 1 tablet (0.4 mg total) under the tongue every 5 (five) minutes as needed for chest pain (up to 3 doses). 25 tablet 5   No current facility-administered medications for this visit.     Past Medical History:  Diagnosis Date  . CAD (coronary artery disease) 12/2008   a. cath 11/2011 s/p DES to LAD b. anterior STEMI 09/04/2014 cath DES to occluded prox LAD, 40% AV groove LCx and 30% mid RCA  . Cancer [facility]) 06/2012   prostate, larynx  . CHF (congestive heart failure) ([place])   . COPD (chronic obstructive  pulmonary disease) ([place])   . CVA (cerebral infarction) 06/2009  . Elevated PSA 02/2012   6.54  . Hyperlipidemia    intolerant to statins  . Myocardial infarction ([place])   . PVD (peripheral vascular disease) ([place])    Aortobifemoral Bypass 2010, Thrombectomy and repair right brachial artery   . Radiation 08/10/2012-09/22/2012   6525 cGy to larynx  . Stroke ([place])   . Tobacco abuse   . Tubular adenoma   . Unstable angina [facility])     Past Surgical History:  Procedure Laterality Date  . Aortobifemoral bypass  2010  . COLONOSCOPY W/ BIOPSIES    . CORONARY ANGIOPLASTY WITH STENT PLACEMENT  11/2011  . larynx biopsy  07/12/12  . LEFT HEART CATHETERIZATION WITH CORONARY ANGIOGRAM N/A 12/17/2011   Procedure: LEFT HEART CATHETERIZATION WITH CORONARY ANGIOGRAM;  Surgeon: Hillary Bow, MD;  Location[facility] C[facility] LAB;  Service: Cardiovascular;  Laterality: N/A;  . LEFT HEART CATHETERIZATION WITH CORONARY ANGIOGRAM N/A 09/04/2014   Procedure: LEFT HEART CATHETERIZATION WITH CORONARY ANGIOGRAM;  Surgeon: Troy Sine, MD;  Location: [facility] CATH LAB;  Service: Cardiovascular;  Laterality: N/A;  . LOWER EXTREMITY ANGIOGRAPHY Left 09/14/2016   Procedure: Lower Extremity Angiography;  Surgeon: Lorretta Harp, MD;  Location: [place] CV LAB;  Service: Cardiovascular;  Laterality: Left;  . PERCUTANEOUS CORONARY STENT INTERVENTION (PCI-S) N/A 12/17/2011   Procedure: PERCUTANEOUS CORONARY STENT INTERVENTION (PCI-S);  Surgeon: Hillary Bow, MD;  Location: [facility] CATH LAB;  Service: Cardiovascular;  Laterality: N/A;  . PROSTATE BIOPSY  07/12/12   Gleason's grade 3+3+6  . PROSTATECTOMY  12/2015  . 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    PHYSICAL EXAM BP 120/82   Pulse 60   Ht 5\' 9"  (1.753 m)   Wt 211 lb (95.7 kg)   BMI 31.16 kg/m   GENERAL:  Well appearing NECK:  No  jugular venous distention, waveform within normal limits, carotid upstroke brisk and symmetric, no bruits, no thyromegaly LUNGS:  Clear to auscultation bilaterally CHEST:  Unremarkable HEART:  PMI not displaced or sustained,S1 and S2 within normal limits, no S3, no S4, no clicks, no rubs, no murmurs ABD:  Flat, positive bowel sounds normal in frequency in pitch, no bruits, no rebound, no guarding, no midline pulsatile mass, no hepatomegaly, no splenomegaly EXT:  2 plus pulses upper and diminished dorsalis pedis and posterior tibialis bilateral lower, no edema, no cyanosis no clubbing    EKG: Sinus rhythm, rate 57  axis within normal limits, intervals within normal  limits, old anteroseptal infarct. Low voltage.  06/02/18    ASSESSMENT AND PLAN   CAD -  He had a one-time episode of chest discomfort.  There was no objective evidence of ischemia.  He is not otherwise having any symptoms despite reasonable activity.  Given this I do not think further testing is suggested unless he has recurrent symptoms.  He and I discussed this and he would let me know if he has any symptoms going forward.   Hyperlipidemia -  He is truly statin intolerant.  He has been treated with Repatha.  He has an excellent lipid profile with an LDL of 34 and HDL of 45.  He will continue the meds as listed.   Tobacco abuse -  He unfortunately can't stop smoking .  However, I am proud of him for even thinking about this.  He has cut back.  He does not smoke his cigarettes down as low as he used to.   Ischemic cardiomyopathy - EF was low normal (50 - 55%) with regional wall motion abnormalities in 2017.  No change in therapy.  No further imaging.

## 2018-06-07 ENCOUNTER — Ambulatory Visit: Payer: Medicare HMO | Admitting: Physical Therapy

## 2018-06-07 DIAGNOSIS — M542 Cervicalgia: Secondary | ICD-10-CM | POA: Diagnosis not present

## 2018-06-07 DIAGNOSIS — R293 Abnormal posture: Secondary | ICD-10-CM

## 2018-06-07 NOTE — Therapy (Signed)
[facility] [place], [place], [address] Phone: [phone]   Fax:  [phone]  Physical Therapy Treatment  Patient Details  Name: [name] MRN: [id number] Date of Birth: 05/20/1951 Referring Provider (PT): Claretta Fraise MD.   Encounter Date: 06/07/2018  PT End of Session - 06/07/18 1151    Visit Number  7    Number of Visits  12    Date for PT Re-Evaluation  06/22/18    PT Start Time  0945    PT Stop Time  6440    PT Time Calculation (min)  58 min       Past Medical History:  Diagnosis Date  . CAD (coronary artery disease) 12/2008   a. cath 11/2011 s/p DES to LAD b. anterior STEMI 09/04/2014 cath DES to occluded prox LAD, 40% AV groove LCx and 30% mid RCA  . Cancer [facility]) 06/2012   prostate, larynx  . CHF (congestive heart failure) ([place])   . COPD (chronic obstructive pulmonary disease) ([place])   . CVA (cerebral infarction) 06/2009  . Elevated PSA 02/2012   6.54  . Hyperlipidemia    intolerant to statins  . Myocardial infarction ([place])   . PVD (peripheral vascular disease) ([place])    Aortobifemoral Bypass 2010, Thrombectomy and repair right brachial artery   . Radiation 08/10/2012-09/22/2012   6525 cGy to larynx  . Stroke ([place])   . Tobacco abuse   . Tubular adenoma   . Unstable angina [facility])     Past Surgical History:  Procedure Laterality Date  . Aortobifemoral bypass  2010  . COLONOSCOPY W/ BIOPSIES    . CORONARY ANGIOPLASTY WITH STENT PLACEMENT  11/2011  . larynx biopsy  07/12/12  . LEFT HEART CATHETERIZATION WITH CORONARY ANGIOGRAM N/A 12/17/2011   Procedure: LEFT HEART CATHETERIZATION WITH CORONARY ANGIOGRAM;  Surgeon: Hillary Bow, MD;  Location: [facility] CATH LAB;  Service: Cardiovascular;  Laterality: N/A;  . LEFT HEART CATHETERIZATION WITH CORONARY ANGIOGRAM N/A 09/04/2014   Procedure: LEFT HEART CATHETERIZATION WITH CORONARY ANGIOGRAM;  Surgeon: Troy Sine, MD;  Location: [facility] CATH LAB;  Service:  Cardiovascular;  Laterality: N/A;  . LOWER EXTREMITY ANGIOGRAPHY Left 09/14/2016   Procedure: Lower Extremity Angiography;  Surgeon: Lorretta Harp, MD;  Location: [place] CV LAB;  Service: Cardiovascular;  Laterality: Left;  . PERCUTANEOUS CORONARY STENT INTERVENTION (PCI-S) N/A 12/17/2011   Procedure: PERCUTANEOUS CORONARY STENT INTERVENTION (PCI-S);  Surgeon: Hillary Bow, MD;  Location: [facility] CATH LAB;  Service: Cardiovascular;  Laterality: N/A;  . PROSTATE BIOPSY  07/12/12   Gleason's grade 3+3+6  . PROSTATECTOMY  12/2015  . 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  Subjective Assessment - 06/07/18 1123    Subjective  I can tell the treatments are really start to help.    Pertinent History  CVA; CAD; CHF; PVD; COPD and MI.    Diagnostic tests  X-ray.    Patient Stated Goals  Get out of pain.    Currently in Pain?  Yes    Pain Score  2     Pain Orientation  Left    Pain Descriptors / Indicators  Sore    Pain Onset  1 to 4 weeks ago                       [facility] Adult PT  Treatment/Exercise - 06/07/18 0001      Modalities   Modalities  Electrical Stimulation;Moist Heat;Ultrasound      Moist Heat Therapy   Number Minutes Moist Heat  15 Minutes    Moist Heat Location  --   Left cervical.     Electrical Stimulation   Electrical Stimulation Location  Left UT    Electrical Stimulation Action  Pre-mod (5 sec on and 5 sec off).    Electrical Stimulation Parameters  80-150 Hz x 15 minutes.    Electrical Stimulation Goals  Tone;Pain      Ultrasound   Ultrasound Location  Left UT.    Ultrasound Parameters  Combo e'stim/U/S at 1.50 W/CM2 x 12 minutes.      Traction   Type of Traction  Cervical    Min (lbs)  5    Max (lbs)  18    Hold Time  99    Rest Time  5    Time  15                  PT Long Term Goals - 05/11/18 1200      PT  LONG TERM GOAL #1   Title  Independent with a HEP.    Time  6    Period  Weeks    Status  New      PT LONG TERM GOAL #2   Title  Increase active cervical rotation to 70 degrees+ so patient can turn head more easily while driving.    Time  6    Period  Weeks    Status  New      PT LONG TERM GOAL #3   Title  Perform ADL's with pain not > 2-3/10.    Time  6    Period  Weeks    Status  New      PT LONG TERM GOAL #4   Title  Eliminate left UE symptoms.    Time  6    Period  Weeks    Status  New            Plan - 06/07/18 1152    Clinical Impression Statement  Patient very pleased with his progress reporting less left sided neck pain and left UE symptoms.       Patient will benefit from skilled therapeutic intervention in order to improve the following deficits and impairments:     Visit Diagnosis: Cervicalgia  Abnormal posture     Problem List Patient Active Problem List   Diagnosis Date Noted  . S/P prostatectomy 01/19/2017  . History of prostate cancer 01/19/2017  . PAD (peripheral artery disease) ([place]) 08/05/2016  . SUI (stress urinary incontinence), male 08/03/2016  . Thrombosis of aortobifemoral bypass graft ([place]) 01/28/2016  . History of stroke with residual deficit 01/03/2016  . Cardiomyopathy, ischemic 09/06/2014  . Skin lesion of scalp 06/23/2013  . Prostate cancer ([place]) 09/13/2012  . Squamous cell carcinoma of larynx ([place]) 09/13/2012  . Hyperlipidemia   . Tobacco abuse   . Unstable angina ([place]) 12/17/2011  . CAD (coronary artery disease) 12/27/2008    Aileene Lanum, [place]  MPT 06/07/2018, 12:03 PM  [facility] [address] [place], [place], [address] Phone: [phone]   Fax:  [phone]  Name: [name] MRN: [id number] Date of Birth: 06-Feb-1951

## 2018-06-08 ENCOUNTER — Encounter: Payer: Self-pay | Admitting: Cardiology

## 2018-06-08 ENCOUNTER — Ambulatory Visit: Payer: Medicare HMO | Admitting: Cardiology

## 2018-06-08 VITALS — BP 120/82 | HR 60 | Ht 69.0 in | Wt 211.0 lb

## 2018-06-08 DIAGNOSIS — I739 Peripheral vascular disease, unspecified: Secondary | ICD-10-CM | POA: Diagnosis not present

## 2018-06-08 DIAGNOSIS — Z720 Tobacco use: Secondary | ICD-10-CM

## 2018-06-08 DIAGNOSIS — E785 Hyperlipidemia, unspecified: Secondary | ICD-10-CM

## 2018-06-08 DIAGNOSIS — I25119 Atherosclerotic heart disease of native coronary artery with unspecified angina pectoris: Secondary | ICD-10-CM | POA: Diagnosis not present

## 2018-06-08 NOTE — Patient Instructions (Signed)
Medication Instructions:  The current medical regimen is effective;  continue present plan and medications.  If you need a refill on your cardiac medications before your next appointment, please call your pharmacy.   Follow-Up: Follow up with Dr Percival Spanish in 6 months.  Thank you for choosing [facility]!!

## 2018-06-10 ENCOUNTER — Encounter: Payer: Self-pay | Admitting: Physical Therapy

## 2018-06-10 ENCOUNTER — Ambulatory Visit: Payer: Medicare HMO | Admitting: Physical Therapy

## 2018-06-10 DIAGNOSIS — R293 Abnormal posture: Secondary | ICD-10-CM | POA: Diagnosis not present

## 2018-06-10 DIAGNOSIS — M542 Cervicalgia: Secondary | ICD-10-CM | POA: Diagnosis not present

## 2018-06-10 NOTE — Therapy (Signed)
[facility] [place], [place], [address] Phone: [phone]   Fax:  [phone]  Physical Therapy Evaluation  Patient Details  Name: [name] MRN: [id number] Date of Birth: Sep 16, 1950 Referring Provider (PT): Claretta Fraise MD.   Encounter Date: 06/10/2018  PT End of Session - 06/10/18 1023    Visit Number  8    Number of Visits  12    Date for PT Re-Evaluation  06/22/18    PT Start Time  0945    PT Stop Time  1032    PT Time Calculation (min)  47 min    Activity Tolerance  Patient tolerated treatment well    Behavior During Therapy  [facility] for tasks assessed/performed       Past Medical History:  Diagnosis Date  . CAD (coronary artery disease) 12/2008   a. cath 11/2011 s/p DES to LAD b. anterior STEMI 09/04/2014 cath DES to occluded prox LAD, 40% AV groove LCx and 30% mid RCA  . Cancer [facility]) 06/2012   prostate, larynx  . CHF (congestive heart failure) ([place])   . COPD (chronic obstructive pulmonary disease) ([place])   . CVA (cerebral infarction) 06/2009  . Elevated PSA 02/2012   6.54  . Hyperlipidemia    intolerant to statins  . Myocardial infarction ([place])   . PVD (peripheral vascular disease) ([place])    Aortobifemoral Bypass 2010, Thrombectomy and repair right brachial artery   . Radiation 08/10/2012-09/22/2012   6525 cGy to larynx  . Stroke ([place])   . Tobacco abuse   . Tubular adenoma   . Unstable angina [facility])     Past Surgical History:  Procedure Laterality Date  . Aortobifemoral bypass  2010  . COLONOSCOPY W/ BIOPSIES    . CORONARY ANGIOPLASTY WITH STENT PLACEMENT  11/2011  . larynx biopsy  07/12/12  . LEFT HEART CATHETERIZATION WITH CORONARY ANGIOGRAM N/A 12/17/2011   Procedure: LEFT HEART CATHETERIZATION WITH CORONARY ANGIOGRAM;  Surgeon: Hillary Bow, MD;  Location: [facility] CATH LAB;  Service: Cardiovascular;  Laterality: N/A;  . LEFT HEART CATHETERIZATION WITH CORONARY ANGIOGRAM N/A 09/04/2014   Procedure: LEFT  HEART CATHETERIZATION WITH CORONARY ANGIOGRAM;  Surgeon: Troy Sine, MD;  Location: [facility] CATH LAB;  Service: Cardiovascular;  Laterality: N/A;  . LOWER EXTREMITY ANGIOGRAPHY Left 09/14/2016   Procedure: Lower Extremity Angiography;  Surgeon: Lorretta Harp, MD;  Location: [place] CV LAB;  Service: Cardiovascular;  Laterality: Left;  . PERCUTANEOUS CORONARY STENT INTERVENTION (PCI-S) N/A 12/17/2011   Procedure: PERCUTANEOUS CORONARY STENT INTERVENTION (PCI-S);  Surgeon: Hillary Bow, MD;  Location: [facility] CATH LAB;  Service: Cardiovascular;  Laterality: N/A;  . PROSTATE BIOPSY  07/12/12   Gleason's grade 3+3+6  . PROSTATECTOMY  12/2015  . 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                  Objective measurements completed on examination: See above findings.      [facility] Adult PT Treatment/Exercise - 06/10/18 0001      Modalities   Modalities  Electrical Stimulation;Traction      Moist Heat Therapy   Number Minutes Moist Heat  15 Minutes    Moist Heat Location  --   Left UT.     Hydrographic surveyor  Pre-mod (5 sec on and 5 sec off) x 15 minutes.    Electrical Stimulation Goals  Tone;Pain      Traction   Type of Traction  Cervical    Min (lbs)  5    Max (lbs)  18    Hold Time  99    Rest Time  5    Time  15      Manual Therapy   Manual Therapy  Soft tissue mobilization    Soft tissue mobilization  STW/M x 8 minutes to left UT to reduce tone.                  PT Long Term Goals - 05/11/18 1200      PT LONG TERM GOAL #1   Title  Independent with a HEP.    Time  6    Period  Weeks    Status  New      PT LONG TERM GOAL #2   Title  Increase active cervical rotation to 70 degrees+ so patient can turn head more easily while driving.    Time  6     Period  Weeks    Status  New      PT LONG TERM GOAL #3   Title  Perform ADL's with pain not > 2-3/10.    Time  6    Period  Weeks    Status  New      PT LONG TERM GOAL #4   Title  Eliminate left UE symptoms.    Time  6    Period  Weeks    Status  New             Plan - 06/10/18 1025    Clinical Impression Statement  Patient continues to reports a consistent reduction and in pain and symptoms.    PT Treatment/Interventions  ADLs/Self Care Home Management;Cryotherapy;Electrical Stimulation;Moist Heat;Traction;Ultrasound;Therapeutic exercise;Therapeutic activities;Patient/family education;Passive range of motion;Manual techniques;Dry needling    PT Next Visit Plan  Continue with traction and conservative treatment per symptoms.    Consulted and Agree with Plan of Care  Patient       Patient will benefit from skilled therapeutic intervention in order to improve the following deficits and impairments:  Pain, Postural dysfunction, Decreased range of motion, Increased muscle spasms  Visit Diagnosis: Cervicalgia  Abnormal posture     Problem List Patient Active Problem List   Diagnosis Date Noted  . S/P prostatectomy 01/19/2017  . History of prostate cancer 01/19/2017  . PAD (peripheral artery disease) ([place]) 08/05/2016  . SUI (stress urinary incontinence), male 08/03/2016  . Thrombosis of aortobifemoral bypass graft ([place]) 01/28/2016  . History of stroke with residual deficit 01/03/2016  . Cardiomyopathy, ischemic 09/06/2014  . Skin lesion of scalp 06/23/2013  . Prostate cancer ([place]) 09/13/2012  . Squamous cell carcinoma of larynx ([place]) 09/13/2012  . Hyperlipidemia   . Tobacco abuse   . Unstable angina ([place]) 12/17/2011  . CAD (coronary artery disease) 12/27/2008    Tamme Mozingo, [place] MPT 06/10/2018, 10:37 AM  [facility] [address] [place], [place], [address] Phone: [phone]   Fax:  [phone]  Name: [name] MRN: [id number] Date of Birth: [birth year]

## 2018-06-14 ENCOUNTER — Encounter: Payer: Self-pay | Admitting: Physical Therapy

## 2018-06-14 ENCOUNTER — Ambulatory Visit: Payer: Medicare HMO | Admitting: Physical Therapy

## 2018-06-14 DIAGNOSIS — M542 Cervicalgia: Secondary | ICD-10-CM

## 2018-06-14 DIAGNOSIS — R293 Abnormal posture: Secondary | ICD-10-CM

## 2018-06-14 NOTE — Therapy (Signed)
[facility] [place], [place], [address] Phone: [phone]   Fax:  [phone]  Physical Therapy Treatment  Patient Details  Name: [name] MRN: [id number] Date of Birth: 1951-06-08 Referring Provider (PT): Claretta Fraise MD.   Encounter Date: 06/14/2018  PT End of Session - 06/14/18 0946    Visit Number  9    Number of Visits  12    Date for PT Re-Evaluation  06/22/18    PT Start Time  0946    PT Stop Time  1042    PT Time Calculation (min)  56 min    Activity Tolerance  Patient tolerated treatment well    Behavior During Therapy  [facility] for tasks assessed/performed       Past Medical History:  Diagnosis Date  . CAD (coronary artery disease) 12/2008   a. cath 11/2011 s/p DES to LAD b. anterior STEMI 09/04/2014 cath DES to occluded prox LAD, 40% AV groove LCx and 30% mid RCA  . Cancer [facility]) 06/2012   prostate, larynx  . CHF (congestive heart failure) ([place])   . COPD (chronic obstructive pulmonary disease) ([place])   . CVA (cerebral infarction) 06/2009  . Elevated PSA 02/2012   6.54  . Hyperlipidemia    intolerant to statins  . Myocardial infarction ([place])   . PVD (peripheral vascular disease) ([place])    Aortobifemoral Bypass 2010, Thrombectomy and repair right brachial artery   . Radiation 08/10/2012-09/22/2012   6525 cGy to larynx  . Stroke ([place])   . Tobacco abuse   . Tubular adenoma   . Unstable angina [facility])     Past Surgical History:  Procedure Laterality Date  . Aortobifemoral bypass  2010  . COLONOSCOPY W/ BIOPSIES    . CORONARY ANGIOPLASTY WITH STENT PLACEMENT  11/2011  . larynx biopsy  07/12/12  . LEFT HEART CATHETERIZATION WITH CORONARY ANGIOGRAM N/A 12/17/2011   Procedure: LEFT HEART CATHETERIZATION WITH CORONARY ANGIOGRAM;  Surgeon: Hillary Bow, MD;  Location: [facility] CATH LAB;  Service: Cardiovascular;  Laterality: N/A;  . LEFT HEART CATHETERIZATION WITH CORONARY ANGIOGRAM N/A 09/04/2014   Procedure: LEFT  HEART CATHETERIZATION WITH CORONARY ANGIOGRAM;  Surgeon: Troy Sine, MD;  Location: [facility] CATH LAB;  Service: Cardiovascular;  Laterality: N/A;  . LOWER EXTREMITY ANGIOGRAPHY Left 09/14/2016   Procedure: Lower Extremity Angiography;  Surgeon: Lorretta Harp, MD;  Location: [place] CV LAB;  Service: Cardiovascular;  Laterality: Left;  . PERCUTANEOUS CORONARY STENT INTERVENTION (PCI-S) N/A 12/17/2011   Procedure: PERCUTANEOUS CORONARY STENT INTERVENTION (PCI-S);  Surgeon: Hillary Bow, MD;  Location: [facility] CATH LAB;  Service: Cardiovascular;  Laterality: N/A;  . PROSTATE BIOPSY  07/12/12   Gleason's grade 3+3+6  . PROSTATECTOMY  12/2015  . 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  Subjective Assessment - 06/14/18 0947    Subjective  Reports his neck is a little sore today but getting better. Reports getting up leaves in his yard yesterday.    Pertinent History  CVA; CAD; CHF; PVD; COPD and MI.    Diagnostic tests  X-ray.    Patient Stated Goals  Get out of pain.    Currently in Pain?  Yes    Pain Score  4     Pain Location  Neck    Pain Orientation  Left    Pain Descriptors / Indicators  Sore    Pain Type  Acute pain    Pain Onset  1 to 4 weeks ago    Pain Frequency  Intermittent         [facility] PT Assessment - 06/14/18 0001      Assessment   Medical Diagnosis  Cervicalgia.    Referring Provider (PT)  Claretta Fraise MD.    Onset Date/Surgical Date  05/02/18      Precautions   Precautions  None      Restrictions   Weight Bearing Restrictions  No                   [facility] Adult PT Treatment/Exercise - 06/14/18 0001      Modalities   Modalities  Electrical Stimulation;Moist Heat;Traction      Moist Heat Therapy   Number Minutes Moist Heat  15 Minutes    Moist Heat Location  Cervical      Electrical Stimulation   Electrical Stimulation Location   L UT    Electrical Stimulation Action  Pre-Mod    Electrical Stimulation Parameters  80-150 hz x15 min    Electrical Stimulation Goals  Tone;Pain      Traction   Type of Traction  Cervical    Min (lbs)  5    Max (lbs)  18    Hold Time  99    Rest Time  5    Time  15      Manual Therapy   Manual Therapy  Soft tissue mobilization    Soft tissue mobilization  STW/TPR to L UT to reduce pain and tone present                  PT Long Term Goals - 05/11/18 1200      PT LONG TERM GOAL #1   Title  Independent with a HEP.    Time  6    Period  Weeks    Status  New      PT LONG TERM GOAL #2   Title  Increase active cervical rotation to 70 degrees+ so patient can turn head more easily while driving.    Time  6    Period  Weeks    Status  New      PT LONG TERM GOAL #3   Title  Perform ADL's with pain not > 2-3/10.    Time  6    Period  Weeks    Status  New      PT LONG TERM GOAL #4   Title  Eliminate left UE symptoms.    Time  6    Period  Weeks    Status  New            Plan - 06/14/18 1028    Clinical Impression Statement  Patient presented in clinic with increased reports of L UT soreness today but he also reported getting up leaves yesterday at his home. Increased tone palpable in mid L UT with tenderness also reported by patient. Normal modalities response noted following removal of the modalities. Max traction increased to 20# max today with no complaints following end of treatment.    Rehab Potential  Excellent    PT Frequency  2x / week    PT Duration  6 weeks    PT Treatment/Interventions  ADLs/Self Care Home Management;Cryotherapy;Electrical Stimulation;Moist Heat;Traction;Ultrasound;Therapeutic exercise;Therapeutic activities;Patient/family education;Passive range of motion;Manual techniques;Dry needling    PT Next Visit Plan  Continue with traction and  conservative treatment per symptoms.    Consulted and Agree with Plan of Care  Patient        Patient will benefit from skilled therapeutic intervention in order to improve the following deficits and impairments:  Pain, Postural dysfunction, Decreased range of motion, Increased muscle spasms  Visit Diagnosis: Cervicalgia  Abnormal posture     Problem List Patient Active Problem List   Diagnosis Date Noted  . S/P prostatectomy 01/19/2017  . History of prostate cancer 01/19/2017  . PAD (peripheral artery disease) ([place]) 08/05/2016  . SUI (stress urinary incontinence), male 08/03/2016  . Thrombosis of aortobifemoral bypass graft ([place]) 01/28/2016  . History of stroke with residual deficit 01/03/2016  . Cardiomyopathy, ischemic 09/06/2014  . Skin lesion of scalp 06/23/2013  . Prostate cancer ([place]) 09/13/2012  . Squamous cell carcinoma of larynx ([place]) 09/13/2012  . Hyperlipidemia   . Tobacco abuse   . Unstable angina ([place]) 12/17/2011  . CAD (coronary artery disease) 12/27/2008    Standley Brooking, PTA 06/14/2018, 10:47 AM  [facility] [address] [place], [place], [address] Phone: [phone]   Fax:  [phone]  Name: [name] MRN: [id number] Date of Birth: Apr 16, 1951

## 2018-06-17 ENCOUNTER — Encounter: Payer: Medicare HMO | Admitting: Physical Therapy

## 2018-06-23 ENCOUNTER — Ambulatory Visit: Payer: Medicare HMO | Admitting: *Deleted

## 2018-06-23 DIAGNOSIS — M542 Cervicalgia: Secondary | ICD-10-CM

## 2018-06-23 DIAGNOSIS — R293 Abnormal posture: Secondary | ICD-10-CM

## 2018-06-23 NOTE — Therapy (Signed)
[facility] [place], [place], [address] Phone: [phone]   Fax:  [phone]  Physical Therapy Treatment  Patient Details  Name: [name] MRN: [id number] Date of Birth: [birth year] Referring Provider (PT): Claretta Fraise MD.   Encounter Date: 06/23/2018  PT End of Session - 06/23/18 0905    Visit Number  10    Number of Visits  12    Date for PT Re-Evaluation  06/22/18    PT Start Time  0900    PT Stop Time  0951    PT Time Calculation (min)  51 min       Past Medical History:  Diagnosis Date  . CAD (coronary artery disease) 12/2008   a. cath 11/2011 s/p DES to LAD b. anterior STEMI 09/04/2014 cath DES to occluded prox LAD, 40% AV groove LCx and 30% mid RCA  . Cancer [facility]) 06/2012   prostate, larynx  . CHF (congestive heart failure) ([place])   . COPD (chronic obstructive pulmonary disease) ([place])   . CVA (cerebral infarction) 06/2009  . Elevated PSA 02/2012   6.54  . Hyperlipidemia    intolerant to statins  . Myocardial infarction ([place])   . PVD (peripheral vascular disease) ([place])    Aortobifemoral Bypass 2010, Thrombectomy and repair right brachial artery   . Radiation 08/10/2012-09/22/2012   6525 cGy to larynx  . Stroke ([place])   . Tobacco abuse   . Tubular adenoma   . Unstable angina [facility])     Past Surgical History:  Procedure Laterality Date  . Aortobifemoral bypass  2010  . COLONOSCOPY W/ BIOPSIES    . CORONARY ANGIOPLASTY WITH STENT PLACEMENT  11/2011  . larynx biopsy  07/12/12  . LEFT HEART CATHETERIZATION WITH CORONARY ANGIOGRAM N/A 12/17/2011   Procedure: LEFT HEART CATHETERIZATION WITH CORONARY ANGIOGRAM;  Surgeon: Hillary Bow, MD;  Location: [facility] CATH LAB;  Service: Cardiovascular;  Laterality: N/A;  . LEFT HEART CATHETERIZATION WITH CORONARY ANGIOGRAM N/A 09/04/2014   Procedure: LEFT HEART CATHETERIZATION WITH CORONARY ANGIOGRAM;  Surgeon: Troy Sine, MD;  Location: [facility] CATH LAB;  Service:  Cardiovascular;  Laterality: N/A;  . LOWER EXTREMITY ANGIOGRAPHY Left 09/14/2016   Procedure: Lower Extremity Angiography;  Surgeon: Lorretta Harp, MD;  Location: [place] CV LAB;  Service: Cardiovascular;  Laterality: Left;  . PERCUTANEOUS CORONARY STENT INTERVENTION (PCI-S) N/A 12/17/2011   Procedure: PERCUTANEOUS CORONARY STENT INTERVENTION (PCI-S);  Surgeon: Hillary Bow, MD;  Location: [facility] CATH LAB;  Service: Cardiovascular;  Laterality: N/A;  . PROSTATE BIOPSY  07/12/12   Gleason's grade 3+3+6  . PROSTATECTOMY  12/2015  . 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  Subjective Assessment - 06/23/18 0901    Subjective  2-3/10 neck soreness today. Sore after last Rx, but did good    Pertinent History  CVA; CAD; CHF; PVD; COPD and MI.    Diagnostic tests  X-ray.    Patient Stated Goals  Get out of pain.    Currently in Pain?  Yes    Pain Score  3     Pain Location  Neck    Pain Orientation  Left    Pain Descriptors / Indicators  Sore    Pain Type  Acute pain    Pain Onset  1 to 4 weeks ago    Pain Frequency  Intermittent  [place] Adult PT Treatment/Exercise - 06/23/18 0001      Modalities   Modalities  Electrical Stimulation;Moist Heat;Traction      Moist Heat Therapy   Number Minutes Moist Heat  15 Minutes    Moist Heat Location  Cervical      Electrical Stimulation   Electrical Stimulation Location  L UT  Premod x 15 mins 80-'150hz'     Electrical Stimulation Action  sitting    Electrical Stimulation Goals  Tone;Pain      Traction   Type of Traction  Cervical    Min (lbs)  5    Max (lbs)  20    Hold Time  99    Rest Time  5    Time  15      Manual Therapy   Manual Therapy  Soft tissue mobilization    Soft tissue mobilization  STW/TPR to L UT to reduce pain and tone present                  PT Long  Term Goals - 06/23/18 4431      PT LONG TERM GOAL #1   Title  Independent with a HEP.    Time  6    Period  Weeks    Status  On-going      PT LONG TERM GOAL #2   Title  Increase active cervical rotation to 70 degrees+ so patient can turn head more easily while driving.    Time  6    Period  Weeks    Status  On-going      PT LONG TERM GOAL #3   Title  Perform ADL's with pain not > 2-3/10.    Time  6    Period  Weeks    Status  Partially Met      PT LONG TERM GOAL #4   Title  Eliminate left UE symptoms.    Time  6    Period  Weeks    Status  Partially Met            Plan - 06/23/18 0944    Clinical Impression Statement  Pt arrived today reporting doing fairly well after last Rx and with less pain today in LT UT/ neck. He had less tone in LT Utrap and responded well to TPR/STW. Traction was performed at 20#s without complaints. Normal modality response today. He has partially met 2 of the LTGs.    Clinical Presentation  Stable    Clinical Decision Making  Low    Rehab Potential  Excellent    PT Frequency  2x / week    PT Duration  6 weeks    PT Treatment/Interventions  ADLs/Self Care Home Management;Cryotherapy;Electrical Stimulation;Moist Heat;Traction;Ultrasound;Therapeutic exercise;Therapeutic activities;Patient/family education;Passive range of motion;Manual techniques;Dry needling    PT Next Visit Plan  Continue with traction and conservative treatment per symptoms.    Date ext. to complete 12 visits. 2 left    Consulted and Agree with Plan of Care  Patient       Patient will benefit from skilled therapeutic intervention in order to improve the following deficits and impairments:  Pain, Postural dysfunction, Decreased range of motion, Increased muscle spasms  Visit Diagnosis: Cervicalgia  Abnormal posture     Problem List Patient Active Problem List   Diagnosis Date Noted  . S/P prostatectomy 01/19/2017  . History of prostate cancer 01/19/2017  . PAD  (peripheral artery disease) ([place]) 08/05/2016  . SUI (stress urinary incontinence), male  08/03/2016  . Thrombosis of aortobifemoral bypass graft ([place]) 01/28/2016  . History of stroke with residual deficit 01/03/2016  . Cardiomyopathy, ischemic 09/06/2014  . Skin lesion of scalp 06/23/2013  . Prostate cancer ([place]) 09/13/2012  . Squamous cell carcinoma of larynx ([place]) 09/13/2012  . Hyperlipidemia   . Tobacco abuse   . Unstable angina ([place]) 12/17/2011  . CAD (coronary artery disease) 12/27/2008    Anvitha Hutmacher,CHRIS, PTA 06/23/2018, 10:11 AM  [facility] [place], [place], [address] Phone: [phone]   Fax:  [phone]  Name: Cortavius Montesinos MRN: [id number] Date of Birth: 1950-08-20

## 2018-06-28 ENCOUNTER — Encounter: Payer: Self-pay | Admitting: Physical Therapy

## 2018-06-28 ENCOUNTER — Ambulatory Visit: Payer: Medicare HMO | Admitting: Physical Therapy

## 2018-06-28 DIAGNOSIS — R293 Abnormal posture: Secondary | ICD-10-CM | POA: Diagnosis not present

## 2018-06-28 DIAGNOSIS — M542 Cervicalgia: Secondary | ICD-10-CM

## 2018-06-28 NOTE — Therapy (Signed)
[facility] [place], [place], [address] Phone: [phone]   Fax:  [phone]  Physical Therapy Treatment  Patient Details  Name: Demerius Podolak MRN: [id number] Date of Birth: [birth year] Referring Provider (PT): Claretta Fraise MD.   Encounter Date: 06/28/2018  PT End of Session - 06/28/18 0950    Visit Number  11    Number of Visits  12    Date for PT Re-Evaluation  07/06/18    PT Start Time  0950    PT Stop Time  1035    PT Time Calculation (min)  45 min    Activity Tolerance  Patient tolerated treatment well    Behavior During Therapy  [facility] for tasks assessed/performed       Past Medical History:  Diagnosis Date  . CAD (coronary artery disease) 12/2008   a. cath 11/2011 s/p DES to LAD b. anterior STEMI 09/04/2014 cath DES to occluded prox LAD, 40% AV groove LCx and 30% mid RCA  . Cancer [facility]) 06/2012   prostate, larynx  . CHF (congestive heart failure) ([place])   . COPD (chronic obstructive pulmonary disease) ([place])   . CVA (cerebral infarction) 06/2009  . Elevated PSA 02/2012   6.54  . Hyperlipidemia    intolerant to statins  . Myocardial infarction ([place])   . PVD (peripheral vascular disease) ([place])    Aortobifemoral Bypass 2010, Thrombectomy and repair right brachial artery   . Radiation 08/10/2012-09/22/2012   6525 cGy to larynx  . Stroke ([place])   . Tobacco abuse   . Tubular adenoma   . Unstable angina [facility])     Past Surgical History:  Procedure Laterality Date  . Aortobifemoral bypass  2010  . COLONOSCOPY W/ BIOPSIES    . CORONARY ANGIOPLASTY WITH STENT PLACEMENT  11/2011  . larynx biopsy  07/12/12  . LEFT HEART CATHETERIZATION WITH CORONARY ANGIOGRAM N/A 12/17/2011   Procedure: LEFT HEART CATHETERIZATION WITH CORONARY ANGIOGRAM;  Surgeon: Hillary Bow, MD;  Location[facility] CATH LAB;  Service: Cardiovascular;  Laterality: N/A;  . LEFT HEART CATHETERIZATION WITH CORONARY ANGIOGRAM N/A 09/04/2014   Procedure: LEFT  HEART CATHETERIZATION WITH CORONARY ANGIOGRAM;  Surgeon: Troy Sine, MD;  Location: [facility] CATH LAB;  Service: Cardiovascular;  Laterality: N/A;  . LOWER EXTREMITY ANGIOGRAPHY Left 09/14/2016   Procedure: Lower Extremity Angiography;  Surgeon: Lorretta Harp, MD;  Location: [place] CV LAB;  Service: Cardiovascular;  Laterality: Left;  . PERCUTANEOUS CORONARY STENT INTERVENTION (PCI-S) N/A 12/17/2011   Procedure: PERCUTANEOUS CORONARY STENT INTERVENTION (PCI-S);  Surgeon: Hillary Bow, MD;  Location: [facility] CATH LAB;  Service: Cardiovascular;  Laterality: N/A;  . PROSTATE BIOPSY  07/12/12   Gleason's grade 3+3+6  . PROSTATECTOMY  12/2015  . 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  Subjective Assessment - 06/28/18 0950    Subjective  Reports low level pain.    Pertinent History  CVA; CAD; CHF; PVD; COPD and MI.    Diagnostic tests  X-ray.    Patient Stated Goals  Get out of pain.    Currently in Pain?  Yes    Pain Score  3     Pain Location  Neck    Pain Orientation  Left    Pain Descriptors / Indicators  Other (Comment)   Stiffness   Pain Type  Acute pain  Pain Onset  1 to 4 weeks ago    Pain Frequency  Intermittent         [facility] PT Assessment - 06/28/18 0001      Assessment   Medical Diagnosis  Cervicalgia.    Referring Provider (PT)  Claretta Fraise MD.    Onset Date/Surgical Date  05/02/18      Precautions   Precautions  None      Restrictions   Weight Bearing Restrictions  No                   [facility] Adult PT Treatment/Exercise - 06/28/18 0001      Modalities   Modalities  Electrical Stimulation;Moist Heat;Traction      Moist Heat Therapy   Number Minutes Moist Heat  15 Minutes    Moist Heat Location  Cervical      Electrical Stimulation   Electrical Stimulation Location  L UT    Electrical Stimulation Action  Pre-Mod     Electrical Stimulation Parameters  80-150 hz x15 min    Electrical Stimulation Goals  Tone;Pain      Traction   Type of Traction  Cervical    Min (lbs)  5    Max (lbs)  20    Hold Time  99    Rest Time  5    Time  15      Manual Therapy   Manual Therapy  Soft tissue mobilization    Soft tissue mobilization  STW/TPR to L UT to reduce pain and tone present                  PT Long Term Goals - 06/23/18 3662      PT LONG TERM GOAL #1   Title  Independent with a HEP.    Time  6    Period  Weeks    Status  On-going      PT LONG TERM GOAL #2   Title  Increase active cervical rotation to 70 degrees+ so patient can turn head more easily while driving.    Time  6    Period  Weeks    Status  On-going      PT LONG TERM GOAL #3   Title  Perform ADL's with pain not > 2-3/10.    Time  6    Period  Weeks    Status  Partially Met      PT LONG TERM GOAL #4   Title  Eliminate left UE symptoms.    Time  6    Period  Weeks    Status  Partially Met            Plan - 06/28/18 1024    Clinical Impression Statement  Patient presented in clinic with continued stiffness in L UT region. Increased tightness palpable in L UT still at this time but reduces with pressure and manual therapy. Normal modalities response noted following end of modalities session. Traction max continued at 20# max.    Rehab Potential  Excellent    PT Frequency  2x / week    PT Duration  6 weeks    PT Treatment/Interventions  ADLs/Self Care Home Management;Cryotherapy;Electrical Stimulation;Moist Heat;Traction;Ultrasound;Therapeutic exercise;Therapeutic activities;Patient/family education;Passive range of motion;Manual techniques;Dry needling    PT Next Visit Plan  Continue with traction and conservative treatment per symptoms.       Consulted and Agree with Plan of Care  Patient       Patient  will benefit from skilled therapeutic intervention in order to improve the following deficits and  impairments:  Pain, Postural dysfunction, Decreased range of motion, Increased muscle spasms  Visit Diagnosis: Cervicalgia  Abnormal posture     Problem List Patient Active Problem List   Diagnosis Date Noted  . S/P prostatectomy 01/19/2017  . History of prostate cancer 01/19/2017  . PAD (peripheral artery disease) ([place]) 08/05/2016  . SUI (stress urinary incontinence), male 08/03/2016  . Thrombosis of aortobifemoral bypass graft ([place]) 01/28/2016  . History of stroke with residual deficit 01/03/2016  . Cardiomyopathy, ischemic 09/06/2014  . Skin lesion of scalp 06/23/2013  . Prostate cancer ([place]) 09/13/2012  . Squamous cell carcinoma of larynx ([place]) 09/13/2012  . Hyperlipidemia   . Tobacco abuse   . Unstable angina ([place]) 12/17/2011  . CAD (coronary artery disease) 12/27/2008    Standley Brooking, PTA 06/28/2018, 10:42 AM  [facility] [address] [place], [place], [address] Phone: [phone]   Fax:  [phone]  Name: Yago Ludvigsen MRN: [id number] Date of Birth: 1951/01/21

## 2018-06-30 ENCOUNTER — Ambulatory Visit: Payer: Medicare HMO | Attending: Family Medicine | Admitting: *Deleted

## 2018-06-30 DIAGNOSIS — R293 Abnormal posture: Secondary | ICD-10-CM | POA: Diagnosis not present

## 2018-06-30 DIAGNOSIS — M542 Cervicalgia: Secondary | ICD-10-CM | POA: Insufficient documentation

## 2018-06-30 NOTE — Therapy (Signed)
[facility] [place], [place], [address] Phone: [phone]   Fax:  [phone]  Physical Therapy Treatment  Patient Details  Name: [name] MRN: [id number] Date of Birth: [birth year] Referring Provider (PT): Claretta Fraise MD.   Encounter Date: 06/30/2018  PT End of Session - 06/30/18 1100    Visit Number  12    Number of Visits  12    Date for PT Re-Evaluation  07/06/18    PT Start Time  0945    PT Stop Time  2878    PT Time Calculation (min)  60 min       Past Medical History:  Diagnosis Date  . CAD (coronary artery disease) 12/2008   a. cath 11/2011 s/p DES to LAD b. anterior STEMI 09/04/2014 cath DES to occluded prox LAD, 40% AV groove LCx and 30% mid RCA  . Cancer [facility]) 06/2012   prostate, larynx  . CHF (congestive heart failure) ([place])   . COPD (chronic obstructive pulmonary disease) ([place])   . CVA (cerebral infarction) 06/2009  . Elevated PSA 02/2012   6.54  . Hyperlipidemia    intolerant to statins  . Myocardial infarction ([place])   . PVD (peripheral vascular disease) ([place])    Aortobifemoral Bypass 2010, Thrombectomy and repair right brachial artery   . Radiation 08/10/2012-09/22/2012   6525 cGy to larynx  . Stroke ([place])   . Tobacco abuse   . Tubular adenoma   . Unstable angina [facility]     Past Surgical History:  Procedure Laterality Date  . Aortobifemoral bypass  2010  . COLONOSCOPY W/ BIOPSIES    . CORONARY ANGIOPLASTY WITH STENT PLACEMENT  11/2011  . larynx biopsy  07/12/12  . LEFT HEART CATHETERIZATION WITH CORONARY ANGIOGRAM N/A 12/17/2011   Procedure: LEFT HEART CATHETERIZATION WITH CORONARY ANGIOGRAM;  Surgeon: Hillary Bow, MD;  Location: [facility] CATH LAB;  Service: Cardiovascular;  Laterality: N/A;  . LEFT HEART CATHETERIZATION WITH CORONARY ANGIOGRAM N/A 09/04/2014   Procedure: LEFT HEART CATHETERIZATION WITH CORONARY ANGIOGRAM;  Surgeon: Troy Sine, MD;  Location: [facility] CATH LAB;  Service:  Cardiovascular;  Laterality: N/A;  . LOWER EXTREMITY ANGIOGRAPHY Left 09/14/2016   Procedure: Lower Extremity Angiography;  Surgeon: Lorretta Harp, MD;  Location: [place] CV LAB;  Service: Cardiovascular;  Laterality: Left;  . PERCUTANEOUS CORONARY STENT INTERVENTION (PCI-S) N/A 12/17/2011   Procedure: PERCUTANEOUS CORONARY STENT INTERVENTION (PCI-S);  Surgeon: Hillary Bow, MD;  Location[facility] CATH LAB;  Service: Cardiovascular;  Laterality: N/A;  . PROSTATE BIOPSY  07/12/12   Gleason's grade 3+3+6  . PROSTATECTOMY  12/2015  . 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  Subjective Assessment - 06/30/18 1021    Subjective  Doing 70-80% better overall. DC today    Pertinent History  CVA; CAD; CHF; PVD; COPD and MI.    Diagnostic tests  X-ray.    Patient Stated Goals  Get out of pain.    Currently in Pain?  Yes    Pain Score  2     Pain Location  Neck    Pain Orientation  Left    Pain Type  Acute pain    Pain Onset  1 to 4 weeks ago    Pain Frequency  Intermittent  [place] Adult PT Treatment/Exercise - 06/30/18 0001      Modalities   Modalities  Electrical Stimulation;Moist Heat;Traction      Moist Heat Therapy   Number Minutes Moist Heat  15 Minutes    Moist Heat Location  Cervical      Electrical Stimulation   Electrical Stimulation Location  L UT  Premod x 15 mins 80-_0     Electrical Stimulation Goals  Tone;Pain      Ultrasound   Ultrasound Location  LT Utrap    Ultrasound Parameters   1.5 w/cm2 x 8 mins    Ultrasound Goals  Pain      Traction   Type of Traction  Cervical    Min (lbs)  5    Max (lbs)  20    Hold Time  99    Rest Time  5    Time  15      Manual Therapy   Manual Therapy  Soft tissue mobilization    Soft tissue mobilization  STW/TPR to L UT to reduce pain and tone present                   PT Long Term Goals - 06/30/18 1025      PT LONG TERM GOAL #1   Title  Independent with a HEP.    Period  Weeks    Status  Achieved      PT LONG TERM GOAL #2   Title  Increase active cervical rotation to 70 degrees+ so patient can turn head more easily while driving.    Time  6    Period  Weeks    Status  Achieved      PT LONG TERM GOAL #3   Title  Perform ADL's with pain not > 2-3/10.    Time  6    Period  Weeks    Status  Achieved      PT LONG TERM GOAL #4   Title  Eliminate left UE symptoms.    Time  6    Period  Weeks    Status  Achieved            Plan - 06/30/18 1026    Clinical Impression Statement  Pt arrived today doing fairly well and reports being 70-80% better with decreased pain. He did well with Rx today and was able to meet all LTGs. DC patient to HEP at this time.    Clinical Presentation  Stable    Rehab Potential  Excellent    PT Frequency  2x / week    PT Duration  6 weeks    PT Treatment/Interventions  ADLs/Self Care Home Management;Cryotherapy;Electrical Stimulation;Moist Heat;Traction;Ultrasound;Therapeutic exercise;Therapeutic activities;Patient/family education;Passive range of motion;Manual techniques;Dry needling    PT Next Visit Plan  DC to HEP    Consulted and Agree with Plan of Care  Patient       Patient will benefit from skilled therapeutic intervention in order to improve the following deficits and impairments:  Pain, Postural dysfunction, Decreased range of motion, Increased muscle spasms  Visit Diagnosis: Cervicalgia  Abnormal posture     Problem List Patient Active Problem List   Diagnosis Date Noted  . S/P prostatectomy 01/19/2017  . History of prostate cancer 01/19/2017  . PAD (peripheral artery disease) ([place]) 08/05/2016  . SUI (stress urinary incontinence), male 08/03/2016  . Thrombosis of aortobifemoral bypass graft ([place]) 01/28/2016  . History of stroke with residual deficit 01/03/2016  .  Cardiomyopathy, ischemic 09/06/2014  .  Skin lesion of scalp 06/23/2013  . Prostate cancer ([place]) 09/13/2012  . Squamous cell carcinoma of larynx ([place]) 09/13/2012  . Hyperlipidemia   . Tobacco abuse   . Unstable angina ([place]) 12/17/2011  . CAD (coronary artery disease) 12/27/2008    RAMSEUR,CHRIS , PTA 06/30/2018, 11:05 AM  [facility] [place], [place], [address] Phone: [phone]   Fax:  [phone]  Name: Arthor Gorter MRN: [id number] Date of Birth: Nov 11, 1950  PHYSICAL THERAPY DISCHARGE SUMMARY  Visits from Start of Care: 12.  Current functional level related to goals / functional outcomes: See above.   Remaining deficits: All goals met.   Education / Equipment: HEP. Plan: Patient agrees to discharge.  Patient goals were met. Patient is being discharged due to meeting the stated rehab goals.  ?????         Mali Applegate MPT

## 2018-07-12 DIAGNOSIS — C320 Malignant neoplasm of glottis: Secondary | ICD-10-CM | POA: Diagnosis not present

## 2018-07-13 ENCOUNTER — Encounter: Payer: Self-pay | Admitting: Family Medicine

## 2018-07-13 ENCOUNTER — Encounter (INDEPENDENT_AMBULATORY_CARE_PROVIDER_SITE_OTHER): Payer: Self-pay

## 2018-07-13 ENCOUNTER — Ambulatory Visit (INDEPENDENT_AMBULATORY_CARE_PROVIDER_SITE_OTHER): Payer: Medicare HMO | Admitting: Family Medicine

## 2018-07-13 VITALS — BP 113/72 | HR 54 | Temp 97.0°F | Ht 69.0 in | Wt 208.0 lb

## 2018-07-13 DIAGNOSIS — Z720 Tobacco use: Secondary | ICD-10-CM | POA: Diagnosis not present

## 2018-07-13 DIAGNOSIS — Z9079 Acquired absence of other genital organ(s): Secondary | ICD-10-CM

## 2018-07-13 DIAGNOSIS — E782 Mixed hyperlipidemia: Secondary | ICD-10-CM | POA: Diagnosis not present

## 2018-07-13 DIAGNOSIS — Z8546 Personal history of malignant neoplasm of prostate: Secondary | ICD-10-CM

## 2018-07-13 NOTE — Progress Notes (Signed)
BP 113/72   Pulse (!) 54   Temp (!) 97 F (36.1 C) (Oral)   Ht 5\' 9"  (1.753 m)   Wt 208 lb (94.3 kg)   BMI 30.72 kg/m    Subjective:    Patient ID: [name], male    DOB: 1950/10/24, 68 y.o.   MRN: [id number]  HPI: [name] is a 68 y.o. male presenting on 07/13/2018 for Hyperlipidemia (6 month follow up)   HPI Hyperlipidemia Patient is coming in for recheck of his hyperlipidemia. The patient is currently taking Brilinta and Repatha and Zetia. They deny any issues with myalgias or history of liver damage from it. They deny any focal numbness or weakness or chest pain.   Prostate cancer history Patient had prostate cancer where he had most of his prostate removed and does have some urinary incontinence because of this.  He has a urologist and he is going to see them wants to check his PSA today before going to see that.  Relevant past medical, surgical, family and social history reviewed and updated as indicated. Interim medical history since our last visit reviewed. Allergies and medications reviewed and updated.  Review of Systems  Constitutional: Negative for chills and fever.  Eyes: Negative for visual disturbance.  Respiratory: Negative for shortness of breath and wheezing.   Cardiovascular: Negative for chest pain and leg swelling.  Musculoskeletal: Negative for back pain and gait problem.  Skin: Negative for rash.  Neurological: Negative for dizziness, weakness, light-headedness and headaches.  All other systems reviewed and are negative.   Per HPI unless specifically indicated above   Allergies as of 07/13/2018      Reactions   Contrast Media [iodinated Diagnostic Agents]    Other Itching, Swelling, Dermatitis   IV dye   Statins Other (See Comments)   Muscle pain      Medication List       Accurate as of July 13, 2018 11:28 AM. Always use your most recent med list.        aspirin EC 81 MG tablet Take 1 tablet (81 mg total) by mouth  daily.   betamethasone dipropionate 0.05 % cream Commonly known as:  DIPROLENE Apply to affected areas BID as needed for rash.   BRILINTA 90 MG Tabs tablet Generic drug:  ticagrelor TAKE 1 TABLET (90 MG TOTAL) BY MOUTH 2 (TWO) TIMES DAILY.   Evolocumab 140 MG/ML Soaj Commonly known as:  REPATHA SURECLICK Inject 244 mg into the skin every 14 (fourteen) days.   ezetimibe 10 MG tablet Commonly known as:  ZETIA Take 1 tablet (10 mg total) by mouth daily.   fluocinonide cream 0.05 % Commonly known as:  LIDEX Apply 1 application topically 2 (two) times daily as needed.   isosorbide mononitrate 30 MG 24 hr tablet Commonly known as:  IMDUR Take 1 tablet (30 mg total) by mouth daily.   metoprolol tartrate 25 MG tablet Commonly known as:  LOPRESSOR Take 0.5 tablets (12.5 mg total) by mouth 2 (two) times daily.   nitroGLYCERIN 0.4 MG SL tablet Commonly known as:  NITROSTAT Place 1 tablet (0.4 mg total) under the tongue every 5 (five) minutes as needed for chest pain (up to 3 doses).   PROAIR HFA 108 (90 Base) MCG/ACT inhaler Generic drug:  albuterol Inhale 1-2 puffs into the lungs every 6 (six) hours as needed for wheezing or shortness of breath.          Objective:    BP 113/72  Pulse (!) 54   Temp (!) 97 F (36.1 C) (Oral)   Ht 5\' 9"  (1.753 m)   Wt 208 lb (94.3 kg)   BMI 30.72 kg/m   Wt Readings from Last 3 Encounters:  07/13/18 208 lb (94.3 kg)  06/08/18 211 lb (95.7 kg)  06/02/18 211 lb (95.7 kg)    Physical Exam Vitals signs and nursing note reviewed.  Constitutional:      General: He is not in acute distress.    Appearance: He is well-developed. He is not diaphoretic.  Eyes:     General: No scleral icterus.    Conjunctiva/sclera: Conjunctivae normal.  Neck:     Musculoskeletal: Neck supple.     Thyroid: No thyromegaly.  Cardiovascular:     Rate and Rhythm: Normal rate and regular rhythm.     Heart sounds: Normal heart sounds. No murmur.  Pulmonary:       Effort: Pulmonary effort is normal. No respiratory distress.     Breath sounds: Normal breath sounds. No wheezing.  Musculoskeletal: Normal range of motion.  Lymphadenopathy:     Cervical: No cervical adenopathy.  Skin:    General: Skin is warm and dry.     Findings: No rash.  Neurological:     Mental Status: He is alert and oriented to person, place, and time.     Coordination: Coordination normal.  Psychiatric:        Behavior: Behavior normal.     Results for orders placed or performed during the hospital encounter of 16/10/96  Basic metabolic panel  Result Value Ref Range   Sodium 139 135 - 145 mmol/L   Potassium 4.0 3.5 - 5.1 mmol/L   Chloride 107 98 - 111 mmol/L   CO2 24 22 - 32 mmol/L   Glucose, Bld 86 70 - 99 mg/dL   BUN 13 8 - 23 mg/dL   Creatinine, Ser 1.23 0.61 - 1.24 mg/dL   Calcium 9.1 8.9 - 10.3 mg/dL   GFR calc non Af Amer >60 >60 mL/min   GFR calc Af Amer >60 >60 mL/min   Anion gap 8 5 - 15  CBC  Result Value Ref Range   WBC 6.4 4.0 - 10.5 K/uL   RBC 5.44 4.22 - 5.81 MIL/uL   Hemoglobin 16.8 13.0 - 17.0 g/dL   HCT 50.3 39.0 - 52.0 %   MCV 92.5 80.0 - 100.0 fL   MCH 30.9 26.0 - 34.0 pg   MCHC 33.4 30.0 - 36.0 g/dL   RDW 13.4 11.5 - 15.5 %   Platelets 219 150 - 400 K/uL   nRBC 0.0 0.0 - 0.2 %  Troponin I - Add-On to previous collection  Result Value Ref Range   Troponin I <0.03 <0.03 ng/mL      Assessment & Plan:   Problem List Items Addressed This Visit      Other   Hyperlipidemia - Primary   Relevant Orders   Lipid panel   Tobacco abuse   S/P prostatectomy   Relevant Orders   PSA, total and free   History of prostate cancer   Relevant Orders   PSA, total and free      Patient seems like he is been stable with both his breathing and his CAD, we will check a PSA and he will follow-up with his urologist.  His throat cancer is been doing well as well. Follow up plan: Return in about 6 months (around 01/11/2019), or if symptoms worsen  or fail  to improve, for 6 months recheck cholesterol.  Counseling provided for all of the vaccine components Orders Placed This Encounter  Procedures  . PSA, total and free  . Lipid panel    Caryl Pina, MD [facility] 07/13/2018, 11:28 AM

## 2018-07-14 LAB — LIPID PANEL
CHOL/HDL RATIO: 2.9 ratio (ref 0.0–5.0)
Cholesterol, Total: 118 mg/dL (ref 100–199)
HDL: 41 mg/dL (ref >39)
LDL Calculated: 47 mg/dL (ref 0–99)
Triglycerides: 150 mg/dL — ABNORMAL HIGH (ref 0–149)
VLDL Cholesterol Cal: 30 mg/dL (ref 5–40)

## 2018-07-14 LAB — PSA, TOTAL AND FREE
PSA, Free: <0.02 ng/mL
Prostate Specific Ag, Serum: <0.1 ng/mL (ref 0.0–4.0)

## 2018-07-27 ENCOUNTER — Ambulatory Visit (INDEPENDENT_AMBULATORY_CARE_PROVIDER_SITE_OTHER): Payer: Medicare HMO

## 2018-07-27 ENCOUNTER — Ambulatory Visit (INDEPENDENT_AMBULATORY_CARE_PROVIDER_SITE_OTHER): Payer: Medicare HMO | Admitting: Family Medicine

## 2018-07-27 ENCOUNTER — Encounter: Payer: Self-pay | Admitting: Family Medicine

## 2018-07-27 VITALS — BP 119/73 | HR 58 | Temp 98.5°F | Ht 69.0 in | Wt 211.2 lb

## 2018-07-27 DIAGNOSIS — R1030 Lower abdominal pain, unspecified: Secondary | ICD-10-CM | POA: Diagnosis not present

## 2018-07-27 DIAGNOSIS — M1611 Unilateral primary osteoarthritis, right hip: Secondary | ICD-10-CM | POA: Diagnosis not present

## 2018-07-27 DIAGNOSIS — R1031 Right lower quadrant pain: Secondary | ICD-10-CM

## 2018-07-27 MED ORDER — PREDNISONE 20 MG PO TABS: ORAL_TABLET | ORAL | 0 refills | Status: DC

## 2018-07-27 NOTE — Progress Notes (Signed)
BP 119/73   Pulse (!) 58   Temp 98.5 F (36.9 C) (Oral)   Ht 5\' 9"  (1.753 m)   Wt 211 lb 3.2 oz (95.8 kg)   BMI 31.19 kg/m    Subjective:    Patient ID: [name], male    DOB: 1951/03/18, 68 y.o.   MRN: [id number]  HPI: [name] is a 68 y.o. male presenting on 07/27/2018 for Groin Pain (x 2-3 weeks)   HPI Right groin pain Patient is coming in with complaints of right groin pain.  He says is been hurting him over the past 2 to 3 weeks.  He says it hurts more when he gets up and starts moving initially and then it will improve but also hurts when he does prolonged episodes of walking or movement.  He says the pain is right there and that right anterior groin and does not radiate anywhere else.  He denies any pain in the muscles of his legs or weakness in that leg with walking.  He denies any fevers or chills or redness or warmth around the groin site itself.  He is concerned because he has had a bypass there for the blood flow of his legs.  He was also concerned because he had a hernia there before as well.  He denies any issues with his bowel movements or blood in the stool or constipation.  Relevant past medical, surgical, family and social history reviewed and updated as indicated. Interim medical history since our last visit reviewed. Allergies and medications reviewed and updated.  Review of Systems  Constitutional: Negative for chills and fever.  Respiratory: Negative for shortness of breath and wheezing.   Cardiovascular: Negative for chest pain and leg swelling.  Musculoskeletal: Positive for arthralgias. Negative for back pain, gait problem, joint swelling and myalgias.  Skin: Negative for color change and rash.  All other systems reviewed and are negative.   Per HPI unless specifically indicated above   Allergies as of 07/27/2018      Reactions   Contrast Media [iodinated Diagnostic Agents]    Other Itching, Swelling, Dermatitis   IV dye   Statins Other  (See Comments)   Muscle pain      Medication List       Accurate as of July 27, 2018  9:07 AM. Always use your most recent med list.        aspirin EC 81 MG tablet Take 1 tablet (81 mg total) by mouth daily.   betamethasone dipropionate 0.05 % cream Commonly known as:  DIPROLENE Apply to affected areas BID as needed for rash.   BRILINTA 90 MG Tabs tablet Generic drug:  ticagrelor TAKE 1 TABLET (90 MG TOTAL) BY MOUTH 2 (TWO) TIMES DAILY.   Evolocumab 140 MG/ML Soaj Commonly known as:  REPATHA SURECLICK Inject 599 mg into the skin every 14 (fourteen) days.   ezetimibe 10 MG tablet Commonly known as:  ZETIA Take 1 tablet (10 mg total) by mouth daily.   fluocinonide cream 0.05 % Commonly known as:  LIDEX Apply 1 application topically 2 (two) times daily as needed.   isosorbide mononitrate 30 MG 24 hr tablet Commonly known as:  IMDUR Take 1 tablet (30 mg total) by mouth daily.   metoprolol tartrate 25 MG tablet Commonly known as:  LOPRESSOR Take 0.5 tablets (12.5 mg total) by mouth 2 (two) times daily.   nitroGLYCERIN 0.4 MG SL tablet Commonly known as:  NITROSTAT Place 1 tablet (0.4 mg total)  under the tongue every 5 (five) minutes as needed for chest pain (up to 3 doses).   PROAIR HFA 108 (90 Base) MCG/ACT inhaler Generic drug:  albuterol Inhale 1-2 puffs into the lungs every 6 (six) hours as needed for wheezing or shortness of breath.          Objective:    BP 119/73   Pulse (!) 58   Temp 98.5 F (36.9 C) (Oral)   Ht 5\' 9"  (1.753 m)   Wt 211 lb 3.2 oz (95.8 kg)   BMI 31.19 kg/m   Wt Readings from Last 3 Encounters:  07/27/18 211 lb 3.2 oz (95.8 kg)  07/13/18 208 lb (94.3 kg)  06/08/18 211 lb (95.7 kg)    Physical Exam Vitals signs and nursing note reviewed.  Constitutional:      General: He is not in acute distress.    Appearance: He is well-developed. He is not diaphoretic.  Eyes:     General: No scleral icterus.    Conjunctiva/sclera:  Conjunctivae normal.  Neck:     Thyroid: No thyromegaly.  Cardiovascular:     Rate and Rhythm: Normal rate and regular rhythm.     Heart sounds: Normal heart sounds. No murmur.  Pulmonary:     Effort: Pulmonary effort is normal. No respiratory distress.     Breath sounds: Normal breath sounds. No wheezing.  Musculoskeletal: Normal range of motion.     Left hip: He exhibits tenderness. He exhibits normal range of motion, normal strength, no bony tenderness, no crepitus and no deformity.       Legs:  Skin:    General: Skin is warm and dry.     Findings: No rash.  Neurological:     Mental Status: He is alert and oriented to person, place, and time.     Coordination: Coordination normal.  Psychiatric:        Behavior: Behavior normal.     Right hip x-ray: Mild arthritis bilateral hips with some bone spurring    Assessment & Plan:   Problem List Items Addressed This Visit    None    Visit Diagnoses    Primary osteoarthritis of right hip    -  Primary   Relevant Medications   predniSONE (DELTASONE) 20 MG tablet   Other Relevant Orders   DG HIP UNILAT W OR W/O PELVIS 2-3 VIEWS RIGHT      Will treat with anti-inflammatories and if not improved and will go see orthopedic Follow up plan: Return if symptoms worsen or fail to improve.  Counseling provided for all of the vaccine components Orders Placed This Encounter  Procedures  . DG HIP UNILAT W OR W/O PELVIS 2-3 VIEWS RIGHT    Caryl Pina, MD [facility] 07/27/2018, 9:07 AM

## 2018-09-14 ENCOUNTER — Other Ambulatory Visit: Payer: Self-pay

## 2018-09-14 ENCOUNTER — Ambulatory Visit (INDEPENDENT_AMBULATORY_CARE_PROVIDER_SITE_OTHER): Payer: Medicare HMO

## 2018-09-14 ENCOUNTER — Ambulatory Visit (INDEPENDENT_AMBULATORY_CARE_PROVIDER_SITE_OTHER): Payer: Medicare HMO | Admitting: Family Medicine

## 2018-09-14 ENCOUNTER — Encounter: Payer: Self-pay | Admitting: Family Medicine

## 2018-09-14 VITALS — BP 129/72 | HR 70 | Temp 97.6°F | Ht 69.0 in | Wt 208.6 lb

## 2018-09-14 DIAGNOSIS — R05 Cough: Secondary | ICD-10-CM | POA: Diagnosis not present

## 2018-09-14 DIAGNOSIS — R6889 Other general symptoms and signs: Secondary | ICD-10-CM | POA: Diagnosis not present

## 2018-09-14 DIAGNOSIS — R0602 Shortness of breath: Secondary | ICD-10-CM

## 2018-09-14 DIAGNOSIS — J189 Pneumonia, unspecified organism: Secondary | ICD-10-CM

## 2018-09-14 LAB — VERITOR FLU A/B WAIVED
Influenza A: NEGATIVE
Influenza B: NEGATIVE

## 2018-09-14 MED ORDER — LEVOFLOXACIN 500 MG PO TABS: 500.0000 mg | ORAL_TABLET | Freq: Every day | ORAL | 0 refills | Status: DC

## 2018-09-14 NOTE — Progress Notes (Signed)
BP 129/72   Pulse 70   Temp 97.6 F (36.4 C) (Oral)   Ht 5\' 9"  (1.753 m)   Wt 208 lb 9.6 oz (94.6 kg)   SpO2 91%   BMI 30.80 kg/m    Subjective:    Patient ID: [name], male    DOB: [birth year], 68 y.o.   MRN: [id number]  HPI: [name] is a 68 y.o. male presenting on 09/14/2018 for Chills (x 2 days); Generalized Body Aches; Diarrhea; and Sore Throat   HPI Patient is coming in complaining of body aches and diarrhea Chills and wheezing is been going on over the past 3 months of this past couple months and he feels like similar to the lisinopril.  Patient denies any travel denies any contact with anybody who has coronavirus.  Patient denies any fevers but has had chills and body aches and generally is just not feeling well over the past couple days.  He is not taking anything over-the-counter.  He also admits that he has not used his albuterol inhaler because it makes his throat feels swollen and raw.  He feels like it is worsening residual intermittently today.  Relevant past medical, surgical, family and social history reviewed and updated as indicated. Interim medical history since our last visit reviewed. Allergies and medications reviewed and updated.  Review of Systems  Constitutional: Negative for chills and fever.  Eyes: Negative for visual disturbance.  Respiratory: Negative for shortness of breath and wheezing.   Cardiovascular: Negative for chest pain and leg swelling.  Musculoskeletal: Negative for back pain and gait problem.  Skin: Negative for rash.  Neurological: Negative for dizziness, weakness, light-headedness and numbness.  All other systems reviewed and are negative.   Per HPI unless specifically indicated above   Allergies as of 09/14/2018      Reactions   Contrast Media [iodinated Diagnostic Agents]    Other Itching, Swelling, Dermatitis   IV dye   Statins Other (See Comments)   Muscle pain      Medication List       Accurate as of  September 14, 2018  4:48 PM. Always use your most recent med list.        aspirin EC 81 MG tablet Take 1 tablet (81 mg total) by mouth daily.   betamethasone dipropionate 0.05 % cream Commonly known as:  DIPROLENE Apply to affected areas BID as needed for rash.   Brilinta 90 MG Tabs tablet Generic drug:  ticagrelor TAKE 1 TABLET (90 MG TOTAL) BY MOUTH 2 (TWO) TIMES DAILY.   Evolocumab 140 MG/ML Soaj Commonly known as:  Repatha SureClick Inject 462 mg into the skin every 14 (fourteen) days.   ezetimibe 10 MG tablet Commonly known as:  ZETIA Take 1 tablet (10 mg total) by mouth daily.   fluocinonide cream 0.05 % Commonly known as:  LIDEX Apply 1 application topically 2 (two) times daily as needed.   isosorbide mononitrate 30 MG 24 hr tablet Commonly known as:  IMDUR Take 1 tablet (30 mg total) by mouth daily.   levofloxacin 500 MG tablet Commonly known as:  LEVAQUIN Take 1 tablet (500 mg total) by mouth daily.   metoprolol tartrate 25 MG tablet Commonly known as:  LOPRESSOR Take 0.5 tablets (12.5 mg total) by mouth 2 (two) times daily.   nitroGLYCERIN 0.4 MG SL tablet Commonly known as:  NITROSTAT Place 1 tablet (0.4 mg total) under the tongue every 5 (five) minutes as needed for chest pain (up to  3 doses).   ProAir HFA 108 (90 Base) MCG/ACT inhaler Generic drug:  albuterol Inhale 1-2 puffs into the lungs every 6 (six) hours as needed for wheezing or shortness of breath.          Objective:    BP 129/72   Pulse 70   Temp 97.6 F (36.4 C) (Oral)   Ht 5\' 9"  (1.753 m)   Wt 208 lb 9.6 oz (94.6 kg)   SpO2 91%   BMI 30.80 kg/m   Wt Readings from Last 3 Encounters:  09/14/18 208 lb 9.6 oz (94.6 kg)  07/27/18 211 lb 3.2 oz (95.8 kg)  07/13/18 208 lb (94.3 kg)    Physical Exam Vitals signs and nursing note reviewed.  Constitutional:      General: He is not in acute distress.    Appearance: He is well-developed. He is not diaphoretic.  Eyes:     General: No  scleral icterus.    Conjunctiva/sclera: Conjunctivae normal.  Neck:     Musculoskeletal: Neck supple.     Thyroid: No thyromegaly.  Cardiovascular:     Rate and Rhythm: Normal rate and regular rhythm.     Heart sounds: Normal heart sounds. No murmur.  Pulmonary:     Effort: Pulmonary effort is normal. No respiratory distress.     Breath sounds: Wheezing, rhonchi and rales present.  Musculoskeletal: Normal range of motion.  Lymphadenopathy:     Cervical: No cervical adenopathy.  Skin:    General: Skin is warm and dry.     Findings: No rash.  Neurological:     Mental Status: He is alert and oriented to person, place, and time.     Coordination: Coordination normal.  Psychiatric:        Behavior: Behavior normal.     Chest x-ray: IMPRESSION: Negative for acute cardiopulmonary disease. Evidence of developing interstitial disease    Assessment & Plan:   Problem List Items Addressed This Visit    None    Visit Diagnoses    Atypical pneumonia    -  Primary   Flu-like symptoms       Relevant Medications   levofloxacin (LEVAQUIN) 500 MG tablet   Other Relevant Orders   Veritor Flu A/B Waived   DG Chest 2 View (Completed)   Shortness of breath       Relevant Medications   levofloxacin (LEVAQUIN) 500 MG tablet   Other Relevant Orders   DG Chest 2 View (Completed)      Concern for possible atypical pneumonia, will send an antibiotic and recommend inhalers and return as needed or if worsens, patient has no travel so no contacts with Covid 19, highly unlikely but recommended if worsens go to the hospital Follow up plan: Return if symptoms worsen or fail to improve.  Counseling provided for all of the vaccine components Orders Placed This Encounter  Procedures  . DG Chest 2 View  . Veritor Flu A/B [place], MD [facility] 09/14/2018, 4:48 PM

## 2018-10-31 ENCOUNTER — Ambulatory Visit (INDEPENDENT_AMBULATORY_CARE_PROVIDER_SITE_OTHER): Payer: Medicare HMO | Admitting: Family Medicine

## 2018-10-31 ENCOUNTER — Other Ambulatory Visit: Payer: Self-pay

## 2018-10-31 ENCOUNTER — Encounter: Payer: Self-pay | Admitting: Family Medicine

## 2018-10-31 VITALS — BP 115/71 | HR 60 | Temp 97.3°F | Ht 63.0 in | Wt 207.0 lb

## 2018-10-31 DIAGNOSIS — Z716 Tobacco abuse counseling: Secondary | ICD-10-CM

## 2018-10-31 MED ORDER — VARENICLINE TARTRATE 0.5 MG X 11 & 1 MG X 42 PO MISC: ORAL | 0 refills | Status: DC

## 2018-10-31 MED ORDER — VARENICLINE TARTRATE 1 MG PO TABS: 1.0000 mg | ORAL_TABLET | Freq: Two times a day (BID) | ORAL | 2 refills | Status: DC

## 2018-10-31 NOTE — Progress Notes (Signed)
BP 115/71   Pulse 60   Temp (!) 97.3 F (36.3 C) (Oral)   Ht 5\' 3"  (1.6 m)   Wt 207 lb (93.9 kg)   BMI 36.67 kg/m    Subjective:   Patient ID: [name], male    DOB: 05/17/1951, 68 y.o.   MRN: [id number]  HPI: De Jaworski is a 68 y.o. male presenting on 10/31/2018 for wants to stop smoking (chantix)   HPI Smoking cessation counseled. Currently smokes 1 pack/day and would like to quit smoking, he says he is increasing enough to smoking because he is at home with the coronavirus and not able to go out and that leads to more smoking just because he is bored.  He would like to try Chantix and he has tried it many years in the past and would like to try again.  He does have COPD and that is a big instigator is why he wants to quit smoking.  Relevant past medical, surgical, family and social history reviewed and updated as indicated. Interim medical history since our last visit reviewed. Allergies and medications reviewed and updated.  Review of Systems  Constitutional: Negative for chills and fever.  Eyes: Negative for visual disturbance.  Respiratory: Positive for cough. Negative for shortness of breath and wheezing.   Cardiovascular: Negative for chest pain and leg swelling.  Musculoskeletal: Negative for back pain and gait problem.  Skin: Negative for rash.  Neurological: Negative for dizziness, weakness and light-headedness.  All other systems reviewed and are negative.   Per HPI unless specifically indicated above   Allergies as of 10/31/2018      Reactions   Contrast Media [iodinated Diagnostic Agents]    Other Itching, Swelling, Dermatitis   IV dye   Statins Other (See Comments)   Muscle pain      Medication List       Accurate as of Oct 31, 2018  3:47 PM. Always use your most recent med list.        aspirin EC 81 MG tablet Take 1 tablet (81 mg total) by mouth daily.   betamethasone dipropionate 0.05 % cream Commonly known as:  DIPROLENE Apply to  affected areas BID as needed for rash.   Brilinta 90 MG Tabs tablet Generic drug:  ticagrelor TAKE 1 TABLET (90 MG TOTAL) BY MOUTH 2 (TWO) TIMES DAILY.   Evolocumab 140 MG/ML Soaj Commonly known as:  Repatha SureClick Inject 086 mg into the skin every 14 (fourteen) days.   ezetimibe 10 MG tablet Commonly known as:  ZETIA Take 1 tablet (10 mg total) by mouth daily.   fluocinonide cream 0.05 % Commonly known as:  LIDEX Apply 1 application topically 2 (two) times daily as needed.   isosorbide mononitrate 30 MG 24 hr tablet Commonly known as:  IMDUR Take 1 tablet (30 mg total) by mouth daily.   metoprolol tartrate 25 MG tablet Commonly known as:  LOPRESSOR Take 0.5 tablets (12.5 mg total) by mouth 2 (two) times daily.   nitroGLYCERIN 0.4 MG SL tablet Commonly known as:  NITROSTAT Place 1 tablet (0.4 mg total) under the tongue every 5 (five) minutes as needed for chest pain (up to 3 doses).   ProAir HFA 108 (90 Base) MCG/ACT inhaler Generic drug:  albuterol Inhale 1-2 puffs into the lungs every 6 (six) hours as needed for wheezing or shortness of breath.   varenicline 0.5 MG X 11 & 1 MG X 42 tablet Commonly known as:  Chantix Starting Month  Pak Take one 0.5 mg tablet by mouth daily for 3 days, then one 0.5 mg tablet twice daily for 4 days, then one 1 mg tablet twice daily.   varenicline 1 MG tablet Commonly known as:  Chantix Continuing Month Pak Take 1 tablet (1 mg total) by mouth 2 (two) times daily.        Objective:   BP 115/71   Pulse 60   Temp (!) 97.3 F (36.3 C) (Oral)   Ht 5\' 3"  (1.6 m)   Wt 207 lb (93.9 kg)   BMI 36.67 kg/m   Wt Readings from Last 3 Encounters:  10/31/18 207 lb (93.9 kg)  09/14/18 208 lb 9.6 oz (94.6 kg)  07/27/18 211 lb 3.2 oz (95.8 kg)    Physical Exam Vitals signs and nursing note reviewed.  Constitutional:      General: He is not in acute distress.    Appearance: He is well-developed. He is not diaphoretic.  Eyes:      General: No scleral icterus.    Conjunctiva/sclera: Conjunctivae normal.  Neck:     Musculoskeletal: Neck supple.     Thyroid: No thyromegaly.  Cardiovascular:     Rate and Rhythm: Normal rate and regular rhythm.     Heart sounds: Normal heart sounds. No murmur.  Pulmonary:     Effort: Pulmonary effort is normal. No respiratory distress.     Breath sounds: Normal breath sounds. No wheezing.  Musculoskeletal: Normal range of motion.  Lymphadenopathy:     Cervical: No cervical adenopathy.  Skin:    General: Skin is warm and dry.     Findings: No rash.  Neurological:     Mental Status: He is alert and oriented to person, place, and time.     Coordination: Coordination normal.  Psychiatric:        Behavior: Behavior normal.       Assessment & Plan:   Problem List Items Addressed This Visit    None    Visit Diagnoses    Encounter for smoking cessation counseling    -  Primary   Relevant Medications   varenicline (CHANTIX STARTING MONTH PAK) 0.5 MG X 11 & 1 MG X 42 tablet   varenicline (CHANTIX CONTINUING MONTH PAK) 1 MG tablet       Follow up plan: Return if symptoms worsen or fail to improve.  Counseling provided for all of the vaccine components No orders of the defined types were placed in this encounter.   Caryl Pina, MD [facility] 10/31/2018, 3:47 PM

## 2018-11-07 ENCOUNTER — Other Ambulatory Visit: Payer: Self-pay | Admitting: Cardiology

## 2018-11-15 DIAGNOSIS — C320 Malignant neoplasm of glottis: Secondary | ICD-10-CM | POA: Diagnosis not present

## 2018-12-01 ENCOUNTER — Telehealth: Payer: Self-pay | Admitting: *Deleted

## 2018-12-01 NOTE — Telephone Encounter (Signed)
Call placed to pt to switch his appt with Dr. Percival Spanish 12/07/2018 to a virtual and to get consent. Left a message for pt to call back.

## 2018-12-02 NOTE — Telephone Encounter (Signed)
..   Virtual Visit Pre-Appointment Phone Call  "(Name), I am calling you today to discuss your upcoming appointment. We are currently trying to limit exposure to the virus that causes COVID-19 by seeing patients at home rather than in the office."  1. "What is the BEST phone number to call the day of the visit?" - include this in appointment notes  2. Do you have or have access to (through a family member/friend) a smartphone with video capability that we can use for your visit?" a. If yes - list this number in appt notes as cell (if different from BEST phone #) and list the appointment type as a VIDEO visit in appointment notes b. If no - list the appointment type as a PHONE visit in appointment notes  3. Confirm consent - "In the setting of the current Covid19 crisis, you are scheduled for a (phone or video) visit with your provider on (date) at (time).  Just as we do with many in-office visits, in order for you to participate in this visit, we must obtain consent.  If you'd like, I can send this to your mychart (if signed up) or email for you to review.  Otherwise, I can obtain your verbal consent now.  All virtual visits are billed to your insurance company just like a normal visit would be.  By agreeing to a virtual visit, we'd like you to understand that the technology does not allow for your provider to perform an examination, and thus may limit your provider's ability to fully assess your condition. If your provider identifies any concerns that need to be evaluated in person, we will make arrangements to do so.  Finally, though the technology is pretty good, we cannot assure that it will always work on either your or our end, and in the setting of a video visit, we may have to convert it to a phone-only visit.  In either situation, we cannot ensure that we have a secure connection.  Are you willing to proceed?" STAFF: Did the patient verbally acknowledge consent to telehealth visit? Document  YES/NO here: YES  4. Advise patient to be prepared - "Two hours prior to your appointment, go ahead and check your blood pressure, pulse, oxygen saturation, and your weight (if you have the equipment to check those) and write them all down. When your visit starts, your provider will ask you for this information. If you have an Apple Watch or Kardia device, please plan to have heart rate information ready on the day of your appointment. Please have a pen and paper handy nearby the day of the visit as well."--Pt aware  5. Give patient instructions for MyChart download to smartphone OR Doximity/Doxy.me as below if video visit (depending on what platform provider is using)--Neither pt nor his wife have a smartphone, therefore will do a telephone visit  6. Inform patient they will receive a phone call 15 minutes prior to their appointment time (may be from unknown caller ID) so they should be prepared to answer--Pt aware    TELEPHONE CALL NOTE  Zaine Elsass has been deemed a candidate for a follow-up tele-health visit to limit community exposure during the Covid-19 pandemic. I spoke with the patient via phone to ensure availability of phone/video source, confirm preferred email & phone number, and discuss instructions and expectations.  I reminded Krikor Willet to be prepared with any vital sign and/or heart rhythm information that could potentially be obtained via home monitoring, at the time of  his visit. I reminded Bell Cai to expect a phone call prior to his visit.  Juventino Slovak, CMA 12/02/2018 3:04 PM   INSTRUCTIONS FOR DOWNLOADING THE MYCHART APP TO SMARTPHONE  - The patient must first make sure to have activated MyChart and know their login information - If Apple, go to [employer] and type in MyChart in the search bar and download the app. If Android, ask patient to go to [employer] and type in [place] in the search bar and download the app. The app is free but as with any  other app downloads, their phone may require them to verify saved payment information or Apple/Android password.  - The patient will need to then log into the app with their MyChart username and password, and select [facility] as their healthcare provider to link the account. When it is time for your visit, go to the MyChart app, find appointments, and click Begin Video Visit. Be sure to Select Allow for your device to access the Microphone and Camera for your visit. You will then be connected, and your provider will be with you shortly.  **If they have any issues connecting, or need assistance please contact MyChart service desk [phone])**  **If using a computer, in order to ensure the best quality for their visit they will need to use either of the following Internet Browsers: [employer], or Google Chrome**  IF USING DOXIMITY or DOXY.ME - The patient will receive a link just prior to their visit by text.     FULL LENGTH CONSENT FOR TELE-HEALTH VISIT   I hereby voluntarily request, consent and authorize [facility] and its employed or contracted physicians, physician assistants, nurse practitioners or other licensed health care professionals (the Practitioner), to provide me with telemedicine health care services (the Services") as deemed necessary by the treating Practitioner. I acknowledge and consent to receive the Services by the Practitioner via telemedicine. I understand that the telemedicine visit will involve communicating with the Practitioner through live audiovisual communication technology and the disclosure of certain medical information by electronic transmission. I acknowledge that I have been given the opportunity to request an in-person assessment or other available alternative prior to the telemedicine visit and am voluntarily participating in the telemedicine visit.  I understand that I have the right to withhold or withdraw my consent to the use of  telemedicine in the course of my care at any time, without affecting my right to future care or treatment, and that the Practitioner or I may terminate the telemedicine visit at any time. I understand that I have the right to inspect all information obtained and/or recorded in the course of the telemedicine visit and may receive copies of available information for a reasonable fee.  I understand that some of the potential risks of receiving the Services via telemedicine include:   Delay or interruption in medical evaluation due to technological equipment failure or disruption;  Information transmitted may not be sufficient (e.g. poor resolution of images) to allow for appropriate medical decision making by the Practitioner; and/or   In rare instances, security protocols could fail, causing a breach of personal health information.  Furthermore, I acknowledge that it is my responsibility to provide information about my medical history, conditions and care that is complete and accurate to the best of my ability. I acknowledge that Practitioner's advice, recommendations, and/or decision may be based on factors not within their control, such as incomplete or inaccurate data provided by me  or distortions of diagnostic images or specimens that may result from electronic transmissions. I understand that the practice of medicine is not an exact science and that Practitioner makes no warranties or guarantees regarding treatment outcomes. I acknowledge that I will receive a copy of this consent concurrently upon execution via email to the email address I last provided but may also request a printed copy by calling the office of [facility].    I understand that my insurance will be billed for this visit.   I have read or had this consent read to me.  I understand the contents of this consent, which adequately explains the benefits and risks of the Services being provided via telemedicine.   I have been  provided ample opportunity to ask questions regarding this consent and the Services and have had my questions answered to my satisfaction.  I give my informed consent for the services to be provided through the use of telemedicine in my medical care  By participating in this telemedicine visit I agree to the above.

## 2018-12-06 NOTE — Progress Notes (Signed)
Virtual Visit via Telephone Note   This visit type was conducted due to national recommendations for restrictions regarding the COVID-19 Pandemic (e.g. social distancing) in an effort to limit this patient's exposure and mitigate transmission in our community.  Due to his co-morbid illnesses, this patient is at least at moderate risk for complications without adequate follow up.  This format is felt to be most appropriate for this patient at this time.  The patient did not have access to video technology/had technical difficulties with video requiring transitioning to audio format only (telephone).  All issues noted in this document were discussed and addressed.  No physical exam could be performed with this format.  Please refer to the patient's chart for his  consent to telehealth for [facility].   Date:  12/07/2018   ID:  [name], DOB 12-Nov-1950, MRN [id number]  Patient Location: Home Provider Location: Home  PCP:  Dettinger, Fransisca Kaufmann, MD  Cardiologist:  Minus Breeding, MD  Electrophysiologist:  None   Evaluation Performed:  Follow-Up Visit  Chief Complaint:  CAD  History of Present Illness:    [name] is a 68 y.o. male with who presents for follow up of CAD.  He was hospitalized in March of 2016 with an acute anterior myocardial infarction.  Catheterization demonstrated total occlusion of the LAD proximal to his previously placed stent. He had 30% RCA stenosis and 40% stenosis elsewhere. He was on Plavix at that time and seems to have failed that medication. He did have an ejection fraction of about 45% with anteroapical akinesis.  Since I last saw him he has had surgery for prostate cancer.  He has also seen Dr. Gwenlyn Found and Dr. Scot Dock for claudication.  He has had angiography and had aortobifemoral bypass graft with diffuse 70 - 80% proximal, mid and distal left SFA stenosis. He had 2 vessel runoff with an occluded peroneal.  He was not thought to be a candidate for PTA  or redo surgery.   He was in the ER prior to the last office visit.    Since I last saw him he has done well.  The patient denies any new symptoms such as chest discomfort, neck or arm discomfort. There has been no new shortness of breath, PND or orthopnea. There have been no reported palpitations, presyncope or syncope.   The patient does not have symptoms concerning for COVID-19 infection (fever, chills, cough, or new shortness of breath).    Past Medical History:  Diagnosis Date  . CAD (coronary artery disease) 12/2008   a. cath 11/2011 s/p DES to LAD b. anterior STEMI 09/04/2014 cath DES to occluded prox LAD, 40% AV groove LCx and 30% mid RCA  . Cancer [facility]) 06/2012   prostate, larynx  . CHF (congestive heart failure) ([place])   . COPD (chronic obstructive pulmonary disease) ([place])   . CVA (cerebral infarction) 06/2009  . Elevated PSA 02/2012   6.54  . Hyperlipidemia    intolerant to statins  . Myocardial infarction ([place])   . PVD (peripheral vascular disease) ([place])    Aortobifemoral Bypass 2010, Thrombectomy and repair right brachial artery   . Radiation 08/10/2012-09/22/2012   6525 cGy to larynx  . Stroke ([place])   . Tobacco abuse   . Tubular adenoma   . Unstable angina [facility])    Past Surgical History:  Procedure Laterality Date  . Aortobifemoral bypass  2010  . COLONOSCOPY W/ BIOPSIES    . CORONARY ANGIOPLASTY WITH STENT PLACEMENT  11/2011  . larynx biopsy  07/12/12  . LEFT HEART CATHETERIZATION WITH CORONARY ANGIOGRAM N/A 12/17/2011   Procedure: LEFT HEART CATHETERIZATION WITH CORONARY ANGIOGRAM;  Surgeon: Hillary Bow, MD;  Location: [facility] CATH LAB;  Service: Cardiovascular;  Laterality: N/A;  . LEFT HEART CATHETERIZATION WITH CORONARY ANGIOGRAM N/A 09/04/2014   Procedure: LEFT HEART CATHETERIZATION WITH CORONARY ANGIOGRAM;  Surgeon: Troy Sine, MD;  Location: [facility] CATH LAB;  Service: Cardiovascular;  Laterality: N/A;  . LOWER EXTREMITY ANGIOGRAPHY Left 09/14/2016   Procedure: Lower  Extremity Angiography;  Surgeon: Lorretta Harp, MD;  Location: [facility] CV LAB;  Service: Cardiovascular;  Laterality: Left;  . PERCUTANEOUS CORONARY STENT INTERVENTION (PCI-S) N/A 12/17/2011   Procedure: PERCUTANEOUS CORONARY STENT INTERVENTION (PCI-S);  Surgeon: Hillary Bow, MD;  Location: [facility] CATH LAB;  Service: Cardiovascular;  Laterality: N/A;  . PROSTATE BIOPSY  07/12/12   Gleason's grade 3+3+6  . PROSTATECTOMY  12/2015  . 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 albuterol (PROAIR HFA) 108 (90 Base) MCG/ACT inhaler Inhale 1-2 puffs into the lungs every 6 (six) hours as needed for wheezing or shortness of breath.  Marland Kitchen aspirin EC 81 MG tablet Take 1 tablet (81 mg total) by mouth daily.  Marland Kitchen BRILINTA 90 MG TABS tablet TAKE 1 TABLET (90 MG TOTAL) BY MOUTH 2 (TWO) TIMES DAILY.  Marland Kitchen ezetimibe (ZETIA) 10 MG tablet Take 1 tablet (10 mg total) by mouth daily.  . fluocinonide cream (LIDEX) 0.93 % Apply 1 application topically 2 (two) times daily as needed.  . isosorbide mononitrate (IMDUR) 30 MG 24 hr tablet Take 1 tablet (30 mg total) by mouth daily.  . metoprolol tartrate (LOPRESSOR) 25 MG tablet Take 0.5 tablets (12.5 mg total) by mouth 2 (two) times daily.  . nitroGLYCERIN (NITROSTAT) 0.4 MG SL tablet Place 1 tablet (0.4 mg total) under the tongue every 5 (five) minutes as needed for chest pain (up to 3 doses).  Marland Kitchen REPATHA SURECLICK 267 MG/ML SOAJ Inject 140 mg into the skin every 14 (fourteen) days.  . varenicline (CHANTIX CONTINUING MONTH PAK) 1 MG tablet Take 1 tablet (1 mg total) by mouth 2 (two) times daily.     Allergies:   Contrast media [iodinated diagnostic agents]; Statins; and Other   Social History   Tobacco Use  . Smoking status: Current Every Day Smoker    Packs/day: 0.50    Years: 51.00    Pack years: 25.50    Types: Cigarettes    Start date: 10/07/1967  . Smokeless  tobacco: Never Used  Substance Use Topics  . Alcohol use: No    Alcohol/week: 0.0 standard drinks  . Drug use: No     Family Hx: The patient's family history includes Breast cancer in his mother; Cancer in his paternal grandmother and sister; Coronary artery disease (age of onset: 62) in his father; Coronary artery disease (age of onset: 67) in his brother; Healthy in his daughter, son, and son; Heart attack in his brother; Heart attack (age of onset: 79) in his father; Heart disease in his brother and father; Stroke in his maternal grandmother.  ROS:   Please see the history of present illness.    As stated in the HPI and negative for all other systems.   Prior CV studies:   The following studies were reviewed today:    Labs/Other Tests and  Data Reviewed:    EKG:  No ECG reviewed.  Recent Labs: 01/07/2018: ALT 19 06/02/2018: BUN 13; Creatinine, Ser 1.23; Hemoglobin 16.8; Platelets 219; Potassium 4.0; Sodium 139   Recent Lipid Panel Lab Results  Component Value Date/Time   CHOL 118 07/13/2018 11:39 AM   CHOL 196 12/15/2012 10:07 AM   TRIG 150 (H) 07/13/2018 11:39 AM   TRIG 176 (H) 12/15/2012 10:07 AM   HDL 41 07/13/2018 11:39 AM   HDL 42 12/15/2012 10:07 AM   CHOLHDL 2.9 07/13/2018 11:39 AM   CHOLHDL 5.9 12/18/2011 05:05 AM   LDLCALC 47 07/13/2018 11:39 AM   LDLCALC 119 (H) 12/15/2012 10:07 AM    Wt Readings from Last 3 Encounters:  12/07/18 203 lb (92.1 kg)  10/31/18 207 lb (93.9 kg)  09/14/18 208 lb 9.6 oz (94.6 kg)     Objective:    Vital Signs:  BP 120/75   Ht 5\' 9"  (1.753 m)   Wt 203 lb (92.1 kg)   BMI 29.98 kg/m    VITAL SIGNS:  reviewed  ASSESSMENT & PLAN:    CAD -  The patient has no new sypmtoms.  No further cardiovascular testing is indicated.  We will continue with aggressive risk reduction and meds as listed.  Hyperlipidemia -  He is statin   He is truly statin intolerant.  He has been treated with Repatha.  continue current therapy.   Tobacco abuse -  He is trying Chantix!  Ischemic cardiomyopathy - EF was low normal (50 - 55%) with regional wall motion abnormalities in 2017.   No change in therapy.   COVID-19 Education: The signs and symptoms of COVID-19 were discussed with the patient and how to seek care for testing (follow up with PCP or arrange E-visit).  The importance of social distancing was discussed today.  Time:   Today, I have spent 16 minutes with the patient with telehealth technology discussing the above problems.     Medication Adjustments/Labs and Tests Ordered: Current medicines are reviewed at length with the patient today.  Concerns regarding medicines are outlined above.   Tests Ordered: No orders of the defined types were placed in this encounter.   Medication Changes: No orders of the defined types were placed in this encounter.   Disposition:  Follow up with me in the office in six months.  Signed, Minus Breeding, MD  12/07/2018 11:13 AM    [facility]

## 2018-12-07 ENCOUNTER — Other Ambulatory Visit: Payer: Self-pay

## 2018-12-07 ENCOUNTER — Telehealth (INDEPENDENT_AMBULATORY_CARE_PROVIDER_SITE_OTHER): Payer: Medicare HMO | Admitting: Cardiology

## 2018-12-07 ENCOUNTER — Encounter: Payer: Self-pay | Admitting: Cardiology

## 2018-12-07 VITALS — BP 120/75 | Ht 69.0 in | Wt 203.0 lb

## 2018-12-07 DIAGNOSIS — Z720 Tobacco use: Secondary | ICD-10-CM

## 2018-12-07 DIAGNOSIS — I25119 Atherosclerotic heart disease of native coronary artery with unspecified angina pectoris: Secondary | ICD-10-CM

## 2018-12-07 DIAGNOSIS — E785 Hyperlipidemia, unspecified: Secondary | ICD-10-CM

## 2018-12-07 DIAGNOSIS — Z7189 Other specified counseling: Secondary | ICD-10-CM | POA: Diagnosis not present

## 2018-12-07 NOTE — Patient Instructions (Signed)
Medication Instructions:  The current medical regimen is effective;  continue present plan and medications.  If you need a refill on your cardiac medications before your next appointment, please call your pharmacy.   Follow-Up: Follow up in 6 months with Dr. Hochrein in [place].  You will receive a letter in the mail 2 months before you are due.  Please call us when you receive this letter to schedule your follow up appointment.  Thank you for choosing [facility]!!     

## 2019-01-10 ENCOUNTER — Other Ambulatory Visit: Payer: Self-pay

## 2019-01-11 ENCOUNTER — Telehealth: Payer: Self-pay | Admitting: Family Medicine

## 2019-01-11 ENCOUNTER — Encounter: Payer: Self-pay | Admitting: Family Medicine

## 2019-01-11 ENCOUNTER — Ambulatory Visit (INDEPENDENT_AMBULATORY_CARE_PROVIDER_SITE_OTHER): Payer: Medicare HMO | Admitting: Family Medicine

## 2019-01-11 VITALS — BP 117/83 | HR 65 | Temp 97.3°F | Ht 69.0 in | Wt 203.6 lb

## 2019-01-11 DIAGNOSIS — Z720 Tobacco use: Secondary | ICD-10-CM

## 2019-01-11 DIAGNOSIS — J449 Chronic obstructive pulmonary disease, unspecified: Secondary | ICD-10-CM | POA: Diagnosis not present

## 2019-01-11 DIAGNOSIS — J439 Emphysema, unspecified: Secondary | ICD-10-CM | POA: Diagnosis not present

## 2019-01-11 DIAGNOSIS — I25119 Atherosclerotic heart disease of native coronary artery with unspecified angina pectoris: Secondary | ICD-10-CM

## 2019-01-11 DIAGNOSIS — I739 Peripheral vascular disease, unspecified: Secondary | ICD-10-CM | POA: Diagnosis not present

## 2019-01-11 DIAGNOSIS — E782 Mixed hyperlipidemia: Secondary | ICD-10-CM | POA: Diagnosis not present

## 2019-01-11 MED ORDER — SPIRIVA HANDIHALER 18 MCG IN CAPS: 18.0000 ug | ORAL_CAPSULE | Freq: Every day | RESPIRATORY_TRACT | 3 refills | Status: DC

## 2019-01-11 MED ORDER — ALBUTEROL SULFATE HFA 108 (90 BASE) MCG/ACT IN AERS: 1.0000 | INHALATION_SPRAY | Freq: Four times a day (QID) | RESPIRATORY_TRACT | 11 refills | Status: DC | PRN

## 2019-01-11 NOTE — Progress Notes (Signed)
BP 117/83   Pulse 65   Temp (!) 97.3 F (36.3 C) (Oral)   Ht 5' 9" (1.753 m)   Wt 203 lb 9.6 oz (92.4 kg)   BMI 30.07 kg/m    Subjective:   Patient ID: [name], male    DOB: [birth year], 68 y.o.   MRN: [id number]  HPI: [name] is a 68 y.o. male presenting on 01/11/2019 for Hyperlipidemia (6 month follow up on chronic medical conditoins)   HPI COPD Patient is coming in for COPD recheck today.  He is currently on albuterol and has been eating a lot more with the heat.  He has a mild chronic cough but denies any major coughing spells or wheezing spells.  He has 0nighttime symptoms per week and 7daytime symptoms per week currently.   Hyperlipidemia Patient is coming in for recheck of his hyperlipidemia. The patient is currently taking Repatha and Zetia. They deny any issues with myalgias or history of liver damage from it. They deny any focal numbness or weakness or chest pain.  Patient has known PAD and CAD and is under treatment from cardiology for these including Brilinta and Zetia and Repatha and metoprolol and isosorbide mononitrate, he also has nitroglycerin.  Relevant past medical, surgical, family and social history reviewed and updated as indicated. Interim medical history since our last visit reviewed. Allergies and medications reviewed and updated.  Review of Systems  Constitutional: Negative for chills and fever.  Eyes: Negative for discharge.  Respiratory: Negative for shortness of breath and wheezing.   Cardiovascular: Negative for chest pain and leg swelling.  Musculoskeletal: Negative for back pain and gait problem.  Skin: Negative for rash.  Neurological: Negative for dizziness, weakness and light-headedness.  All other systems reviewed and are negative.   Per HPI unless specifically indicated above   Allergies as of 01/11/2019      Reactions   Contrast Media [iodinated Diagnostic Agents]    Statins Other (See Comments)   Muscle pain   Other  Itching, Swelling, Dermatitis   IV dye      Medication List       Accurate as of January 11, 2019 11:05 AM. If you have any questions, ask your nurse or doctor.        STOP taking these medications   varenicline 1 MG tablet Commonly known as: Chantix Continuing Month Pak Stopped by: Worthy Rancher, MD     TAKE these medications   albuterol 108 (90 Base) MCG/ACT inhaler Commonly known as: ProAir HFA Inhale 1-2 puffs into the lungs every 6 (six) hours as needed for wheezing or shortness of breath.   aspirin EC 81 MG tablet Take 1 tablet (81 mg total) by mouth daily.   Brilinta 90 MG Tabs tablet Generic drug: ticagrelor TAKE 1 TABLET (90 MG TOTAL) BY MOUTH 2 (TWO) TIMES DAILY.   ezetimibe 10 MG tablet Commonly known as: ZETIA Take 1 tablet (10 mg total) by mouth daily.   fluocinonide cream 0.05 % Commonly known as: LIDEX Apply 1 application topically 2 (two) times daily as needed.   isosorbide mononitrate 30 MG 24 hr tablet Commonly known as: IMDUR Take 1 tablet (30 mg total) by mouth daily.   metoprolol tartrate 25 MG tablet Commonly known as: LOPRESSOR Take 0.5 tablets (12.5 mg total) by mouth 2 (two) times daily.   nitroGLYCERIN 0.4 MG SL tablet Commonly known as: NITROSTAT Place 1 tablet (0.4 mg total) under the tongue every 5 (five) minutes as needed  for chest pain (up to 3 doses).   Repatha SureClick 756 MG/ML Soaj Generic drug: Evolocumab Inject 140 mg into the skin every 14 (fourteen) days.   Spiriva HandiHaler 18 MCG inhalation capsule Generic drug: tiotropium Place 1 capsule (18 mcg total) into inhaler and inhale daily. Started by: Fransisca Kaufmann Dettinger, MD        Objective:   BP 117/83   Pulse 65   Temp (!) 97.3 F (36.3 C) (Oral)   Ht 5' 9" (1.753 m)   Wt 203 lb 9.6 oz (92.4 kg)   BMI 30.07 kg/m   Wt Readings from Last 3 Encounters:  01/11/19 203 lb 9.6 oz (92.4 kg)  12/07/18 203 lb (92.1 kg)  10/31/18 207 lb (93.9 kg)    Physical  Exam Vitals signs and nursing note reviewed.  Constitutional:      General: He is not in acute distress.    Appearance: He is well-developed. He is not diaphoretic.  Eyes:     General: No scleral icterus.       Right eye: No discharge.     Conjunctiva/sclera: Conjunctivae normal.     Pupils: Pupils are equal, round, and reactive to light.  Neck:     Musculoskeletal: Neck supple.     Thyroid: No thyromegaly.  Cardiovascular:     Rate and Rhythm: Normal rate and regular rhythm.     Heart sounds: Normal heart sounds. No murmur.  Pulmonary:     Effort: Pulmonary effort is normal. No respiratory distress.     Breath sounds: Normal breath sounds. No wheezing.  Musculoskeletal: Normal range of motion.  Lymphadenopathy:     Cervical: No cervical adenopathy.  Skin:    General: Skin is warm and dry.     Findings: No rash.  Neurological:     Mental Status: He is alert and oriented to person, place, and time.     Coordination: Coordination normal.  Psychiatric:        Behavior: Behavior normal.       Assessment & Plan:   Problem List Items Addressed This Visit      Cardiovascular and Mediastinum   PAD (peripheral artery disease) ([place])   Relevant Orders   CBC with Differential/Platelet   CMP14+EGFR   Lipid panel   Coronary artery disease involving native coronary artery of native heart with angina pectoris ([place])   Relevant Orders   CBC with Differential/Platelet   CMP14+EGFR   Lipid panel     Respiratory   COPD (chronic obstructive pulmonary disease) ([facility])   Relevant Medications   albuterol (PROAIR HFA) 108 (90 Base) MCG/ACT inhaler   tiotropium (SPIRIVA HANDIHALER) 18 MCG inhalation capsule     Other   Hyperlipidemia - Primary   Relevant Orders   CBC with Differential/Platelet   CMP14+EGFR   Lipid panel   Tobacco abuse   Relevant Medications   albuterol (PROAIR HFA) 108 (90 Base) MCG/ACT inhaler   tiotropium (SPIRIVA HANDIHALER) 18 MCG inhalation capsule       Will start Spiriva and see if that helps more with the COPD, will check blood work today and continue to see cardiology for the other issues. Follow up plan: Return in about 6 months (around 07/14/2019), or if symptoms worsen or fail to improve, for COPD and cholesterol recheck.  Counseling provided for all of the vaccine components Orders Placed This Encounter  Procedures  . CBC with Differential/Platelet  . CMP14+EGFR  . Lipid panel    Caryl Pina, MD  [place] 01/11/2019, 11:05 AM

## 2019-01-11 NOTE — Chronic Care Management (AMB) (Deleted)
Chronic Care Management  ° °Note ° °01/11/2019 °Name: [name] MRN: [id number] DOB: 01/12/1951 ° °[name] is a 67 y.o. year old male who is a primary care patient of Dettinger, Joshua A, MD. I reached out to [name] by phone today in response to a referral sent by [name]'s health plan.   ° °[name] was given information about Chronic Care Management services today including:  °1. CCM service includes personalized support from designated clinical staff supervised by his physician, including individualized plan of care and coordination with other care providers °2. 24/7 contact phone numbers for assistance for urgent and routine care needs. °3. Service will only be billed when office clinical staff spend 20 minutes or more in a month to coordinate care. °4. Only one practitioner may furnish and bill the service in a calendar month. °5. The patient may stop CCM services at any time (effective at the end of the month) by phone call to the office staff. °6. The patient will be responsible for cost sharing (co-pay) of up to 20% of the service fee (after annual deductible is met). ° °Patient agreed to services and verbal consent obtained.  ° °Follow up plan: °Telephone appointment with CCM team member scheduled for: 01/16/2019 ° °Bernice Cicero °Care Guide • [facility] °[place]   [facility]  °[email or web address]   ??[phone]   ° ° ° °

## 2019-01-11 NOTE — Chronic Care Management (AMB) (Signed)
Chronic Care Management  ° °Note ° °01/11/2019 °Name: [name] MRN: [id number] DOB: 02/07/1951 ° °[name] is a 67 y.o. year old male who is a primary care patient of Dettinger, Joshua A, MD. I reached out to Arius Huyett by phone today in response to a referral sent by Mr. Berkley Holdren's health plan.   ° °Mr. Anstey was given information about Chronic Care Management services today including:  °1. CCM service includes personalized support from designated clinical staff supervised by his physician, including individualized plan of care and coordination with other care providers °2. 24/7 contact phone numbers for assistance for urgent and routine care needs. °3. Service will only be billed when office clinical staff spend 20 minutes or more in a month to coordinate care. °4. Only one practitioner may furnish and bill the service in a calendar month. °5. The patient may stop CCM services at any time (effective at the end of the month) by phone call to the office staff. °6. The patient will be responsible for cost sharing (co-pay) of up to 20% of the service fee (after annual deductible is met). ° °Patients wife [name] agreed to services and verbal consent obtained.  ° °Follow up plan: °Telephone appointment with CCM team member scheduled for: 01/16/19 ° °Bernice Cicero °Care Guide • [facility] °[place]   [facility]  °[email or web address]   ??[phone]   ° ° ° °

## 2019-01-12 LAB — CMP14+EGFR
ALT: 23 IU/L (ref 0–44)
AST: 18 IU/L (ref 0–40)
Albumin/Globulin Ratio: 1.7 (ref 1.2–2.2)
Albumin: 4.4 g/dL (ref 3.8–4.8)
Alkaline Phosphatase: 84 IU/L (ref 39–117)
BUN/Creatinine Ratio: 14 (ref 10–24)
BUN: 15 mg/dL (ref 8–27)
Bilirubin Total: 0.4 mg/dL (ref 0.0–1.2)
CO2: 24 mmol/L (ref 20–29)
Calcium: 9.6 mg/dL (ref 8.6–10.2)
Chloride: 102 mmol/L (ref 96–106)
Creatinine, Ser: 1.04 mg/dL (ref 0.76–1.27)
GFR calc Af Amer: 86 mL/min/{1.73_m2} (ref >59)
GFR calc non Af Amer: 74 mL/min/{1.73_m2} (ref >59)
Globulin, Total: 2.6 g/dL (ref 1.5–4.5)
Glucose: 93 mg/dL (ref 65–99)
Potassium: 4.5 mmol/L (ref 3.5–5.2)
Sodium: 142 mmol/L (ref 134–144)
Total Protein: 7 g/dL (ref 6.0–8.5)

## 2019-01-12 LAB — LIPID PANEL
Chol/HDL Ratio: 2.8 ratio (ref 0.0–5.0)
Cholesterol, Total: 126 mg/dL (ref 100–199)
HDL: 45 mg/dL (ref >39)
LDL Calculated: 35 mg/dL (ref 0–99)
Triglycerides: 230 mg/dL — ABNORMAL HIGH (ref 0–149)
VLDL Cholesterol Cal: 46 mg/dL — ABNORMAL HIGH (ref 5–40)

## 2019-01-12 LAB — CBC WITH DIFFERENTIAL/PLATELET
Basophils Absolute: 0.1 10*3/uL (ref 0.0–0.2)
Basos: 1 %
EOS (ABSOLUTE): 0.1 10*3/uL (ref 0.0–0.4)
Eos: 2 %
Hematocrit: 50 % (ref 37.5–51.0)
Hemoglobin: 17 g/dL (ref 13.0–17.7)
Immature Grans (Abs): 0 10*3/uL (ref 0.0–0.1)
Immature Granulocytes: 1 %
Lymphocytes Absolute: 2.1 10*3/uL (ref 0.7–3.1)
Lymphs: 32 %
MCH: 30.9 pg (ref 26.6–33.0)
MCHC: 34 g/dL (ref 31.5–35.7)
MCV: 91 fL (ref 79–97)
Monocytes Absolute: 0.5 10*3/uL (ref 0.1–0.9)
Monocytes: 8 %
Neutrophils Absolute: 3.8 10*3/uL (ref 1.4–7.0)
Neutrophils: 56 %
Platelets: 222 10*3/uL (ref 150–450)
RBC: 5.51 x10E6/uL (ref 4.14–5.80)
RDW: 13.1 % (ref 11.6–15.4)
WBC: 6.6 10*3/uL (ref 3.4–10.8)

## 2019-01-16 ENCOUNTER — Telehealth: Payer: Medicare HMO

## 2019-01-16 ENCOUNTER — Ambulatory Visit (INDEPENDENT_AMBULATORY_CARE_PROVIDER_SITE_OTHER): Payer: Medicare HMO | Admitting: *Deleted

## 2019-01-16 DIAGNOSIS — I739 Peripheral vascular disease, unspecified: Secondary | ICD-10-CM

## 2019-01-16 DIAGNOSIS — J439 Emphysema, unspecified: Secondary | ICD-10-CM | POA: Diagnosis not present

## 2019-01-16 DIAGNOSIS — I25119 Atherosclerotic heart disease of native coronary artery with unspecified angina pectoris: Secondary | ICD-10-CM

## 2019-01-16 DIAGNOSIS — I6930 Unspecified sequelae of cerebral infarction: Secondary | ICD-10-CM

## 2019-01-16 NOTE — Patient Instructions (Signed)
Visit Information  Goals Addressed            This Visit's Progress     Patient Stated   . "I don't want to fall" (pt-stated)       Current Barriers:  . 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 Advised patient to move carefully and purposefully to prevent falls . Provided education to patient re: falls prevention in the home (oral education provided and EMMI handouts to be printed and mailed)  Patient Self Care Activities:  . Performs ADL's independently . Performs IADL's independently  Initial goal documentation     . "I need to eat better to get my triglycerides down" (pt-stated)       Current Barriers:  . Chronic Disease Management support and education needs related to elevated triglyceride levels  Nurse Case Manager Clinical Goal(s):  Marland Kitchen Over the next 30 days, patient will meet with RN Care Manager to address hyperlipidemia . Over the next 90 days, patient will demonstrate improved adherence to prescribed treatment plan for hyperlipidemia as evidenced bylipide levels within normal range on next lab test  Interventions:  . Advised patient to decrease the amount of sugars and simple carbohydrates in his diet . Provided education to patient re: how to manage elevated triglyceride levels . Reviewed and discussed medications  Patient Self Care Activities:  . Performs ADL's independently . Performs IADL's independently  Initial goal documentation     . "I want to continue breathing better" (pt-stated)       Current Barriers:  . Chronic Disease Management support and education needs related to COPD  Nurse Case Manager Clinical Goal(s):  Marland Kitchen Over the next 30 days, patient will meet with RN Care Manager to address COPD management . Over the next 30 days, patient  will demonstrate understanding of rationale for each prescribed medication as evidenced by using spiriva inhaler daily as prescribed and only using albuterol rescue inhaler as needed  . Over the next 30 days, patient will demonstrate understanding of rationale for rescue inhaler by notifying RN Care Manager by calling [phone] or PCP by calling [phone] of he is using the rescue inhaler more than a few times a week.   Interventions:  . Provided education to patient re: rescue inhaler vs maintenance inhaler . Advised to notify RNCM or PCP if having to use rescue inhaler more than a few times a week . Encouraged to continue medications as prescribed . Encouraged to avoid going out during the heat of the day  Patient Self Care Activities:  . Performs ADL's independently . Performs IADL's independently  Initial goal documentation     . "I want to increase my acitivty level" (pt-stated)       Current Barriers:  . Chronic Disease Management support and education needs related to physical activity level . Extreme heat . UMPNT61 social distancing restrictions . COPD  Nurse Case Manager Clinical Goal(s):  Marland Kitchen Over the next 30 days, patient will work with Consulting civil engineer to address needs related to decreased physical activity level  Interventions:  . Advised patient to avoid exercising in the heat of the day . Provided education to patient re: exercises that he can do indoors during the hot summer months (oral information provided and EMMI handouts to be mailed)  Patient Self Care Activities:  . Performs ADL's independently . Performs IADL's independently  Initial goal documentation        The patient verbalized understanding of instructions provided today and declined a print copy of patient instruction materials.   The care management team will reach out to the patient again over the next 30 days.  CCM contact information provided  Chong Sicilian BSN, RN-BC [place] [facility] [place] Management Direct Dial: [phone]

## 2019-01-16 NOTE — Chronic Care Management (AMB) (Signed)
Chronic Care Management   Initial Visit Note  01/16/2019 Name: [name] MRN: [id number] DOB: 11-11-1950  Referred by: Dettinger, Fransisca Kaufmann, MD Reason for referral : Chronic Care Management (RNCM Initial Outreach)   [name] is a 68 y.o. year old male who is a primary care patient of Dettinger, Fransisca Kaufmann, MD. The CCM team was consulted for assistance with chronic disease management and care coordination needs.   Review of patient status, including review of consultants reports, relevant laboratory and other test results, and collaboration with appropriate care team members and the patient's provider was performed as part of comprehensive patient evaluation and provision of chronic care management services.    SDOH (Social Determinants of Health) screening performed today. See Care Plan Entry related to challenges with: Physical Activity   Subjective: I spoke with [name] by telephone today. He is retired and lives at home with his wife, [name]. He as physically active,walking daily, until recently. Covid-19 social distancing requirements and excessive summertime heat have kept him from walking regularly. His breathing has improved since starting Spiriva 4 days ago. He was using his albuterol inhaler daily.   Objective:  Wt Readings from Last 3 Encounters:  01/11/19 203 lb 9.6 oz (92.4 kg)  12/07/18 203 lb (92.1 kg)  10/31/18 207 lb (93.9 kg)   BMI Readings from Last 3 Encounters:  01/11/19 30.07 kg/m  12/07/18 29.98 kg/m  10/31/18 36.67 kg/m   Fall Risk  01/16/2019  Falls in the past year? 0  Number falls in past yr: 0  Injury with Fall? 0  Risk for fall due to : Impaired balance/gait  Risk for fall due to: Comment left sided weakness, latent effects of CVA  Follow up Falls prevention discussed    Assessment & Plan Goals Addressed            This Visit's Progress     Patient Stated   . "I don't want to fall" (pt-stated)       Current Barriers:  .  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 Advised patient to move carefully and purposefully to prevent falls . Provided education to patient re: falls prevention in the home (oral education provided and EMMI handouts to be printed and mailed)  Patient Self Care Activities:  . Performs ADL's independently . Performs IADL's independently  Initial goal documentation     . "I need to eat better to get my triglycerides down" (pt-stated)       Current Barriers:  . Chronic Disease Management support and education needs related to elevated triglyceride levels  Nurse Case Manager Clinical Goal(s):  Marland Kitchen Over the next 30 days, patient will meet with RN Care Manager to address hyperlipidemia . Over the next 90 days, patient will demonstrate improved adherence to prescribed treatment plan for hyperlipidemia as evidenced bylipide levels within normal range on next lab test  Interventions:  . Advised patient to decrease the amount of sugars and simple carbohydrates in his diet . Provided education to patient re: how to manage elevated triglyceride levels . Reviewed and discussed medications  Patient Self Care Activities:  . Performs ADL's independently . Performs IADL's independently  Initial goal documentation     . "I want to continue breathing better" (pt-stated)  Current Barriers:  . Chronic Disease Management support and education needs related to COPD  Nurse Case Manager Clinical Goal(s):  Marland Kitchen Over the next 30 days, patient will meet with RN Care Manager to address COPD management . Over the next 30 days, patient will demonstrate understanding of rationale for each prescribed medication as evidenced by using spiriva inhaler daily as prescribed and only using albuterol rescue  inhaler as needed  . Over the next 30 days, patient will demonstrate understanding of rationale for rescue inhaler by notifying RN Care Manager by calling [phone] or PCP by calling [phone] of he is using the rescue inhaler more than a few times a week.   Interventions:  . Provided education to patient re: rescue inhaler vs maintenance inhaler . Advised to notify RNCM or PCP if having to use rescue inhaler more than a few times a week . Encouraged to continue medications as prescribed . Encouraged to avoid going out during the heat of the day  Patient Self Care Activities:  . Performs ADL's independently . Performs IADL's independently  Initial goal documentation     . "I want to increase my acitivty level" (pt-stated)       Current Barriers:  . Chronic Disease Management support and education needs related to physical activity level . Extreme heat . [id number] social distancing restrictions . COPD  Nurse Case Manager Clinical Goal(s):  Marland Kitchen Over the next 30 days, patient will work with Consulting civil engineer to address needs related to decreased physical activity level  Interventions:  . Advised patient to avoid exercising in the heat of the day . Provided education to patient re: exercises that he can do indoors during the hot summer months (oral information provided and EMMI handouts to be mailed)  Patient Self Care Activities:  . Performs ADL's independently . Performs IADL's independently  Initial goal documentation            The care management team will reach out to the patient again over the next 30 days.   Chong Sicilian BSN, RN-BC Embedded Chronic Care Manager [facility] [place] [facility] Direct Dial: [phone]

## 2019-01-21 ENCOUNTER — Other Ambulatory Visit (INDEPENDENT_AMBULATORY_CARE_PROVIDER_SITE_OTHER): Payer: Medicare HMO | Admitting: Family Medicine

## 2019-01-21 DIAGNOSIS — R0989 Other specified symptoms and signs involving the circulatory and respiratory systems: Secondary | ICD-10-CM | POA: Diagnosis not present

## 2019-01-21 NOTE — Progress Notes (Signed)
Virtual Visit via telephone Note  I connected with [name] on 01/21/19 at 1139 by telephone and verified that I am speaking with the correct person using two identifiers. [name] is currently located at home and no other people are currently with her during visit. The provider, Fransisca Kaufmann , MD is located in their office at time of visit.  Call ended at 1147  I discussed the limitations, risks, security and privacy concerns of performing an evaluation and management service by telephone and the availability of in person appointments. I also discussed with the patient that there may be a patient responsible charge related to this service. The patient expressed understanding and agreed to proceed.   History and Present Illness: Patient has been having headaches and chest congestion and sore throat.  Patient takes spiriva and has not had increased wheezing or shortness.  He had chills and body aches and it all started yesterday. He has dry coughing fits. He has been mostly keeping at home  No diagnosis found.  Outpatient Encounter Medications as of 01/21/2019  Medication Sig  . albuterol (PROAIR HFA) 108 (90 Base) MCG/ACT inhaler Inhale 1-2 puffs into the lungs every 6 (six) hours as needed for wheezing or shortness of breath.  Marland Kitchen aspirin EC 81 MG tablet Take 1 tablet (81 mg total) by mouth daily.  Marland Kitchen BRILINTA 90 MG TABS tablet TAKE 1 TABLET (90 MG TOTAL) BY MOUTH 2 (TWO) TIMES DAILY.  Marland Kitchen ezetimibe (ZETIA) 10 MG tablet Take 1 tablet (10 mg total) by mouth daily.  . fluocinonide cream (LIDEX) 3.47 % Apply 1 application topically 2 (two) times daily as needed.  . isosorbide mononitrate (IMDUR) 30 MG 24 hr tablet Take 1 tablet (30 mg total) by mouth daily.  . metoprolol tartrate (LOPRESSOR) 25 MG tablet Take 0.5 tablets (12.5 mg total) by mouth 2 (two) times daily.  . nitroGLYCERIN (NITROSTAT) 0.4 MG SL tablet Place 1 tablet (0.4 mg total) under the tongue every 5 (five) minutes  as needed for chest pain (up to 3 doses).  Marland Kitchen REPATHA SURECLICK 425 MG/ML SOAJ Inject 140 mg into the skin every 14 (fourteen) days.  Marland Kitchen tiotropium (SPIRIVA HANDIHALER) 18 MCG inhalation capsule Place 1 capsule (18 mcg total) into inhaler and inhale daily.   No facility-administered encounter medications on file as of 01/21/2019.     Review of Systems  Constitutional: Positive for chills. Negative for fever.  HENT: Positive for congestion, postnasal drip, rhinorrhea, sinus pressure, sneezing and sore throat. Negative for ear discharge, ear pain and voice change.   Eyes: Negative for pain, discharge, redness and visual disturbance.  Respiratory: Positive for cough. Negative for shortness of breath and wheezing.   Cardiovascular: Negative for chest pain and leg swelling.  Musculoskeletal: Positive for myalgias. Negative for back pain and gait problem.  Skin: Negative for rash.  Neurological: Negative for dizziness, weakness and light-headedness.  All other systems reviewed and are negative.   Observations/Objective: Patient sounds comfortable and in no acute distress  Assessment and Plan: Problem List Items Addressed This Visit    None    Visit Diagnoses    Chest congestion    -  Primary   Relevant Orders   Novel Coronavirus, NAA ([facility])       Follow Up Instructions: As needed  Sent for covid testing and recommended quarantine    I discussed the assessment and treatment plan with the patient. The patient was provided an opportunity to ask questions and all were  answered. The patient agreed with the plan and demonstrated an understanding of the instructions.   The patient was advised to call back or seek an in-person evaluation if the symptoms worsen or if the condition fails to improve as anticipated.  The above assessment and management plan was discussed with the patient. The patient verbalized understanding of and has agreed to the management plan. Patient is aware to call  the clinic if symptoms persist or worsen. Patient is aware when to return to the clinic for a follow-up visit. Patient educated on when it is appropriate to go to the emergency department.    I provided 8 minutes of non-face-to-face time during this encounter.    Worthy Rancher, MD

## 2019-01-22 ENCOUNTER — Encounter: Payer: Self-pay | Admitting: Family Medicine

## 2019-01-23 ENCOUNTER — Other Ambulatory Visit: Payer: Medicare HMO

## 2019-01-23 ENCOUNTER — Other Ambulatory Visit: Payer: Self-pay

## 2019-01-23 DIAGNOSIS — Z20822 Contact with and (suspected) exposure to covid-19: Secondary | ICD-10-CM

## 2019-01-23 DIAGNOSIS — R6889 Other general symptoms and signs: Secondary | ICD-10-CM | POA: Diagnosis not present

## 2019-01-25 LAB — NOVEL CORONAVIRUS, NAA: SARS-CoV-2, NAA: NOT DETECTED

## 2019-01-26 ENCOUNTER — Other Ambulatory Visit: Payer: Self-pay | Admitting: Cardiovascular Disease

## 2019-02-22 ENCOUNTER — Other Ambulatory Visit: Payer: Self-pay | Admitting: Family Medicine

## 2019-02-22 ENCOUNTER — Other Ambulatory Visit: Payer: Self-pay | Admitting: Cardiology

## 2019-02-22 ENCOUNTER — Other Ambulatory Visit: Payer: Self-pay | Admitting: Cardiovascular Disease

## 2019-02-22 DIAGNOSIS — Z716 Tobacco abuse counseling: Secondary | ICD-10-CM

## 2019-02-24 NOTE — Telephone Encounter (Signed)
Rx request sent to pharmacy.  

## 2019-02-28 DIAGNOSIS — Z720 Tobacco use: Secondary | ICD-10-CM | POA: Diagnosis not present

## 2019-02-28 DIAGNOSIS — C320 Malignant neoplasm of glottis: Secondary | ICD-10-CM | POA: Diagnosis not present

## 2019-03-24 ENCOUNTER — Other Ambulatory Visit: Payer: Self-pay | Admitting: Cardiovascular Disease

## 2019-03-27 IMAGING — DX DG HIP (WITH OR WITHOUT PELVIS) 2-3V*R*
3 series · 3 of 3 positions shown · non-contrast
Comparison: PET scan 07/26/2012

CLINICAL DATA: Right groin pain.  History of prostate cancer.

EXAM:
DG HIP (WITH OR WITHOUT PELVIS) 2-3V RIGHT

[pelvis ap]
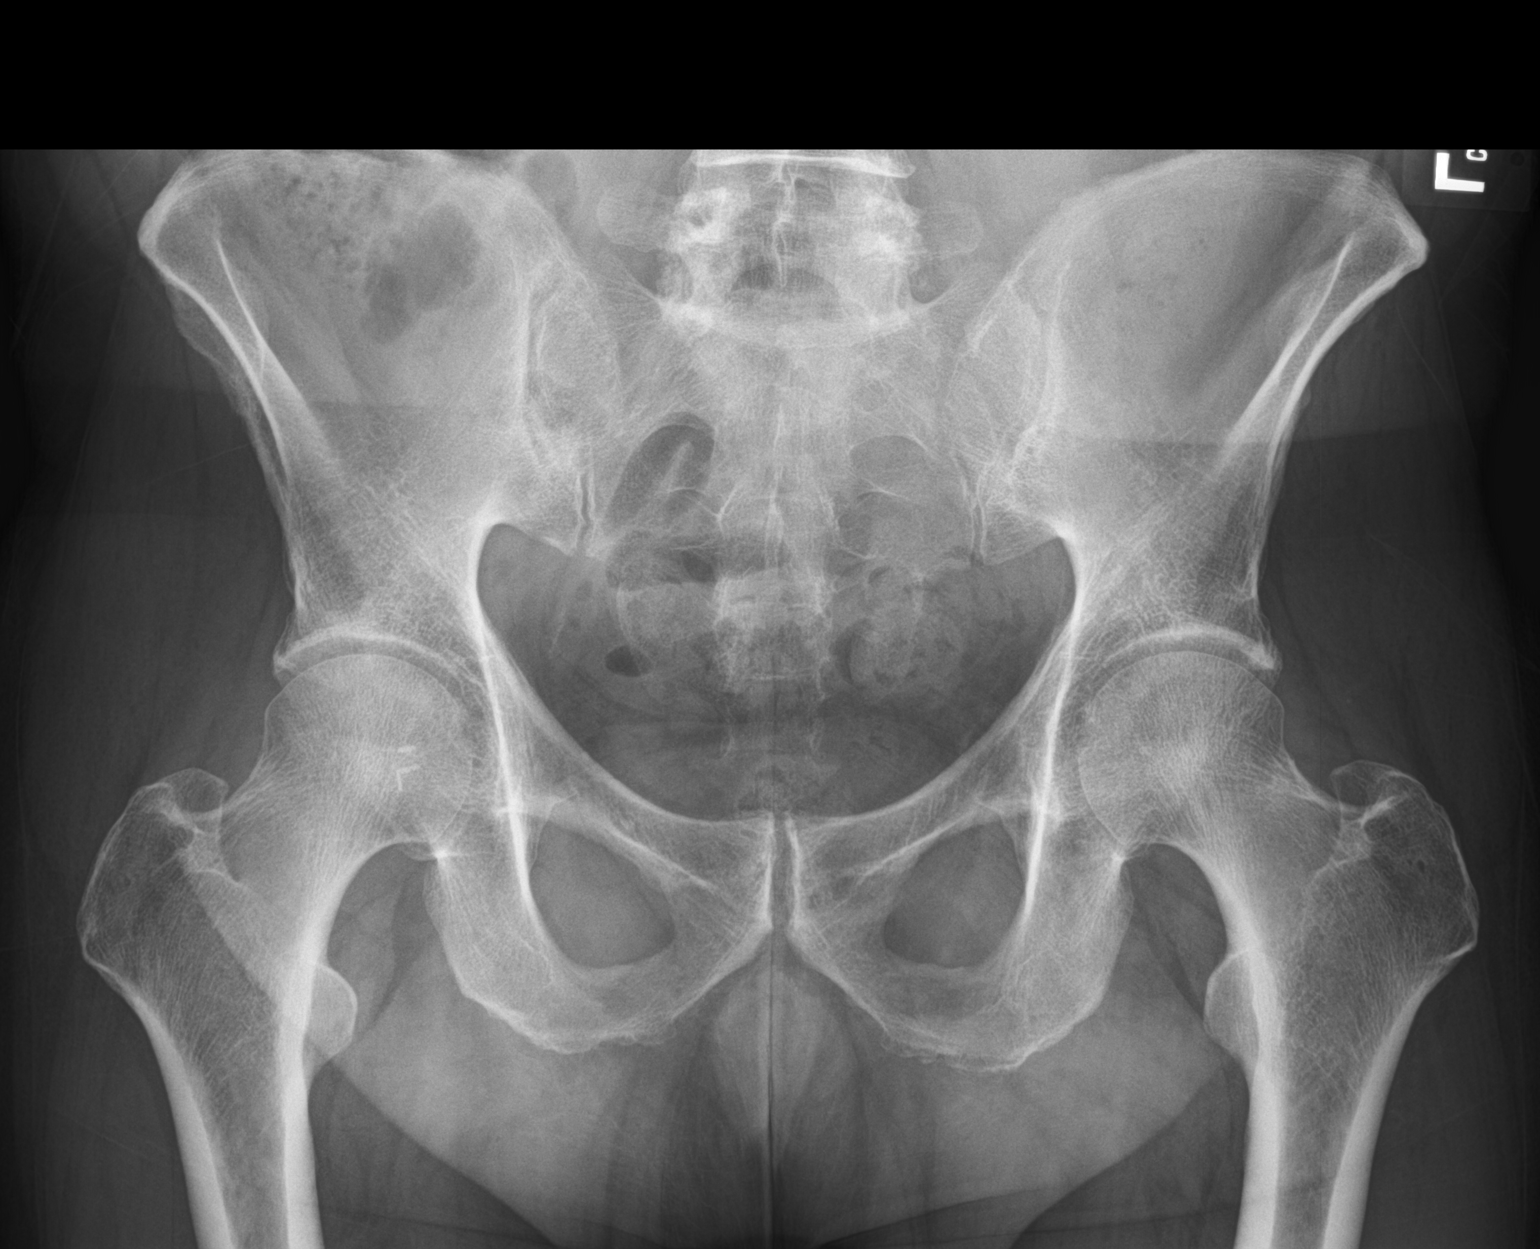

[hip ap]
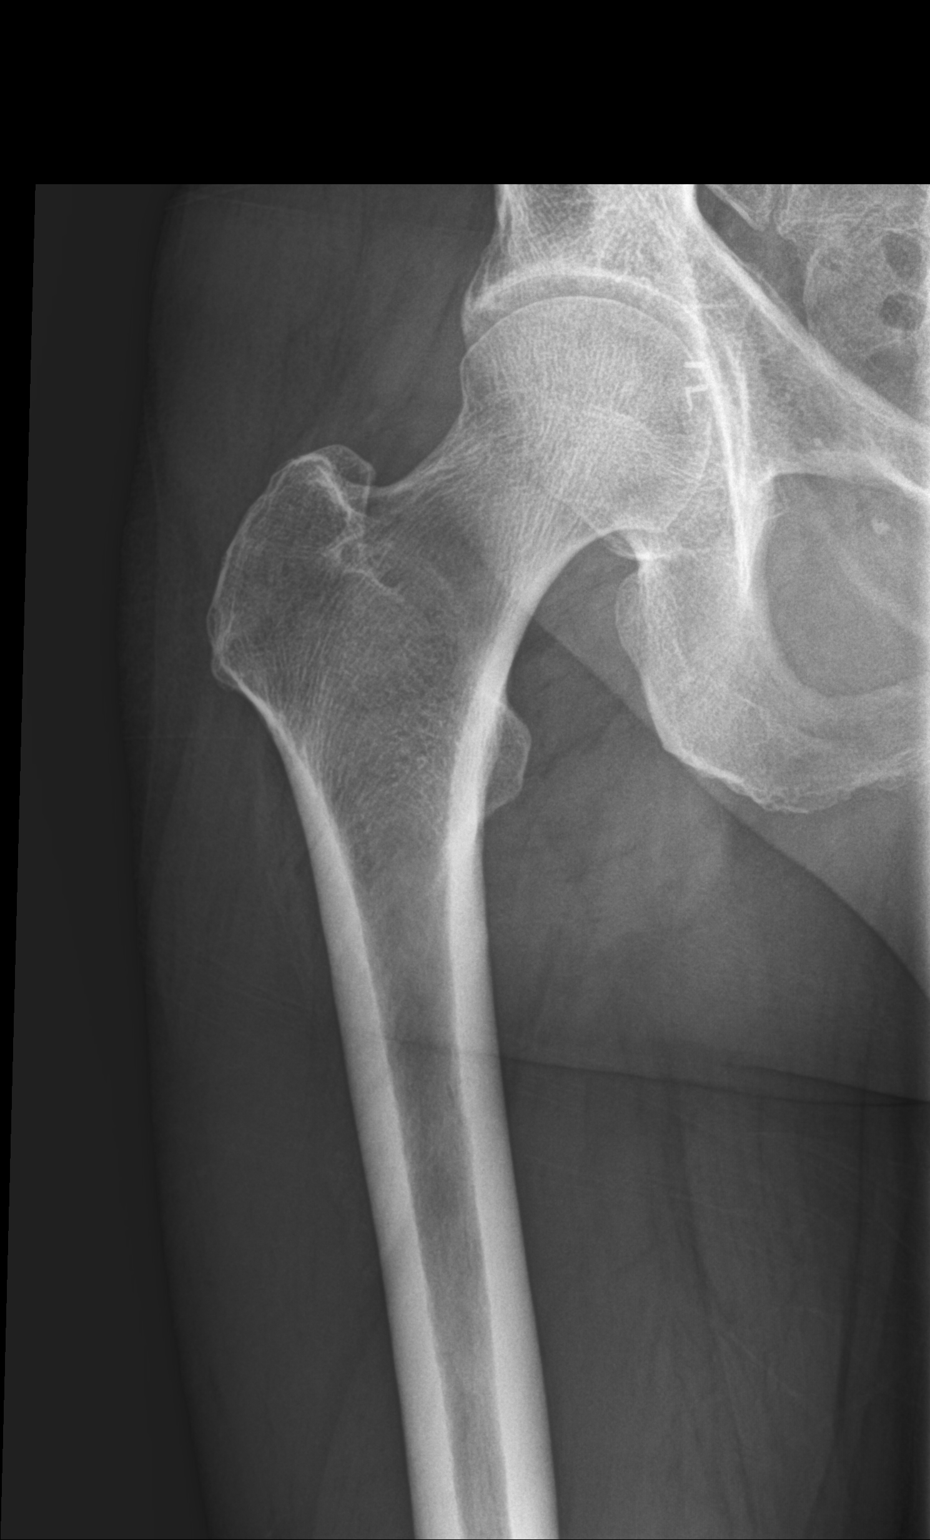

[hip lat]
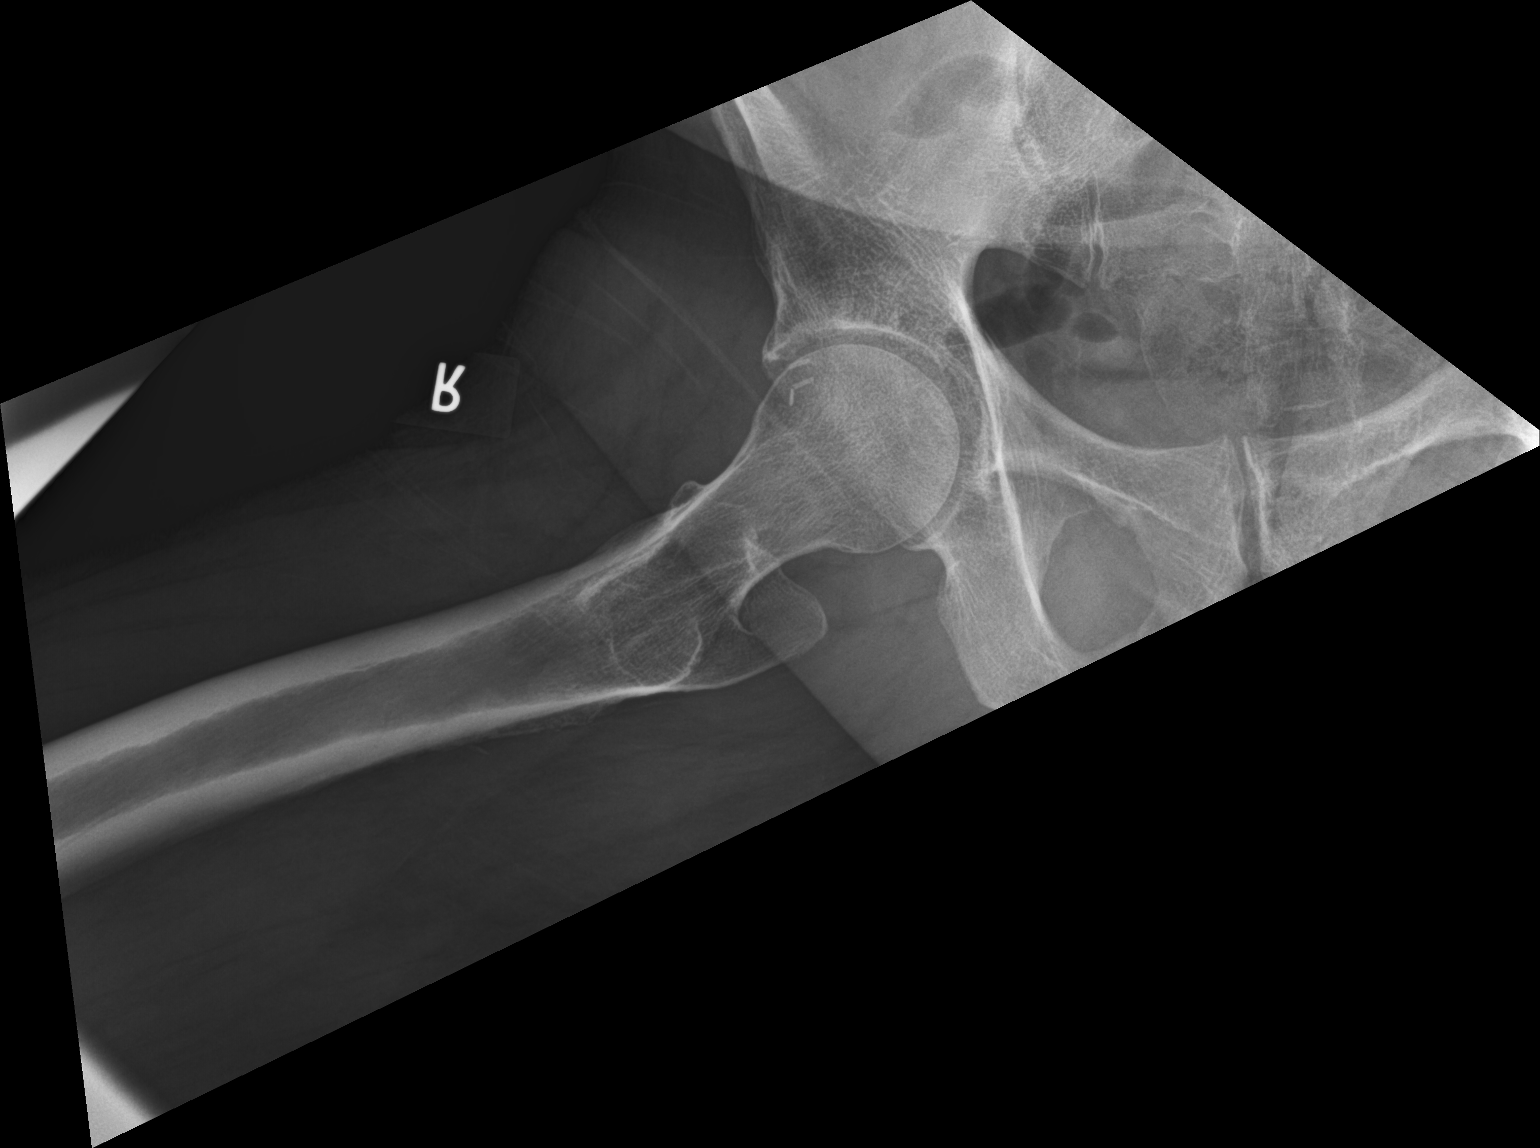

[3 of 3 positions shown; findings below may reference images not displayed]

FINDINGS: Bones of the pelvis appear normal. Both hips appear normal. No joint
space narrowing. No osteophyte formation. No evidence of avascular
necrosis or other focal bone lesion. Surgical clips are present
overlying the right hip region, possibly secondary to hernia repair.
IMPRESSION: No evidence of bone or joint abnormality.

## 2019-03-31 ENCOUNTER — Other Ambulatory Visit: Payer: Self-pay | Admitting: Cardiology

## 2019-04-03 ENCOUNTER — Other Ambulatory Visit: Payer: Self-pay

## 2019-04-03 MED ORDER — REPATHA SURECLICK 140 MG/ML ~~LOC~~ SOAJ: 140.0000 mg | SUBCUTANEOUS | 11 refills | Status: DC

## 2019-05-17 ENCOUNTER — Telehealth: Payer: Self-pay | Admitting: Family Medicine

## 2019-05-17 NOTE — Chronic Care Management (AMB) (Signed)
°  Care Management   Note  05/17/2019 Name: Essien Bahl MRN: [id number] DOB: 1950-07-26  Bingham Picciotto is a 68 y.o. year old male who is a primary care patient of Dettinger, Fransisca Kaufmann, MD and is actively engaged with the care management team. I reached out to [name] by phone today to assist with scheduling a follow up appointment with the RN Case Manager  Follow up plan: Telephone appointment with CCM team member scheduled for: 05/10/2019  [place], [place] [facility]  [place], [place] [address] Direct Dial: [email or web address]  Website: [email or web address]

## 2019-05-19 ENCOUNTER — Ambulatory Visit: Payer: Medicare HMO | Admitting: *Deleted

## 2019-06-24 ENCOUNTER — Other Ambulatory Visit: Payer: Self-pay | Admitting: Cardiovascular Disease

## 2019-07-11 NOTE — Progress Notes (Signed)
Virtual Visit via Telephone Note   This visit type was conducted due to national recommendations for restrictions regarding the COVID-19 Pandemic (e.g. social distancing) in an effort to limit this patient's exposure and mitigate transmission in our community.  Due to his co-morbid illnesses, this patient is at least at moderate risk for complications without adequate follow up.  This format is felt to be most appropriate for this patient at this time.  The patient did not have access to video technology/had technical difficulties with video requiring transitioning to audio format only (telephone).  All issues noted in this document were discussed and addressed.  No physical exam could be performed with this format.  Please refer to the patient's chart for his  consent to telehealth for [facility].   Date:  07/12/2019   ID:  [name], DOB 01-26-1951, MRN [id number]  Patient Location: Home Provider Location: Home  PCP:  Dettinger, Fransisca Kaufmann, MD  Cardiologist:  Minus Breeding, MD  Electrophysiologist:  None   Evaluation Performed:  Follow-Up Visit  Chief Complaint:  CAD  History of Present Illness:    [name] is a 69 y.o. male who presents for follow up of CAD. He was hospitalized in March of 2016 with an acute anterior myocardial infarction. Catheterization demonstrated total occlusion of the LAD proximal to his previously placed stent. He had 30% RCA stenosis and 40% stenosis elsewhere. He was on Plavix at that time and seems to have failed that medication. He did have an ejection fraction of about 45% with anteroapical akinesis. He has also seen Dr. Gwenlyn Found and Dr. Scot Dock for claudication. He has had angiography and had aortobifemoral bypass graft with diffuse 70 - 80% proximal, mid and distal left SFA stenosis. He had 2 vessel runoff with an occluded peroneal. He was not thought to be a candidate for PTA or redo surgery.     Since I last saw him he has done well.  The  patient denies any new symptoms such as chest discomfort, neck or arm discomfort. There has been no new shortness of breath, PND or orthopnea. There have been no reported palpitations, presyncope or syncope.  The patient does not have symptoms concerning for COVID-19 infection (fever, chills, cough, or new shortness of breath).    Past Medical History:  Diagnosis Date  . CAD (coronary artery disease) 12/2008   a. cath 11/2011 s/p DES to LAD b. anterior STEMI 09/04/2014 cath DES to occluded prox LAD, 40% AV groove LCx and 30% mid RCA  . Cancer [facility]) 06/2012   prostate, larynx  . CHF (congestive heart failure) ([place])   . COPD (chronic obstructive pulmonary disease) ([place])   . CVA (cerebral infarction) 06/2009  . Elevated PSA 02/2012   6.54  . Hyperlipidemia    intolerant to statins  . Myocardial infarction ([place])   . PVD (peripheral vascular disease) ([place])    Aortobifemoral Bypass 2010, Thrombectomy and repair right brachial artery   . Radiation 08/10/2012-09/22/2012   6525 cGy to larynx  . Stroke ([place])   . Tobacco abuse   . Tubular adenoma   . Unstable angina [facility])    Past Surgical History:  Procedure Laterality Date  . Aortobifemoral bypass  2010  . COLONOSCOPY W/ BIOPSIES    . CORONARY ANGIOPLASTY WITH STENT PLACEMENT  11/2011  . larynx biopsy  07/12/12  . LEFT HEART CATHETERIZATION WITH CORONARY ANGIOGRAM N/A 12/17/2011   Procedure: LEFT HEART CATHETERIZATION WITH CORONARY ANGIOGRAM;  Surgeon: Hillary Bow, MD;  Location: [place] CATH LAB;  Service: Cardiovascular;  Laterality: N/A;  . LEFT HEART CATHETERIZATION WITH CORONARY ANGIOGRAM N/A 09/04/2014   Procedure: LEFT HEART CATHETERIZATION WITH CORONARY ANGIOGRAM;  Surgeon: Troy Sine, MD;  Location: [facility] CATH LAB;  Service: Cardiovascular;  Laterality: N/A;  . LOWER EXTREMITY ANGIOGRAPHY Left 09/14/2016   Procedure: Lower Extremity Angiography;  Surgeon: Lorretta Harp, MD;  Location: [place] CV LAB;  Service: Cardiovascular;   Laterality: Left;  . PERCUTANEOUS CORONARY STENT INTERVENTION (PCI-S) N/A 12/17/2011   Procedure: PERCUTANEOUS CORONARY STENT INTERVENTION (PCI-S);  Surgeon: Hillary Bow, MD;  Location: [facility] CATH LAB;  Service: Cardiovascular;  Laterality: N/A;  . PROSTATE BIOPSY  07/12/12   Gleason's grade 3+3+6  . PROSTATECTOMY  12/2015  . 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 albuterol (PROAIR HFA) 108 (90 Base) MCG/ACT inhaler Inhale 1-2 puffs into the lungs every 6 (six) hours as needed for wheezing or shortness of breath.  Marland Kitchen aspirin EC 81 MG tablet Take 1 tablet (81 mg total) by mouth daily.  Marland Kitchen BRILINTA 90 MG TABS tablet TAKE  (1)  TABLET TWICE A DAY.  Marland Kitchen Evolocumab (REPATHA SURECLICK) [id number] MG/ML SOAJ Inject 140 mg as directed every 14 (fourteen) days.  Marland Kitchen ezetimibe (ZETIA) 10 MG tablet Take 1 tablet (10 mg total) by mouth daily.  . fluocinonide cream (LIDEX) [id number] % Apply 1 application topically 2 (two) times daily as needed.  . isosorbide mononitrate (IMDUR) 30 MG 24 hr tablet Take 1 tablet (30 mg total) by mouth daily.  . metoprolol tartrate (LOPRESSOR) 25 MG tablet Take 0.5 tablets (12.5 mg total) by mouth 2 (two) times daily.  . nitroGLYCERIN (NITROSTAT) 0.4 MG SL tablet Place 1 tablet (0.4 mg total) under the tongue every 5 (five) minutes as needed for chest pain (up to 3 doses).  . tiotropium (SPIRIVA HANDIHALER) 18 MCG inhalation capsule Place 1 capsule (18 mcg total) into inhaler and inhale daily.     Allergies:   Contrast media [iodinated diagnostic agents], Statins, and Other   Social History   Tobacco Use  . Smoking status: Current Every Day Smoker    Packs/day: 0.50    Years: 51.00    Pack years: 25.50    Types: Cigarettes    Start date: 10/07/1967  . Smokeless tobacco: Never Used  Substance Use Topics  . Alcohol use: No    Alcohol/week: 0.0 standard drinks  . Drug use: No      Family Hx: The patient's family history includes Breast cancer in his mother; Cancer in his paternal grandmother and sister; Coronary artery disease (age of onset: 86) in his father; Coronary artery disease (age of onset: 73) in his brother; Healthy in his daughter, son, and son; Heart attack in his brother; Heart attack (age of onset: 11) in his father; Heart disease in his brother and father; Stroke in his maternal grandmother.  ROS:   Please see the history of present illness.     All other systems reviewed and are negative.   Prior CV studies:   The following studies were reviewed today:  Labs  Labs/Other Tests and Data Reviewed:    EKG:  No ECG reviewed.  Recent Labs: 01/11/2019: ALT 23; BUN 15; Creatinine, Ser 1.04; Hemoglobin 17.0; Platelets 222; Potassium 4.5; Sodium 142   Recent Lipid Panel Lab Results  Component Value Date/Time  CHOL 126 01/11/2019 11:14 AM   CHOL 196 12/15/2012 10:07 AM   TRIG 230 (H) 01/11/2019 11:14 AM   TRIG 176 (H) 12/15/2012 10:07 AM   HDL 45 01/11/2019 11:14 AM   HDL 42 12/15/2012 10:07 AM   CHOLHDL 2.8 01/11/2019 11:14 AM   CHOLHDL 5.9 12/18/2011 05:05 AM   LDLCALC 35 01/11/2019 11:14 AM   LDLCALC 119 (H) 12/15/2012 10:07 AM    Wt Readings from Last 3 Encounters:  07/12/19 195 lb (88.5 kg)  01/11/19 203 lb 9.6 oz (92.4 kg)  12/07/18 203 lb (92.1 kg)     Objective:    Vital Signs:  Wt 195 lb (88.5 kg)   BMI 28.80 kg/m   NA  ASSESSMENT & PLAN:    CAD - The patient has no new sypmtoms.  No further cardiovascular testing is indicated.  We will continue with aggressive risk reduction and meds as listed.  Hyperlipidemia -  His LDL is excellent at 35.  No change in therapy.   Tobacco abuse -  He is unable to quit tobacco even after using Chantix.   Ischemic cardiomyopathy - EF was low normal(50 - 55%)with regional wall motion abnormalities in 2017. No further testing.    COVID-19 Education: The signs and symptoms of  COVID-19 were discussed with the patient and how to seek care for testing (follow up with PCP or arrange E-visit).  The importance of social distancing was discussed today.   Time:   Today, I have spent 16 minutes with the patient with telehealth technology discussing the above problems.     Medication Adjustments/Labs and Tests Ordered: Current medicines are reviewed at length with the patient today.  Concerns regarding medicines are outlined above.   Tests Ordered: No orders of the defined types were placed in this encounter.   Medication Changes: No orders of the defined types were placed in this encounter.   Follow Up:  In Person in [place] in one year.   Signed, Minus Breeding, MD  07/12/2019 10:05 AM    [place]

## 2019-07-12 ENCOUNTER — Telehealth (INDEPENDENT_AMBULATORY_CARE_PROVIDER_SITE_OTHER): Payer: Medicare HMO | Admitting: Cardiology

## 2019-07-12 ENCOUNTER — Encounter: Payer: Self-pay | Admitting: Cardiology

## 2019-07-12 VITALS — Wt 195.0 lb

## 2019-07-12 DIAGNOSIS — E785 Hyperlipidemia, unspecified: Secondary | ICD-10-CM | POA: Diagnosis not present

## 2019-07-12 DIAGNOSIS — Z720 Tobacco use: Secondary | ICD-10-CM | POA: Diagnosis not present

## 2019-07-12 DIAGNOSIS — I255 Ischemic cardiomyopathy: Secondary | ICD-10-CM

## 2019-07-12 DIAGNOSIS — I25119 Atherosclerotic heart disease of native coronary artery with unspecified angina pectoris: Secondary | ICD-10-CM

## 2019-07-12 NOTE — Patient Instructions (Signed)
Medication Instructions:  No change *If you need a refill on your cardiac medications before your next appointment, please call your pharmacy*  Lab Work: None  Testing/Procedures: None  Follow-Up: At [facility], you and your health needs are our priority.  As part of our continuing mission to provide you with exceptional heart care, we have created designated Provider Care Teams.  These Care Teams include your primary Cardiologist (physician) and Advanced Practice Providers (APPs -  Physician Assistants and Nurse Practitioners) who all work together to provide you with the care you need, when you need it.  Your next appointment:   1 year(s)  The format for your next appointment:   In Person  Provider:   Minus Breeding, MD

## 2019-08-22 DIAGNOSIS — Z720 Tobacco use: Secondary | ICD-10-CM | POA: Diagnosis not present

## 2019-08-22 DIAGNOSIS — C320 Malignant neoplasm of glottis: Secondary | ICD-10-CM | POA: Diagnosis not present

## 2019-09-18 ENCOUNTER — Other Ambulatory Visit: Payer: Self-pay | Admitting: Cardiovascular Disease

## 2019-10-18 ENCOUNTER — Other Ambulatory Visit: Payer: Self-pay | Admitting: Cardiovascular Disease

## 2019-11-17 ENCOUNTER — Other Ambulatory Visit: Payer: Self-pay | Admitting: Cardiology

## 2019-12-15 ENCOUNTER — Other Ambulatory Visit: Payer: Self-pay | Admitting: Family Medicine

## 2019-12-15 DIAGNOSIS — J439 Emphysema, unspecified: Secondary | ICD-10-CM

## 2019-12-15 DIAGNOSIS — Z720 Tobacco use: Secondary | ICD-10-CM

## 2020-01-31 ENCOUNTER — Emergency Department (HOSPITAL_COMMUNITY): Payer: Medicare HMO

## 2020-01-31 ENCOUNTER — Emergency Department (HOSPITAL_COMMUNITY)
Admission: EM | Admit: 2020-01-31 | Discharge: 2020-02-01 | Disposition: A | Discharge status: Home / Self Care | Payer: Medicare HMO | Attending: Emergency Medicine | Admitting: Emergency Medicine

## 2020-01-31 ENCOUNTER — Other Ambulatory Visit: Payer: Self-pay

## 2020-01-31 ENCOUNTER — Encounter (HOSPITAL_COMMUNITY): Payer: Self-pay

## 2020-01-31 DIAGNOSIS — C329 Malignant neoplasm of larynx, unspecified: Secondary | ICD-10-CM | POA: Diagnosis not present

## 2020-01-31 DIAGNOSIS — I25110 Atherosclerotic heart disease of native coronary artery with unstable angina pectoris: Secondary | ICD-10-CM | POA: Insufficient documentation

## 2020-01-31 DIAGNOSIS — Z7982 Long term (current) use of aspirin: Secondary | ICD-10-CM | POA: Diagnosis not present

## 2020-01-31 DIAGNOSIS — Z79899 Other long term (current) drug therapy: Secondary | ICD-10-CM | POA: Insufficient documentation

## 2020-01-31 DIAGNOSIS — F17210 Nicotine dependence, cigarettes, uncomplicated: Secondary | ICD-10-CM | POA: Insufficient documentation

## 2020-01-31 DIAGNOSIS — R590 Localized enlarged lymph nodes: Secondary | ICD-10-CM | POA: Diagnosis not present

## 2020-01-31 DIAGNOSIS — Z20822 Contact with and (suspected) exposure to covid-19: Secondary | ICD-10-CM | POA: Insufficient documentation

## 2020-01-31 DIAGNOSIS — I509 Heart failure, unspecified: Secondary | ICD-10-CM | POA: Insufficient documentation

## 2020-01-31 DIAGNOSIS — Z9079 Acquired absence of other genital organ(s): Secondary | ICD-10-CM | POA: Insufficient documentation

## 2020-01-31 DIAGNOSIS — J449 Chronic obstructive pulmonary disease, unspecified: Secondary | ICD-10-CM | POA: Diagnosis not present

## 2020-01-31 DIAGNOSIS — J439 Emphysema, unspecified: Secondary | ICD-10-CM | POA: Diagnosis not present

## 2020-01-31 DIAGNOSIS — J392 Other diseases of pharynx: Secondary | ICD-10-CM | POA: Diagnosis not present

## 2020-01-31 DIAGNOSIS — C61 Malignant neoplasm of prostate: Secondary | ICD-10-CM | POA: Insufficient documentation

## 2020-01-31 DIAGNOSIS — R599 Enlarged lymph nodes, unspecified: Secondary | ICD-10-CM | POA: Diagnosis not present

## 2020-01-31 DIAGNOSIS — J029 Acute pharyngitis, unspecified: Secondary | ICD-10-CM | POA: Diagnosis not present

## 2020-01-31 DIAGNOSIS — R591 Generalized enlarged lymph nodes: Secondary | ICD-10-CM

## 2020-01-31 LAB — GROUP A STREP BY PCR: Group A Strep by PCR: NOT DETECTED

## 2020-01-31 LAB — SARS CORONAVIRUS 2 BY RT PCR (HOSPITAL ORDER, PERFORMED IN ~~LOC~~ HOSPITAL LAB): SARS Coronavirus 2: NEGATIVE

## 2020-01-31 MED ORDER — BENZOCAINE 20 % MT AERO
INHALATION_SPRAY | Freq: Once | OROMUCOSAL | Status: AC
  Administered 2020-01-31: 1 via OROMUCOSAL
  Filled 2020-01-31: qty 57

## 2020-01-31 NOTE — ED Provider Notes (Addendum)
[facility] EMERGENCY DEPARTMENT Provider Note   CSN: [id number] Arrival date & time: 01/31/20  8119     History Chief Complaint  Patient presents with  . Sore Throat  . Chest Pain    [name] is a 69 y.o. male who presents emergency department chief complaint of sore throat and tonsillar adenopathy. He had onset of sore throat and lymphadenopathy starting 2 days ago. He has pain with swallowing. Because of his previous history of throat cancer he felt he needed to come for evaluation. He denies fevers, chills. He has not had his Covid vaccination. He is able to tolerate fluids. He has had some chest pain on and off but has had no active chest pain today.  HPI     Past Medical History:  Diagnosis Date  . CAD (coronary artery disease) 12/2008   a. cath 11/2011 s/p DES to LAD b. anterior STEMI 09/04/2014 cath DES to occluded prox LAD, 40% AV groove LCx and 30% mid RCA  . Cancer [facility]) 06/2012   prostate, larynx  . CHF (congestive heart failure) ([place])   . COPD (chronic obstructive pulmonary disease) ([place])   . CVA (cerebral infarction) 06/2009  . Elevated PSA 02/2012   6.54  . Hyperlipidemia    intolerant to statins  . Myocardial infarction ([place])   . PVD (peripheral vascular disease) ([place])    Aortobifemoral Bypass 2010, Thrombectomy and repair right brachial artery   . Radiation 08/10/2012-09/22/2012   6525 cGy to larynx  . Stroke ([place])   . Tobacco abuse   . Tubular adenoma   . Unstable angina [facility])     Patient Active Problem List   Diagnosis Date Noted  . COPD (chronic obstructive pulmonary disease) ([place]) 01/11/2019  . Coronary artery disease involving native coronary artery of native heart with angina pectoris ([place]) 12/07/2018  . S/P prostatectomy 01/19/2017  . History of prostate cancer 01/19/2017  . PAD (peripheral artery disease) ([place]) 08/05/2016  . SUI (stress urinary incontinence), male 08/03/2016  . Thrombosis of aortobifemoral bypass graft ([place]) 01/28/2016  .  History of stroke with residual deficit 01/03/2016  . Cardiomyopathy, ischemic 09/06/2014  . Skin lesion of scalp 06/23/2013  . Prostate cancer ([place]) 09/13/2012  . Squamous cell carcinoma of larynx ([place]) 09/13/2012  . Hyperlipidemia   . Tobacco abuse   . Unstable angina ([place]) 12/17/2011    Past Surgical History:  Procedure Laterality Date  . Aortobifemoral bypass  2010  . COLONOSCOPY W/ BIOPSIES    . CORONARY ANGIOPLASTY WITH STENT PLACEMENT  11/2011  . larynx biopsy  07/12/12  . LEFT HEART CATHETERIZATION WITH CORONARY ANGIOGRAM N/A 12/17/2011   Procedure: LEFT HEART CATHETERIZATION WITH CORONARY ANGIOGRAM;  Surgeon: Hillary Bow, MD;  Location: [facility] CATH LAB;  Service: Cardiovascular;  Laterality: N/A;  . LEFT HEART CATHETERIZATION WITH CORONARY ANGIOGRAM N/A 09/04/2014   Procedure: LEFT HEART CATHETERIZATION WITH CORONARY ANGIOGRAM;  Surgeon: Troy Sine, MD;  Location: [facility] CATH LAB;  Service: Cardiovascular;  Laterality: N/A;  . LOWER EXTREMITY ANGIOGRAPHY Left 09/14/2016   Procedure: Lower Extremity Angiography;  Surgeon: Lorretta Harp, MD;  Location: [place] CV LAB;  Service: Cardiovascular;  Laterality: Left;  . PERCUTANEOUS CORONARY STENT INTERVENTION (PCI-S) N/A 12/17/2011   Procedure: PERCUTANEOUS CORONARY STENT INTERVENTION (PCI-S);  Surgeon: Hillary Bow, MD;  Location: [facility] CATH LAB;  Service: Cardiovascular;  Laterality: N/A;  . PROSTATE BIOPSY  07/12/12   Gleason's grade 3+3+6  . PROSTATECTOMY  12/2015  . Thrombectomy and brachial  artery repair     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       Family History  Problem Relation Age of Onset  . Coronary artery disease Father 58  . Heart disease Father   . Heart attack Father 28  . Coronary artery disease Brother 41       died with AMI  . Heart disease Brother   . Breast cancer Mother   . Cancer Sister        uterine  . Stroke Maternal Grandmother   . Cancer Paternal Grandmother    . Healthy Daughter   . Healthy Son   . Heart attack Brother   . Healthy Son     Social History   Tobacco Use  . Smoking status: Current Every Day Smoker    Packs/day: 0.50    Years: 51.00    Pack years: 25.50    Types: Cigarettes    Start date: 10/07/1967  . Smokeless tobacco: Never Used  Vaping Use  . Vaping Use: Never used  Substance Use Topics  . Alcohol use: No    Alcohol/week: 0.0 standard drinks  . Drug use: No    Home Medications Prior to Admission medications   Medication Sig Start Date End Date Taking? Authorizing Provider  albuterol (PROAIR HFA) 108 (90 Base) MCG/ACT inhaler Inhale 1-2 puffs into the lungs every 6 (six) hours as needed for wheezing or shortness of breath. 01/11/19   Dettinger, Fransisca Kaufmann, MD  aspirin EC 81 MG tablet Take 1 tablet (81 mg total) by mouth daily. 09/06/14   Almyra Deforest, PA  BRILINTA 90 MG TABS tablet TAKE  (1)  TABLET TWICE A DAY. 10/19/19   Minus Breeding, MD  Evolocumab (REPATHA SURECLICK) 562 MG/ML SOAJ Inject 140 mg as directed every 14 (fourteen) days. 04/03/19   Minus Breeding, MD  ezetimibe (ZETIA) 10 MG tablet Take 1 tablet (10 mg total) by mouth daily. 09/06/14   Almyra Deforest, PA  fluocinonide cream (LIDEX) 5.63 % Apply 1 application topically 2 (two) times daily as needed.    [provider]  isosorbide mononitrate (IMDUR) 30 MG 24 hr tablet Take 1 tablet (30 mg total) by mouth daily. 02/20/15   Minus Breeding, MD  metoprolol tartrate (LOPRESSOR) 25 MG tablet Take 0.5 tablets (12.5 mg total) by mouth 2 (two) times daily. 09/06/14   Almyra Deforest, PA  nitroGLYCERIN (NITROSTAT) 0.4 MG SL tablet Place 1 tablet (0.4 mg total) under the tongue every 5 (five) minutes as needed for chest pain (up to 3 doses). 06/02/18 07/30/20  Dettinger, Fransisca Kaufmann, MD  tiotropium (SPIRIVA HANDIHALER) 18 MCG inhalation capsule Place 1 capsule (18 mcg total) into inhaler and inhale daily. Needs to be seen for further refills. 12/15/19   Dettinger, Fransisca Kaufmann, MD     Allergies    Contrast media [iodinated diagnostic agents], Statins, and Other  Review of Systems   Review of Systems  Physical Exam Updated Vital Signs BP 125/90 (BP Location: Right Arm)   Pulse 60   Temp 98.1 F (36.7 C) (Oral)   Resp 18   Ht 5\' 9"  (1.753 m)   Wt 88 kg   SpO2 96%   BMI 28.65 kg/m   Physical Exam Vitals and nursing note reviewed.  Constitutional:      General: He is not in acute distress.    Appearance: He is well-developed. He is not ill-appearing or diaphoretic.  HENT:     Head: Normocephalic and atraumatic.  Mouth/Throat:     Pharynx: Oropharynx is clear. Uvula midline. Posterior oropharyngeal erythema present. No uvula swelling.     Tonsils: No tonsillar exudate or tonsillar abscesses.  Eyes:     General: No scleral icterus.    Conjunctiva/sclera: Conjunctivae normal.  Cardiovascular:     Rate and Rhythm: Normal rate and regular rhythm.     Heart sounds: Normal heart sounds.  Pulmonary:     Effort: Pulmonary effort is normal. No respiratory distress.     Breath sounds: Normal breath sounds.  Abdominal:     Palpations: Abdomen is soft.     Tenderness: There is no abdominal tenderness.  Musculoskeletal:     Cervical back: Normal range of motion and neck supple.  Lymphadenopathy:     Head:     Right side of head: Tonsillar adenopathy present.     Left side of head: Tonsillar adenopathy present.  Skin:    General: Skin is warm and dry.  Neurological:     Mental Status: He is alert.  Psychiatric:        Behavior: Behavior normal.     ED Results / Procedures / Treatments   Labs (all labs ordered are listed, but only abnormal results are displayed) Labs Reviewed  SARS CORONAVIRUS 2 BY RT PCR (HOSPITAL ORDER, [place] [facility])  GROUP A STREP BY PCR    EKG None  Radiology CT Soft Tissue Neck Wo Contrast  Result Date: 01/31/2020 CLINICAL DATA:  Cervical lymphadenopathy. History of laryngeal squamous cell  carcinoma EXAM: CT NECK WITHOUT CONTRAST TECHNIQUE: Multidetector CT imaging of the neck was performed following the standard protocol without intravenous contrast. COMPARISON:  None. FINDINGS: PHARYNX AND LARYNX: The nasopharynx, oropharynx and larynx are normal. Visible portions of the oral cavity, tongue base and floor of mouth are normal. Normal epiglottis, vallecula and pyriform sinuses. The larynx is normal. No retropharyngeal abscess, effusion or lymphadenopathy. Slightly asymmetric left oropharyngeal fullness is unchanged. SALIVARY GLANDS: Normal parotid, submandibular and sublingual glands. THYROID: Normal. LYMPH NODES: No enlarged or abnormal density lymph nodes. VASCULAR: Major cervical vessels are patent. LIMITED INTRACRANIAL: Normal. VISUALIZED ORBITS: Normal. MASTOIDS AND VISUALIZED PARANASAL SINUSES: No fluid levels or advanced mucosal thickening. No mastoid effusion. SKELETON: No bony spinal canal stenosis. No lytic or blastic lesions. UPPER CHEST: Biapical emphysema OTHER: None. IMPRESSION: 1. No cervical lymphadenopathy. 2. No laryngeal mass. Unchanged asymmetric fullness of the left oropharyngeal soft tissues. Given history of laryngeal carcinoma, laryngoscopic visualization should be considered. 3. Emphysema (ICD10-J43.9). Electronically Signed   By: Ulyses Jarred M.D.   On: 01/31/2020 23:28    Procedures Procedures (including critical care time)  Medications Ordered in ED Medications  Benzocaine (HURRCAINE) 20 % mouth spray (1 application Mouth/Throat Given 01/31/20 2248)    ED Course  I have reviewed the triage vital signs and the nursing notes.  Pertinent labs & imaging results that were available during my care of the patient were reviewed by me and considered in my medical decision making (see chart for details).    MDM Rules/Calculators/A&P                          69 year old male who is a current daily smoker with a past medical history of prostate and throat cancer  presents with 1 day symptom of sore throat and tonsillar lymphadenopathy worse on the right side.  I ordered group A strep by PCR and Covid testing which is negative.  The patient has a history of contrast allergy and so I opted to image with a noncontrast CT scan.  Has no obvious collection of fluid or significant mass noted on noncontrast CT images which I personally reviewed and interpreted. Patient is tolerating his own saliva.  No evidence of deep space infection or peritonsillar abscess.  No exudates or tonsillar hypertrophy.  As recommended by the radiologist patient will need to follow-up with ENT for laryngoscopy given his previous history of throat cancer.  Patient appears otherwise appropriate for discharge at this time.  He may take Motrin and Tylenol for pain.  This is most likely a viral infection however we will have him follow closely in the outpatient setting with ENT.  Discussed return precautions. Final Clinical Impression(s) / ED Diagnoses Final diagnoses:  None    Rx / DC Orders ED Discharge Orders    None       Margarita Mail, PA-C 83/09/40 [id number]    Delora Fuel, MD 88/11/03 [place], Delia, PA-C 15/94/58 5929    Delora Fuel, MD 24/46/28 2241

## 2020-01-31 NOTE — ED Triage Notes (Signed)
Pt to er, pt states that starting today he has been having some swollen lymph nodes in his neck, states that he has a hx of prostate and throat cancer.  Pt states that he has also had some chest pain off and on for the past few weeks.  Pt states that it hurts to swallow, states that it isn't his throat that hurts, but off to the side.

## 2020-01-31 NOTE — Discharge Instructions (Addendum)
Your strep and Covid test were negative.  Due to your history of throat cancer you will need to follow up with an ENT doctor. You may follow up with Dr. Redmond Baseman or if you have an ENT - continue care with that doctor.  This is most likely a viral infection given the sudden onset of swelling and pain. Contact a health care provider if you have: Swelling that gets worse or spreads to other areas. Problems with breathing. Lymph glands that: Are still swollen after 2 weeks. Have suddenly gotten bigger. Are red, painful, or hard. A fever or chills. Fatigue. A sore throat. Pain in your abdomen. Weight loss. Night sweats. Get help right away if you have: Fluid leaking from an enlarged lymph gland. Severe pain. Chest pain. Shortness of breath.

## 2020-02-06 DIAGNOSIS — K1120 Sialoadenitis, unspecified: Secondary | ICD-10-CM | POA: Diagnosis not present

## 2020-02-09 ENCOUNTER — Other Ambulatory Visit: Payer: Self-pay | Admitting: Cardiology

## 2020-02-14 ENCOUNTER — Ambulatory Visit (INDEPENDENT_AMBULATORY_CARE_PROVIDER_SITE_OTHER): Payer: Medicare HMO | Admitting: Family Medicine

## 2020-02-14 ENCOUNTER — Encounter: Payer: Self-pay | Admitting: Family Medicine

## 2020-02-14 ENCOUNTER — Other Ambulatory Visit: Payer: Self-pay

## 2020-02-14 VITALS — BP 124/85 | HR 58 | Temp 97.8°F | Ht 69.0 in | Wt 199.0 lb

## 2020-02-14 DIAGNOSIS — J439 Emphysema, unspecified: Secondary | ICD-10-CM | POA: Diagnosis not present

## 2020-02-14 DIAGNOSIS — Z8546 Personal history of malignant neoplasm of prostate: Secondary | ICD-10-CM

## 2020-02-14 DIAGNOSIS — K119 Disease of salivary gland, unspecified: Secondary | ICD-10-CM

## 2020-02-14 DIAGNOSIS — E782 Mixed hyperlipidemia: Secondary | ICD-10-CM | POA: Diagnosis not present

## 2020-02-14 NOTE — Progress Notes (Signed)
BP 124/85   Pulse (!) 58   Temp 97.8 F (36.6 C)   Ht _0  (1.753 m)   Wt 199 lb (90.3 kg)   SpO2 97%   BMI 29.39 kg/m    Subjective:   Patient ID: [name], male    DOB: [birth year], 68 y.o.   MRN: [id number]  HPI: [name] is a 69 y.o. male presenting on 02/14/2020 for Right neck swollen (seen ENT. Was told larg salivary gland)   HPI Patient is coming in today for follow-up on his neck swelling.  He had some right-sided neck swelling and glands that he was seen by ENT and they lanced 3 one of his large salivary glands and he says it is improving, still slightly swollen but much improved and the pain is all but gone.  Patient still has follow-up with them and he says things are going better with the gland.  Hyperlipidemia Patient is coming in for recheck of his hyperlipidemia. The patient is currently taking Zetia and Repatha. They deny any issues with myalgias or history of liver damage from it. They deny any focal numbness or weakness or chest pain.   COPD Patient is coming in for COPD recheck today.  He is currently on albuterol and Spiriva.  He has a mild chronic cough but denies any major coughing spells or wheezing spells.  He has 2nighttime symptoms per week and 5daytime symptoms per week currently.  Patient always has cough and wheezing chronically  Relevant past medical, surgical, family and social history reviewed and updated as indicated. Interim medical history since our last visit reviewed. Allergies and medications reviewed and updated.  Review of Systems  Constitutional: Negative for chills and fever.  HENT: Positive for facial swelling.   Eyes: Negative for visual disturbance.  Respiratory: Positive for cough and wheezing. Negative for shortness of breath.   Cardiovascular: Negative for chest pain and leg swelling.  Musculoskeletal: Negative for back pain and gait problem.  Skin: Negative for rash.  All other systems reviewed and are  negative.   Per HPI unless specifically indicated above   Allergies as of 02/14/2020      Reactions   Contrast Media [iodinated Diagnostic Agents]    Statins Other (See Comments)   Muscle pain   Other Itching, Swelling, Dermatitis   IV dye      Medication List       Accurate as of February 14, 2020  9:55 AM. If you have any questions, ask your nurse or doctor.        albuterol 108 (90 Base) MCG/ACT inhaler Commonly known as: ProAir HFA Inhale 1-2 puffs into the lungs every 6 (six) hours as needed for wheezing or shortness of breath.   aspirin EC 81 MG tablet Take 1 tablet (81 mg total) by mouth daily.   Brilinta 90 MG Tabs tablet Generic drug: ticagrelor TAKE  (1)  TABLET TWICE A DAY.   ezetimibe 10 MG tablet Commonly known as: ZETIA Take 1 tablet (10 mg total) by mouth daily.   fluocinonide cream 0.05 % Commonly known as: LIDEX Apply 1 application topically 2 (two) times daily as needed.   isosorbide mononitrate 30 MG 24 hr tablet Commonly known as: IMDUR Take 1 tablet (30 mg total) by mouth daily.   metoprolol tartrate 25 MG tablet Commonly known as: LOPRESSOR Take 0.5 tablets (12.5 mg total) by mouth 2 (two) times daily.   nitroGLYCERIN 0.4 MG SL tablet Commonly known as: NITROSTAT Place 1  tablet (0.4 mg total) under the tongue every 5 (five) minutes as needed for chest pain (up to 3 doses).   Repatha SureClick 794 MG/ML Soaj Generic drug: Evolocumab Inject 140 mg as directed every 14 (fourteen) days.   Spiriva HandiHaler 18 MCG inhalation capsule Generic drug: tiotropium Place 1 capsule (18 mcg total) into inhaler and inhale daily. Needs to be seen for further refills.        Objective:   BP 124/85   Pulse (!) 58   Temp 97.8 F (36.6 C)   Ht _0  (1.753 m)   Wt 199 lb (90.3 kg)   SpO2 97%   BMI 29.39 kg/m   Wt Readings from Last 3 Encounters:  02/14/20 199 lb (90.3 kg)  01/31/20 194 lb (88 kg)  07/12/19 195 lb (88.5 kg)    Physical  Exam Vitals and nursing note reviewed.  Constitutional:      General: He is not in acute distress.    Appearance: He is well-developed. He is not diaphoretic.  Eyes:     General: No scleral icterus.    Conjunctiva/sclera: Conjunctivae normal.  Neck:     Thyroid: No thyromegaly.  Cardiovascular:     Rate and Rhythm: Normal rate and regular rhythm.     Heart sounds: Normal heart sounds. No murmur heard.   Pulmonary:     Effort: Pulmonary effort is normal. No respiratory distress.     Breath sounds: Wheezing present.  Musculoskeletal:        General: Normal range of motion.     Cervical back: Neck supple.  Lymphadenopathy:     Cervical: No cervical adenopathy.  Skin:    General: Skin is warm and dry.     Findings: No rash.  Neurological:     Mental Status: He is alert and oriented to person, place, and time.     Coordination: Coordination normal.  Psychiatric:        Behavior: Behavior normal.       Assessment & Plan:   Problem List Items Addressed This Visit      Respiratory   COPD (chronic obstructive pulmonary disease) ([place])   Relevant Orders   CBC with Differential/Platelet (Completed)   CMP14+EGFR (Completed)     Other   Hyperlipidemia - Primary   Relevant Orders   Lipid panel (Completed)   History of prostate cancer   Relevant Orders   PSA, total and free (Completed)    Other Visit Diagnoses    Salivary gland disorder       Doing much better, sounds like it is fixed by ENT   Relevant Orders   CBC with Differential/Platelet (Completed)   CMP14+EGFR (Completed)      Patient has been stable on his COPD, still has wheezing but has been stable, continue current medication.  Improving on salivary gland disorder and sounds like ENT was able to fix it we will monitor in the future. Follow up plan: Return in about 6 months (around 08/16/2020), or if symptoms worsen or fail to improve, for COPD and hyperlipidemia and history of prostate cancer.  Counseling  provided for all of the vaccine components Orders Placed This Encounter  Procedures  . CBC with Differential/Platelet  . CMP14+EGFR  . Lipid panel  . PSA, total and free    Caryl Pina, MD [facility] 02/14/2020, 9:55 AM

## 2020-02-15 LAB — PSA, TOTAL AND FREE
PSA, Free: <0.01 ng/mL
Prostate Specific Ag, Serum: <0.1 ng/mL (ref 0.0–4.0)

## 2020-02-15 LAB — CMP14+EGFR
ALT: 16 IU/L (ref 0–44)
AST: 16 IU/L (ref 0–40)
Albumin/Globulin Ratio: 1.9 (ref 1.2–2.2)
Albumin: 4.3 g/dL (ref 3.8–4.8)
Alkaline Phosphatase: 92 IU/L (ref 48–121)
BUN/Creatinine Ratio: 13 (ref 10–24)
BUN: 13 mg/dL (ref 8–27)
Bilirubin Total: 0.4 mg/dL (ref 0.0–1.2)
CO2: 24 mmol/L (ref 20–29)
Calcium: 9.2 mg/dL (ref 8.6–10.2)
Chloride: 104 mmol/L (ref 96–106)
Creatinine, Ser: 1.02 mg/dL (ref 0.76–1.27)
GFR calc Af Amer: 88 mL/min/{1.73_m2} (ref >59)
GFR calc non Af Amer: 76 mL/min/{1.73_m2} (ref >59)
Globulin, Total: 2.3 g/dL (ref 1.5–4.5)
Glucose: 89 mg/dL (ref 65–99)
Potassium: 4.3 mmol/L (ref 3.5–5.2)
Sodium: 141 mmol/L (ref 134–144)
Total Protein: 6.6 g/dL (ref 6.0–8.5)

## 2020-02-15 LAB — CBC WITH DIFFERENTIAL/PLATELET
Basophils Absolute: 0.1 x10E3/uL (ref 0.0–0.2)
Basos: 1 %
EOS (ABSOLUTE): 0.1 x10E3/uL (ref 0.0–0.4)
Eos: 2 %
Hematocrit: 47.1 % (ref 37.5–51.0)
Hemoglobin: 15.9 g/dL (ref 13.0–17.7)
Immature Grans (Abs): 0 x10E3/uL (ref 0.0–0.1)
Immature Granulocytes: 0 %
Lymphocytes Absolute: 1.7 x10E3/uL (ref 0.7–3.1)
Lymphs: 24 %
MCH: 31.3 pg (ref 26.6–33.0)
MCHC: 33.8 g/dL (ref 31.5–35.7)
MCV: 93 fL (ref 79–97)
Monocytes Absolute: 0.5 x10E3/uL (ref 0.1–0.9)
Monocytes: 7 %
Neutrophils Absolute: 4.7 x10E3/uL (ref 1.4–7.0)
Neutrophils: 66 %
Platelets: 215 x10E3/uL (ref 150–450)
RBC: 5.08 x10E6/uL (ref 4.14–5.80)
RDW: 13.2 % (ref 11.6–15.4)
WBC: 7.1 x10E3/uL (ref 3.4–10.8)

## 2020-02-15 LAB — LIPID PANEL
Chol/HDL Ratio: 3.5 ratio (ref 0.0–5.0)
Cholesterol, Total: 139 mg/dL (ref 100–199)
HDL: 40 mg/dL
LDL Chol Calc (NIH): 66 mg/dL (ref 0–99)
Triglycerides: 202 mg/dL — ABNORMAL HIGH (ref 0–149)
VLDL Cholesterol Cal: 33 mg/dL (ref 5–40)

## 2020-03-06 ENCOUNTER — Ambulatory Visit (INDEPENDENT_AMBULATORY_CARE_PROVIDER_SITE_OTHER): Payer: Medicare HMO | Admitting: Family Medicine

## 2020-03-06 ENCOUNTER — Encounter: Payer: Self-pay | Admitting: Family Medicine

## 2020-03-06 ENCOUNTER — Other Ambulatory Visit: Payer: Self-pay

## 2020-03-06 VITALS — BP 111/67 | HR 57 | Temp 97.7°F | Ht 68.5 in | Wt 201.4 lb

## 2020-03-06 DIAGNOSIS — Z0000 Encounter for general adult medical examination without abnormal findings: Secondary | ICD-10-CM

## 2020-03-06 NOTE — Progress Notes (Signed)
BP 111/67   Pulse (!) 57   Temp 97.7 F (36.5 C) (Temporal)   Ht 5' 8.5" (1.74 m)   Wt 201 lb 6.4 oz (91.4 kg)   BMI 30.18 kg/m    Subjective:   Patient ID: [name], male    DOB: [birth year], 69 y.o.   MRN: [id number]  HPI: [name] is a 69 y.o. male presenting on 03/06/2020 for Annual Exam   HPI Well adult exam and physical Patient is coming in for well adult exam and physical today.  He had blood work done just a few weeks ago and everything looked good except the triglycerides.  He is already being followed and managed for this.  His PSA looks good. Patient denies any chest pain, shortness of breath, headaches or vision issues, abdominal complaints, diarrhea, nausea, vomiting, or joint issues.   Relevant past medical, surgical, family and social history reviewed and updated as indicated. Interim medical history since our last visit reviewed. Allergies and medications reviewed and updated.  Review of Systems  Constitutional: Negative for chills and fever.  HENT: Negative for ear pain and tinnitus.   Eyes: Negative for pain.  Respiratory: Negative for cough, shortness of breath and wheezing.   Cardiovascular: Negative for chest pain, palpitations and leg swelling.  Gastrointestinal: Negative for abdominal pain, blood in stool, constipation and diarrhea.  Genitourinary: Negative for dysuria and hematuria.  Musculoskeletal: Negative for back pain, gait problem and myalgias.  Skin: Negative for rash.  Neurological: Negative for dizziness, weakness, light-headedness and headaches.  Psychiatric/Behavioral: Negative for suicidal ideas.  All other systems reviewed and are negative.   Per HPI unless specifically indicated above   Allergies as of 03/06/2020      Reactions   Contrast Media [iodinated Diagnostic Agents]    Statins Other (See Comments)   Muscle pain   Other Itching, Swelling, Dermatitis   IV dye      Medication List       Accurate as of March 06, 2020  9:46 AM. If you have any questions, ask your nurse or doctor.        albuterol 108 (90 Base) MCG/ACT inhaler Commonly known as: ProAir HFA Inhale 1-2 puffs into the lungs every 6 (six) hours as needed for wheezing or shortness of breath.   aspirin EC 81 MG tablet Take 1 tablet (81 mg total) by mouth daily.   Brilinta 90 MG Tabs tablet Generic drug: ticagrelor TAKE  (1)  TABLET TWICE A DAY.   ezetimibe 10 MG tablet Commonly known as: ZETIA Take 1 tablet (10 mg total) by mouth daily.   fluocinonide cream 0.05 % Commonly known as: LIDEX Apply 1 application topically 2 (two) times daily as needed.   isosorbide mononitrate 30 MG 24 hr tablet Commonly known as: IMDUR Take 1 tablet (30 mg total) by mouth daily.   metoprolol tartrate 25 MG tablet Commonly known as: LOPRESSOR Take 0.5 tablets (12.5 mg total) by mouth 2 (two) times daily.   nitroGLYCERIN 0.4 MG SL tablet Commonly known as: NITROSTAT Place 1 tablet (0.4 mg total) under the tongue every 5 (five) minutes as needed for chest pain (up to 3 doses).   Repatha SureClick 438 MG/ML Soaj Generic drug: Evolocumab Inject 140 mg as directed every 14 (fourteen) days.   Spiriva HandiHaler 18 MCG inhalation capsule Generic drug: tiotropium Place 1 capsule (18 mcg total) into inhaler and inhale daily. Needs to be seen for further refills.  Objective:   BP 111/67   Pulse (!) 57   Temp 97.7 F (36.5 C) (Temporal)   Ht 5' 8.5" (1.74 m)   Wt 201 lb 6.4 oz (91.4 kg)   BMI 30.18 kg/m   Wt Readings from Last 3 Encounters:  03/06/20 201 lb 6.4 oz (91.4 kg)  02/14/20 199 lb (90.3 kg)  01/31/20 194 lb (88 kg)    Physical Exam Vitals and nursing note reviewed.  Constitutional:      General: He is not in acute distress.    Appearance: He is well-developed. He is not diaphoretic.  HENT:     Right Ear: External ear normal.     Left Ear: External ear normal.     Nose: Nose normal.     Mouth/Throat:      Pharynx: No oropharyngeal exudate.  Eyes:     General: No scleral icterus.       Right eye: No discharge.     Conjunctiva/sclera: Conjunctivae normal.     Pupils: Pupils are equal, round, and reactive to light.  Neck:     Thyroid: No thyromegaly.  Cardiovascular:     Rate and Rhythm: Normal rate and regular rhythm.     Heart sounds: Normal heart sounds. No murmur heard.   Pulmonary:     Effort: Pulmonary effort is normal. No respiratory distress.     Breath sounds: Normal breath sounds. No wheezing.  Abdominal:     General: Bowel sounds are normal. There is no distension.     Palpations: Abdomen is soft.     Tenderness: There is no abdominal tenderness. There is no guarding or rebound.  Musculoskeletal:        General: Normal range of motion.     Cervical back: Neck supple.  Lymphadenopathy:     Cervical: No cervical adenopathy.  Skin:    General: Skin is warm and dry.     Findings: No rash.  Neurological:     Mental Status: He is alert and oriented to person, place, and time.     Coordination: Coordination normal.  Psychiatric:        Behavior: Behavior normal.     Results for orders placed or performed in visit on 02/14/20  CBC with Differential/Platelet  Result Value Ref Range   WBC 7.1 3.4 - 10.8 x10E3/uL   RBC 5.08 4.14 - 5.80 x10E6/uL   Hemoglobin 15.9 13.0 - 17.7 g/dL   Hematocrit 47.1 37.5 - 51.0 %   MCV 93 79 - 97 fL   MCH 31.3 26.6 - 33.0 pg   MCHC 33.8 31 - 35 g/dL   RDW 13.2 11.6 - 15.4 %   Platelets 215 150 - 450 x10E3/uL   Neutrophils 66 Not Estab. %   Lymphs 24 Not Estab. %   Monocytes 7 Not Estab. %   Eos 2 Not Estab. %   Basos 1 Not Estab. %   Neutrophils Absolute 4.7 1 - 7 x10E3/uL   Lymphocytes Absolute 1.7 0 - 3 x10E3/uL   Monocytes Absolute 0.5 0 - 0 x10E3/uL   EOS (ABSOLUTE) 0.1 0.0 - 0.4 x10E3/uL   Basophils Absolute 0.1 0 - 0 x10E3/uL   Immature Granulocytes 0 Not Estab. %   Immature Grans (Abs) 0.0 0.0 - 0.1 x10E3/uL  CMP14+EGFR   Result Value Ref Range   Glucose 89 65 - 99 mg/dL   BUN 13 8 - 27 mg/dL   Creatinine, Ser 1.02 0.76 - 1.27 mg/dL   GFR calc  non Af Amer 76 >59 mL/min/1.73   GFR calc Af Amer 88 >59 mL/min/1.73   BUN/Creatinine Ratio 13 10 - 24   Sodium 141 134 - 144 mmol/L   Potassium 4.3 3.5 - 5.2 mmol/L   Chloride 104 96 - 106 mmol/L   CO2 24 20 - 29 mmol/L   Calcium 9.2 8.6 - 10.2 mg/dL   Total Protein 6.6 6.0 - 8.5 g/dL   Albumin 4.3 3.8 - 4.8 g/dL   Globulin, Total 2.3 1.5 - 4.5 g/dL   Albumin/Globulin Ratio 1.9 1.2 - 2.2   Bilirubin Total 0.4 0.0 - 1.2 mg/dL   Alkaline Phosphatase 92 48 - 121 IU/L   AST 16 0 - 40 IU/L   ALT 16 0 - 44 IU/L  Lipid panel  Result Value Ref Range   Cholesterol, Total 139 100 - 199 mg/dL   Triglycerides 202 (H) 0 - 149 mg/dL   HDL 40 >39 mg/dL   VLDL Cholesterol Cal 33 5 - 40 mg/dL   LDL Chol Calc (NIH) 66 0 - 99 mg/dL   Chol/HDL Ratio 3.5 0.0 - 5.0 ratio  PSA, total and free  Result Value Ref Range   Prostate Specific Ag, Serum <0.1 0.0 - 4.0 ng/mL   PSA, Free <0.01 N/A ng/mL   PSA, Free Pct CANCELED %    Assessment & Plan:   Problem List Items Addressed This Visit    None    Visit Diagnoses    Well adult exam    -  Primary      Blood work looks good from last time, PSA looks good from last time, he will follow-up in 6 months.  He says his breathing is actually doing very well right now. Follow up plan: Return if symptoms worsen or fail to improve.  Counseling provided for all of the vaccine components No orders of the defined types were placed in this encounter.   Caryl Pina, MD [facility] 03/06/2020, 9:46 AM

## 2020-03-27 NOTE — Chronic Care Management (AMB) (Signed)
  Chronic Care Management   Outreach Note  05/19/2019 Name: [name] MRN: [id number] DOB: 12/08/1950  Referred by: Dettinger, Fransisca Kaufmann, MD Reason for referral : Chronic Care Management (CCM follow up)   An unsuccessful telephone outreach was attempted today. The patient was referred to the case management team for assistance with care management and care coordination.   Follow Up Plan: The care management team will reach out to the patient again over the next 45 days.   Chong Sicilian, BSN, RN-BC Embedded Chronic Care Manager [facility] Direct Dial: [phone]

## 2020-04-04 ENCOUNTER — Other Ambulatory Visit: Payer: Self-pay | Admitting: Cardiology

## 2020-04-29 ENCOUNTER — Other Ambulatory Visit: Payer: Self-pay | Admitting: Cardiology

## 2020-05-14 DIAGNOSIS — Z8709 Personal history of other diseases of the respiratory system: Secondary | ICD-10-CM | POA: Diagnosis not present

## 2020-05-14 DIAGNOSIS — R0781 Pleurodynia: Secondary | ICD-10-CM | POA: Diagnosis not present

## 2020-05-14 DIAGNOSIS — R0689 Other abnormalities of breathing: Secondary | ICD-10-CM | POA: Diagnosis not present

## 2020-05-21 DIAGNOSIS — C320 Malignant neoplasm of glottis: Secondary | ICD-10-CM | POA: Diagnosis not present

## 2020-07-19 ENCOUNTER — Telehealth: Payer: Self-pay

## 2020-07-19 NOTE — Telephone Encounter (Signed)
Pt informed and understood

## 2020-07-19 NOTE — Telephone Encounter (Signed)
Unless he has developed new symptoms or the symptoms have worsened then I do not think he has to quarantine again, you can still test positive for quite some time after.  If he has new symptoms or new exposure then he might of caught it again although that is not commonly catching again that quickly.

## 2020-07-19 NOTE — Telephone Encounter (Signed)
Please advise, could patient still be showing positive from first illness?

## 2020-07-19 NOTE — Telephone Encounter (Signed)
Pt says he had COVID 16 days ago and recently took another COVID test and its showing as positive. Pt wants clarification on this and wants to know if he has to quarantine again

## 2020-08-16 ENCOUNTER — Encounter: Payer: Self-pay | Admitting: Family Medicine

## 2020-08-16 ENCOUNTER — Other Ambulatory Visit: Payer: Self-pay

## 2020-08-16 ENCOUNTER — Ambulatory Visit (INDEPENDENT_AMBULATORY_CARE_PROVIDER_SITE_OTHER): Payer: Medicare HMO | Admitting: Family Medicine

## 2020-08-16 VITALS — BP 103/61 | HR 61 | Temp 97.6°F | Ht 68.5 in | Wt 197.6 lb

## 2020-08-16 DIAGNOSIS — J439 Emphysema, unspecified: Secondary | ICD-10-CM

## 2020-08-16 DIAGNOSIS — Z23 Encounter for immunization: Secondary | ICD-10-CM | POA: Diagnosis not present

## 2020-08-16 DIAGNOSIS — Z720 Tobacco use: Secondary | ICD-10-CM | POA: Diagnosis not present

## 2020-08-16 DIAGNOSIS — E782 Mixed hyperlipidemia: Secondary | ICD-10-CM | POA: Diagnosis not present

## 2020-08-16 DIAGNOSIS — I25119 Atherosclerotic heart disease of native coronary artery with unspecified angina pectoris: Secondary | ICD-10-CM

## 2020-08-16 MED ORDER — ALBUTEROL SULFATE HFA 108 (90 BASE) MCG/ACT IN AERS: 1.0000 | INHALATION_SPRAY | Freq: Four times a day (QID) | RESPIRATORY_TRACT | 11 refills | Status: DC | PRN

## 2020-08-16 MED ORDER — SPIRIVA HANDIHALER 18 MCG IN CAPS: 18.0000 ug | ORAL_CAPSULE | Freq: Every day | RESPIRATORY_TRACT | 3 refills | Status: DC

## 2020-08-16 MED ORDER — NITROGLYCERIN 0.4 MG SL SUBL: 0.4000 mg | SUBLINGUAL_TABLET | SUBLINGUAL | 5 refills | Status: AC | PRN

## 2020-08-16 NOTE — Progress Notes (Signed)
 BP 103/61   Pulse 61   Temp 97.6 F (36.4 C) (Temporal)   Ht 5' 8.5" (1.74 m)   Wt 197 lb 9.6 oz (89.6 kg)   BMI 29.61 kg/m    Subjective:   Patient ID: [name], male    DOB: 08/11/1950, 69 y.o.   MRN: [id number]  HPI: [name] is a 69 y.o. male presenting on 08/16/2020 for Medical Management of Chronic Issues (6 mos)   HPI  COPD Patient is coming in for COPD recheck today.  He is currently on Ventolin and Spiriva.  He has a mild chronic cough but denies any major coughing spells or wheezing spells.  He has 1nighttime symptoms per week and 1daytime symptoms per week currently.   Hyperlipidemia Patient is coming in for recheck of his hyperlipidemia. The patient is currently taking Brilinta and Zetia and Repatha. They deny any issues with myalgias or history of liver damage from it. They deny any focal numbness or weakness or chest pain.   Patient has CAD and sees cardiology for this and his cholesterol.  Relevant past medical, surgical, family and social history reviewed and updated as indicated. Interim medical history since our last visit reviewed. Allergies and medications reviewed and updated.  Review of Systems  Constitutional: Negative for chills and fever.  HENT: Negative for congestion.   Eyes: Negative for discharge.  Respiratory: Positive for cough (Occasional cough, sometimes gets abdominal wall muscle spasms, recommended magnesium and potassium). Negative for shortness of breath and wheezing.   Cardiovascular: Negative for chest pain and leg swelling.  Musculoskeletal: Negative for back pain and gait problem.  Skin: Negative for rash.  All other systems reviewed and are negative.   Per HPI unless specifically indicated above   Allergies as of 08/16/2020      Reactions   Contrast Media [iodinated Diagnostic Agents]    Statins Other (See Comments)   Muscle pain   Other Itching, Swelling, Dermatitis   IV dye      Medication List        Accurate as of August 16, 2020 10:01 AM. If you have any questions, ask your nurse or doctor.        albuterol 108 (90 Base) MCG/ACT inhaler Commonly known as: ProAir HFA Inhale 1-2 puffs into the lungs every 6 (six) hours as needed for wheezing or shortness of breath. Place on file until patient asked for prescription What changed: additional instructions Changed by: Joshua A Dettinger, MD   aspirin EC 81 MG tablet Take 1 tablet (81 mg total) by mouth daily.   Brilinta 90 MG Tabs tablet Generic drug: ticagrelor TAKE  (1)  TABLET TWICE A DAY.   ezetimibe 10 MG tablet Commonly known as: ZETIA Take 1 tablet (10 mg total) by mouth daily.   fluocinonide cream 0.05 % Commonly known as: LIDEX Apply 1 application topically 2 (two) times daily as needed.   isosorbide mononitrate 30 MG 24 hr tablet Commonly known as: IMDUR Take 1 tablet (30 mg total) by mouth daily.   metoprolol tartrate 25 MG tablet Commonly known as: LOPRESSOR Take 0.5 tablets (12.5 mg total) by mouth 2 (two) times daily.   nitroGLYCERIN 0.4 MG SL tablet Commonly known as: NITROSTAT Place 1 tablet (0.4 mg total) under the tongue every 5 (five) minutes as needed for chest pain (up to 3 doses).   Repatha SureClick 140 MG/ML Soaj Generic drug: Evolocumab Inject 140 mg as directed every 14 (fourteen) days.   Spiriva HandiHaler   18 MCG inhalation capsule Generic drug: tiotropium Place 1 capsule (18 mcg total) into inhaler and inhale daily. Place on file until patient asked for prescription What changed: additional instructions Changed by: Joshua A Dettinger, MD        Objective:   BP 103/61   Pulse 61   Temp 97.6 F (36.4 C) (Temporal)   Ht 5' 8.5" (1.74 m)   Wt 197 lb 9.6 oz (89.6 kg)   BMI 29.61 kg/m   Wt Readings from Last 3 Encounters:  08/16/20 197 lb 9.6 oz (89.6 kg)  03/06/20 201 lb 6.4 oz (91.4 kg)  02/14/20 199 lb (90.3 kg)    Physical Exam Vitals and nursing note reviewed.   Constitutional:      General: He is not in acute distress.    Appearance: He is well-developed and well-nourished. He is not diaphoretic.  Eyes:     General: No scleral icterus.    Extraocular Movements: EOM normal.     Conjunctiva/sclera: Conjunctivae normal.  Neck:     Thyroid: No thyromegaly.  Cardiovascular:     Rate and Rhythm: Normal rate and regular rhythm.     Pulses: Intact distal pulses.     Heart sounds: Normal heart sounds. No murmur heard.   Pulmonary:     Effort: Pulmonary effort is normal. No respiratory distress.     Breath sounds: Normal breath sounds. No wheezing.  Abdominal:     General: Abdomen is flat. Bowel sounds are normal. There is no distension.     Tenderness: There is no abdominal tenderness. There is no right CVA tenderness, left CVA tenderness, guarding or rebound.     Hernia: A hernia is present. Hernia is present in the umbilical area (Small reducible umbilical hernia).  Musculoskeletal:        General: No edema. Normal range of motion.     Cervical back: Neck supple.  Lymphadenopathy:     Cervical: No cervical adenopathy.  Skin:    General: Skin is warm and dry.     Findings: No rash.  Neurological:     Mental Status: He is alert and oriented to person, place, and time.     Coordination: Coordination normal.  Psychiatric:        Mood and Affect: Mood and affect normal.        Behavior: Behavior normal.       Assessment & Plan:   Problem List Items Addressed This Visit      Cardiovascular and Mediastinum   Coronary artery disease involving native coronary artery of native heart with angina pectoris (HCC)   Relevant Medications   nitroGLYCERIN (NITROSTAT) 0.4 MG SL tablet     Respiratory   COPD (chronic obstructive pulmonary disease) (HCC)   Relevant Medications   tiotropium (SPIRIVA HANDIHALER) 18 MCG inhalation capsule   albuterol (PROAIR HFA) 108 (90 Base) MCG/ACT inhaler   Other Relevant Orders   CBC with  Differential/Platelet     Other   Hyperlipidemia - Primary   Relevant Medications   nitroGLYCERIN (NITROSTAT) 0.4 MG SL tablet   Other Relevant Orders   CMP14+EGFR   Lipid panel   Tobacco abuse   Relevant Medications   tiotropium (SPIRIVA HANDIHALER) 18 MCG inhalation capsule   albuterol (PROAIR HFA) 108 (90 Base) MCG/ACT inhaler    Other Visit Diagnoses    Need for Streptococcus pneumoniae vaccination       Relevant Orders   Pneumococcal conjugate vaccine 13-valent (Completed)        Continue Spiriva and albuterol as needed.  Also continue his blood pressure medicines with cardiology and continue his cholesterol medicines with cardiology as well.  No changes, will check blood work.  He also needs to make a follow-up with cardiology soon. Follow up plan: Return in about 6 months (around 02/13/2021), or if symptoms worsen or fail to improve, for cholesterol.  Counseling provided for all of the vaccine components Orders Placed This Encounter  Procedures  . Pneumococcal conjugate vaccine 13-valent  . CBC with Differential/Platelet  . CMP14+EGFR  . Lipid panel    Caryl Pina, MD [facility] 08/16/2020, 10:01 AM

## 2020-08-17 LAB — CBC WITH DIFFERENTIAL/PLATELET
Basophils Absolute: 0.1 10*3/uL (ref 0.0–0.2)
Basos: 1 %
EOS (ABSOLUTE): 0.1 10*3/uL (ref 0.0–0.4)
Eos: 1 %
Hematocrit: 42.3 % (ref 37.5–51.0)
Hemoglobin: 14.8 g/dL (ref 13.0–17.7)
Immature Grans (Abs): 0 10*3/uL (ref 0.0–0.1)
Immature Granulocytes: 0 %
Lymphocytes Absolute: 2.2 10*3/uL (ref 0.7–3.1)
Lymphs: 31 %
MCH: 31.4 pg (ref 26.6–33.0)
MCHC: 35 g/dL (ref 31.5–35.7)
MCV: 90 fL (ref 79–97)
Monocytes Absolute: 0.5 10*3/uL (ref 0.1–0.9)
Monocytes: 7 %
Neutrophils Absolute: 4.2 10*3/uL (ref 1.4–7.0)
Neutrophils: 60 %
Platelets: 268 10*3/uL (ref 150–450)
RBC: 4.71 x10E6/uL (ref 4.14–5.80)
RDW: 13.4 % (ref 11.6–15.4)
WBC: 7.1 10*3/uL (ref 3.4–10.8)

## 2020-08-17 LAB — CMP14+EGFR
ALT: 18 IU/L (ref 0–44)
AST: 16 IU/L (ref 0–40)
Albumin/Globulin Ratio: 1.5 (ref 1.2–2.2)
Albumin: 4 g/dL (ref 3.8–4.8)
Alkaline Phosphatase: 94 IU/L (ref 44–121)
BUN/Creatinine Ratio: 9 — ABNORMAL LOW (ref 10–24)
BUN: 8 mg/dL (ref 8–27)
Bilirubin Total: 0.4 mg/dL (ref 0.0–1.2)
CO2: 22 mmol/L (ref 20–29)
Calcium: 9.1 mg/dL (ref 8.6–10.2)
Chloride: 105 mmol/L (ref 96–106)
Creatinine, Ser: 0.93 mg/dL (ref 0.76–1.27)
GFR calc Af Amer: 97 mL/min/{1.73_m2} (ref >59)
GFR calc non Af Amer: 84 mL/min/{1.73_m2} (ref >59)
Globulin, Total: 2.6 g/dL (ref 1.5–4.5)
Glucose: 91 mg/dL (ref 65–99)
Potassium: 4.1 mmol/L (ref 3.5–5.2)
Sodium: 141 mmol/L (ref 134–144)
Total Protein: 6.6 g/dL (ref 6.0–8.5)

## 2020-08-17 LAB — LIPID PANEL
Chol/HDL Ratio: 3.9 ratio (ref 0.0–5.0)
Cholesterol, Total: 162 mg/dL (ref 100–199)
HDL: 42 mg/dL (ref >39)
LDL Chol Calc (NIH): 82 mg/dL (ref 0–99)
Triglycerides: 228 mg/dL — ABNORMAL HIGH (ref 0–149)
VLDL Cholesterol Cal: 38 mg/dL (ref 5–40)

## 2020-08-22 ENCOUNTER — Other Ambulatory Visit: Payer: Self-pay | Admitting: Cardiology

## 2020-09-03 ENCOUNTER — Ambulatory Visit (INDEPENDENT_AMBULATORY_CARE_PROVIDER_SITE_OTHER): Payer: Medicare HMO

## 2020-09-03 DIAGNOSIS — Z0000 Encounter for general adult medical examination without abnormal findings: Secondary | ICD-10-CM

## 2020-09-03 NOTE — Progress Notes (Signed)
MEDICARE ANNUAL WELLNESS VISIT  09/03/2020  Telephone Visit Disclaimer This Medicare AWV was conducted by telephone due to national recommendations for restrictions regarding the COVID-19 Pandemic (e.g. social distancing).  I verified, using two identifiers, that I am speaking with [name] or their authorized healthcare agent. I discussed the limitations, risks, security, and privacy concerns of performing an evaluation and management service by telephone and the potential availability of an in-person appointment in the future. The patient expressed understanding and agreed to proceed.  Location of Patient: Home Location of Provider (nurse):  [facility]  Subjective:    [name] is a 70 y.o. male patient of Dettinger, Fransisca Kaufmann, MD who had a Medicare Annual Wellness Visit today via telephone. [name] is Retired and lives with his spouse. He has three children and two grandchildren. He reports that he is socially active and does interact with friends/family regularly. He is minimally physically active and enjoys spending time with family.  Patient Care Team: Dettinger, Fransisca Kaufmann, MD as PCP - General (Family Medicine) Minus Breeding, MD as PCP - Cardiology (Cardiology) Myrlene Broker, MD as Attending Physician (Urology) Dorothe Pea, MD as Referring Physician (Otolaryngology) Minus Breeding, MD as Consulting Physician (Cardiology) Angelia Mould, MD as Consulting Physician (Vascular Surgery) Ilean China, RN as Case Manager  Advanced Directives 09/03/2020 01/31/2020 06/02/2018 05/11/2018 11/04/2017 11/03/2016 09/14/2016  Does Patient Have a Medical Advance Directive? No No No No No No No  Would patient like information on creating a medical advance directive? No - Patient declined No - Patient declined No - Patient declined - No - Patient declined No - Patient declined No - Patient declined  Pre-existing out of facility DNR order (yellow form or pink MOST form) - - - - - - -     Hospital Utilization Over the Past 12 Months: # of hospitalizations or ER visits: 0 # of surgeries: 0  Review of Systems    Patient reports that his overall health is unchanged compared to last year.  History obtained from chart review and the patient  Patient Reported Readings (BP, Pulse, CBG, Weight, etc) none  Pain Assessment Pain : No/denies pain     Current Medications & Allergies (verified) Allergies as of 09/03/2020      Reactions   Contrast Media [iodinated Diagnostic Agents]    Statins Other (See Comments)   Muscle pain   Other Itching, Swelling, Dermatitis   IV dye      Medication List       Accurate as of September 03, 2020  8:16 AM. If you have any questions, ask your nurse or doctor.        albuterol 108 (90 Base) MCG/ACT inhaler Commonly known as: ProAir HFA Inhale 1-2 puffs into the lungs every 6 (six) hours as needed for wheezing or shortness of breath. Place on file until patient asked for prescription   aspirin EC 81 MG tablet Take 1 tablet (81 mg total) by mouth daily.   Brilinta 90 MG Tabs tablet Generic drug: ticagrelor TAKE  (1)  TABLET TWICE A DAY.   ezetimibe 10 MG tablet Commonly known as: ZETIA Take 1 tablet (10 mg total) by mouth daily.   fluocinonide cream 0.05 % Commonly known as: LIDEX Apply 1 application topically 2 (two) times daily as needed.   isosorbide mononitrate 30 MG 24 hr tablet Commonly known as: IMDUR Take 1 tablet (30 mg total) by mouth daily.   metoprolol tartrate 25 MG tablet  Commonly known as: LOPRESSOR Take 0.5 tablets (12.5 mg total) by mouth 2 (two) times daily.   nitroGLYCERIN 0.4 MG SL tablet Commonly known as: NITROSTAT Place 1 tablet (0.4 mg total) under the tongue every 5 (five) minutes as needed for chest pain (up to 3 doses).   Repatha SureClick 962 MG/ML Soaj Generic drug: Evolocumab Inject 140 mg as directed every 14 (fourteen) days.   Spiriva HandiHaler 18 MCG inhalation capsule Generic  drug: tiotropium Place 1 capsule (18 mcg total) into inhaler and inhale daily. Place on file until patient asked for prescription       History (reviewed): Past Medical History:  Diagnosis Date   CAD (coronary artery disease) 12/2008   a. cath 11/2011 s/p DES to LAD b. anterior STEMI 09/04/2014 cath DES to occluded prox LAD, 40% AV groove LCx and 30% mid RCA   Cancer ([place]) 06/2012   prostate, larynx   CHF (congestive heart failure) (HCC)    COPD (chronic obstructive pulmonary disease) ([place])    CVA (cerebral infarction) 06/2009   Elevated PSA 02/2012   6.54   Hyperlipidemia    intolerant to statins   Myocardial infarction [facility])    PVD (peripheral vascular disease) ([place])    Aortobifemoral Bypass 2010, Thrombectomy and repair right brachial artery    Radiation 08/10/2012-09/22/2012   6525 cGy to larynx   Stroke ([place])    Tobacco abuse    Tubular adenoma    Unstable angina [facility])    Past Surgical History:  Procedure Laterality Date   Aortobifemoral bypass  2010   COLONOSCOPY W/ BIOPSIES     CORONARY ANGIOPLASTY WITH STENT PLACEMENT  11/2011   larynx biopsy  07/12/12   LEFT HEART CATHETERIZATION WITH CORONARY ANGIOGRAM N/A 12/17/2011   Procedure: LEFT HEART CATHETERIZATION WITH CORONARY ANGIOGRAM;  Surgeon: Hillary Bow, MD;  Location: [facility] CATH LAB;  Service: Cardiovascular;  Laterality: N/A;   LEFT HEART CATHETERIZATION WITH CORONARY ANGIOGRAM N/A 09/04/2014   Procedure: LEFT HEART CATHETERIZATION WITH CORONARY ANGIOGRAM;  Surgeon: Troy Sine, MD;  Location: [facility] CATH LAB;  Service: Cardiovascular;  Laterality: N/A;   LOWER EXTREMITY ANGIOGRAPHY Left 09/14/2016   Procedure: Lower Extremity Angiography;  Surgeon: Lorretta Harp, MD;  Location: [place] CV LAB;  Service: Cardiovascular;  Laterality: Left;   PERCUTANEOUS CORONARY STENT INTERVENTION (PCI-S) N/A 12/17/2011   Procedure: PERCUTANEOUS CORONARY STENT INTERVENTION (PCI-S);  Surgeon: Hillary Bow, MD;   Location: [facility] CATH LAB;  Service: Cardiovascular;  Laterality: N/A;   PROSTATE BIOPSY  07/12/12   Gleason's grade 3+3+6   PROSTATECTOMY  12/2015   Thrombectomy and brachial artery repair     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   Family History  Problem Relation Age of Onset   Coronary artery disease Father 6   Heart disease Father    Heart attack Father 67   Coronary artery disease Brother 38       died with AMI   Heart disease Brother    Breast cancer Mother    Cancer Sister        uterine   Stroke Maternal Grandmother    Cancer Paternal 70    Healthy Daughter    Healthy Son    Heart attack Brother    Healthy Son    Social History   Socioeconomic History   Marital status: Married    Spouse name: cheryl   Number of children: 3   Years of education: 12   Highest  education level: High school graduate  Occupational History   Occupation: truck driver    Comment: disabled  Tobacco Use   Smoking status: Current Every Day Smoker    Packs/day: 0.50    Years: 51.00    Pack years: 25.50    Types: Cigarettes    Start date: 10/07/1967   Smokeless tobacco: Never Used  Vaping Use   Vaping Use: Never used  Substance and Sexual Activity   Alcohol use: No    Alcohol/week: 0.0 standard drinks   Drug use: No   Sexual activity: Yes  Other Topics Concern   Not on file  Social History Narrative   Patient lives with his wife [name]).   Disabled.   Education high school.   Patient right handed   Social Determinants of Health   Financial Resource Strain: Not on file  Food Insecurity: Not on file  Transportation Needs: Not on file  Physical Activity: Not on file  Stress: Not on file  Social Connections: Not on file    Activities of Daily Living In your present state of health, do you have any difficulty performing the following activities: 09/03/2020  Hearing? N  Vision? N  Difficulty concentrating or making  decisions? N  Walking or climbing stairs? N  Dressing or bathing? N  Doing errands, shopping? N  Preparing Food and eating ? N  Using the Toilet? N  In the past six months, have you accidently leaked urine? N  Do you have problems with loss of bowel control? N  Managing your Medications? N  Managing your Finances? N  Housekeeping or managing your Housekeeping? N  Some recent data might be hidden    Patient Education/ Literacy How often do you need to have someone help you when you read instructions, pamphlets, or other written materials from your doctor or pharmacy?: 1 - Never What is the last grade level you completed in school?: 12th grade  Exercise Current Exercise Habits: The patient does not participate in regular exercise at present, Exercise limited by: None identified  Diet Patient reports consuming 2 meals a day and 2 snack(s) a day Patient reports that his primary diet is: Regular Patient reports that he does have regular access to food.   Depression Screen PHQ 2/9 Scores 09/03/2020 08/16/2020 01/16/2019 01/11/2019 09/14/2018 07/27/2018 07/13/2018  PHQ - 2 Score 0 0 0 0 0 0 0     Fall Risk Fall Risk  09/03/2020 08/16/2020 02/14/2020 01/16/2019 01/11/2019  Falls in the past year? 0 0 1 0 0  Number falls in past yr: - 0 0 0 -  Injury with Fall? - 0 0 0 -  Risk for fall due to : - - Impaired balance/gait Impaired balance/gait -  Risk for fall due to: Comment - - - left sided weakness, latent effects of CVA -  Follow up Falls evaluation completed Falls evaluation completed Falls evaluation completed Falls prevention discussed -     Objective:  [name] seemed alert and oriented and he participated appropriately during our telephone visit.  Blood Pressure Weight BMI  BP Readings from Last 3 Encounters:  08/16/20 103/61  03/06/20 111/67  02/14/20 124/85   Wt Readings from Last 3 Encounters:  08/16/20 197 lb 9.6 oz (89.6 kg)  03/06/20 201 lb 6.4 oz (91.4 kg)  02/14/20  199 lb (90.3 kg)   BMI Readings from Last 1 Encounters:  08/16/20 29.61 kg/m    *Unable to obtain current vital signs, weight, and BMI due  to telephone visit type  Hearing/Vision   Nicholai did not seem to have difficulty with hearing/understanding during the telephone conversation  Reports that he has had a formal eye exam by an eye care professional within the past year  Reports that he has not had a formal hearing evaluation within the past year *Unable to fully assess hearing and vision during telephone visit type  Cognitive Function: 6CIT Screen 09/03/2020  What Year? 0 points  What month? 0 points  What time? 0 points  Count back from 20 0 points  Months in reverse 0 points  Repeat phrase 0 points  Total Score 0   (Normal:0-7, Significant for Dysfunction: >8)  Normal Cognitive Function Screening: Yes   Immunization & Health Maintenance Record Immunization History  Administered Date(s) Administered   Pneumococcal Conjugate-13 08/16/2020   Tdap 11/03/2016    Health Maintenance  Topic Date Due   COLONOSCOPY (Pts 45-13yrs Insurance coverage will need to be confirmed)  08/14/2017   INFLUENZA VACCINE  09/26/2020 (Originally 01/28/2020)   TETANUS/TDAP  11/04/2026   Hepatitis C Screening  Completed   HPV VACCINES  Aged Out   COVID-19 Vaccine  Discontinued   PNA vac Low Risk Adult  Discontinued       Assessment  This is a routine wellness examination for [name].  Health Maintenance: Due or Overdue Health Maintenance Due  Topic Date Due   COLONOSCOPY (Pts 45-81yrs Insurance coverage will need to be confirmed)  08/14/2017    [name] does not need a referral for Community Assistance: Care Management:   no Social Work:    no Prescription Assistance:  no Nutrition/Diabetes Education:  no   Plan:  Personalized Goals Goals Addressed            This Visit's Progress    Patient Stated       09/03/2020 AWV Goal: Exercise for General  Health   Patient will verbalize understanding of the benefits of increased physical activity:  Exercising regularly is important. It will improve your overall fitness, flexibility, and endurance.  Regular exercise also will improve your overall health. It can help you control your weight, reduce stress, and improve your bone density.  Over the next year, patient will increase physical activity as tolerated with a goal of at least 150 minutes of moderate physical activity per week.   You can tell that you are exercising at a moderate intensity if your heart starts beating faster and you start breathing faster but can still hold a conversation.  Moderate-intensity exercise ideas include:  Walking 1 mile (1.6 km) in about 15 minutes  Biking  Hiking  Golfing  Dancing  Water aerobics  Patient will verbalize understanding of everyday activities that increase physical activity by providing examples like the following: ? Yard work, such as: ? Pushing a Conservation officer, nature ? Raking and bagging leaves ? Washing your car ? Pushing a stroller ? Shoveling snow ? Gardening ? Washing windows or floors  Patient will be able to explain general safety guidelines for exercising:   Before you start a new exercise program, talk with your health care provider.  Do not exercise so much that you hurt yourself, feel dizzy, or get very short of breath.  Wear comfortable clothes and wear shoes with good support.  Drink plenty of water while you exercise to prevent dehydration or heat stroke.  Work out until your breathing and your heartbeat get faster.       Personalized Health Maintenance & Screening  Recommendations  Colorectal cancer screening  Lung Cancer Screening Recommended: yes (Low Dose CT Chest recommended if Age 66-80 years, 30 pack-year currently smoking OR have quit w/in past 15 years) Hepatitis C Screening recommended: no HIV Screening recommended: no  Advanced Directives: Written  information was not prepared per patient's request.  Referrals & Orders No orders of the defined types were placed in this encounter.   Follow-up Plan  Follow-up with Dettinger, Fransisca Kaufmann, MD as planned  Schedule colonoscopy    I have personally reviewed and noted the following in the patients chart:    Medical and social history  Use of alcohol, tobacco or illicit drugs   Current medications and supplements  Functional ability and status  Nutritional status  Physical activity  Advanced directives  List of other physicians  Hospitalizations, surgeries, and ER visits in previous 12 months  Vitals  Screenings to include cognitive, depression, and falls  Referrals and appointments  In addition, I have reviewed and discussed with [name] certain preventive protocols, quality metrics, and best practice recommendations. A written personalized care plan for preventive services as well as general preventive health recommendations is available and can be mailed to the patient at his request.      Felicity Coyer, LPN    11/29/4467  Patient declined after visit summary

## 2020-09-24 NOTE — Progress Notes (Signed)
Cardiology Office Note   Date:  09/25/2020   ID:  [name], DOB 04-23-1951, MRN [id number]  PCP:  Dettinger, Fransisca Kaufmann, MD  Cardiologist:   Minus Breeding, MD   Chief Complaint  Patient presents with  . Dizziness      History of Present Illness: [name] is a 70 y.o. male who presents for follow up of CAD. He was hospitalized in March of 2016 with an acute anterior myocardial infarction. Catheterization demonstrated total occlusion of the LAD proximal to his previously placed stent. He had 30% RCA stenosis and 40% stenosis elsewhere. He was on Plavix at that time and seems to have failed that medication. He did have an ejection fraction of about 45% with anteroapical akinesis. He has also seen Dr. Gwenlyn Found and Dr. Scot Dock for claudication. He has had angiography and had aortobifemoral bypass graft with diffuse 70 - 80% proximal, mid and distal left SFA stenosis. He had 2 vessel runoff with an occluded peroneal. He was not thought to be a candidate for PTA or redo surgery.   I had a televisit with him in he has had occasional dizziness.  The patient denies any new symptoms such as chest discomfort, neck or arm discomfort. There has been no new shortness of breath, PND or orthopnea. There have been no reported palpitations, presyncope or syncope.    Past Medical History:  Diagnosis Date  . CAD (coronary artery disease) 12/2008   a. cath 11/2011 s/p DES to LAD b. anterior STEMI 09/04/2014 cath DES to occluded prox LAD, 40% AV groove LCx and 30% mid RCA  . Cancer [facility]) 06/2012   prostate, larynx  . CHF (congestive heart failure) ([place])   . COPD (chronic obstructive pulmonary disease) ([place])   . CVA (cerebral infarction) 06/2009  . Elevated PSA 02/2012   6.54  . Hyperlipidemia    intolerant to statins  . Myocardial infarction ([place])   . PVD (peripheral vascular disease) ([place])    Aortobifemoral Bypass 2010, Thrombectomy and repair right brachial artery   . Radiation  08/10/2012-09/22/2012   6525 cGy to larynx  . Stroke ([place])   . Tobacco abuse   . Tubular adenoma   . Unstable angina [facility])     Past Surgical History:  Procedure Laterality Date  . Aortobifemoral bypass  2010  . COLONOSCOPY W/ BIOPSIES    . CORONARY ANGIOPLASTY WITH STENT PLACEMENT  11/2011  . larynx biopsy  07/12/12  . LEFT HEART CATHETERIZATION WITH CORONARY ANGIOGRAM N/A 12/17/2011   Procedure: LEFT HEART CATHETERIZATION WITH CORONARY ANGIOGRAM;  Surgeon: Hillary Bow, MD;  Location: [facility] CATH LAB;  Service: Cardiovascular;  Laterality: N/A;  . LEFT HEART CATHETERIZATION WITH CORONARY ANGIOGRAM N/A 09/04/2014   Procedure: LEFT HEART CATHETERIZATION WITH CORONARY ANGIOGRAM;  Surgeon: Troy Sine, MD;  Location: [facility] C[facility] LAB;  Service: Cardiovascular;  Laterality: N/A;  . LOWER EXTREMITY ANGIOGRAPHY Left 09/14/2016   Procedure: Lower Extremity Angiography;  Surgeon: Lorretta Harp, MD;  Location: [place] CV LAB;  Service: Cardiovascular;  Laterality: Left;  . PERCUTANEOUS CORONARY STENT INTERVENTION (PCI-S) N/A 12/17/2011   Procedure: PERCUTANEOUS CORONARY STENT INTERVENTION (PCI-S);  Surgeon: Hillary Bow, MD;  Location: [facility] CATH LAB;  Service: Cardiovascular;  Laterality: N/A;  . PROSTATE BIOPSY  07/12/12   Gleason's grade 3+3+6  . PROSTATECTOMY  12/2015  . 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 albuterol (PROAIR HFA) 108 (90 Base) MCG/ACT inhaler Inhale 1-2 puffs into the lungs every 6 (six) hours as needed for wheezing or shortness of breath. Place on file until patient asked for prescription 18 g 11  . aspirin EC 81 MG tablet Take 1 tablet (81 mg total) by mouth daily.    Marland Kitchen BRILINTA 90 MG TABS tablet TAKE  (1)  TABLET TWICE A DAY. 60 tablet 1  . ezetimibe (ZETIA) 10 MG tablet Take 1 tablet (10 mg total) by mouth daily. 30 tablet 5  .  fluocinonide cream (LIDEX) 0.09 % Apply 1 application topically 2 (two) times daily as needed.    . isosorbide mononitrate (IMDUR) 30 MG 24 hr tablet Take 1 tablet (30 mg total) by mouth daily. 30 tablet 11  . metoprolol tartrate (LOPRESSOR) 25 MG tablet Take 0.5 tablets (12.5 mg total) by mouth 2 (two) times daily. 30 tablet 5  . nitroGLYCERIN (NITROSTAT) 0.4 MG SL tablet Place 1 tablet (0.4 mg total) under the tongue every 5 (five) minutes as needed for chest pain (up to 3 doses). 25 tablet 5  . REPATHA SURECLICK 381 MG/ML SOAJ Inject 140 mg as directed every 14 (fourteen) days. 2 mL 11  . tiotropium (SPIRIVA HANDIHALER) 18 MCG inhalation capsule Place 1 capsule (18 mcg total) into inhaler and inhale daily. Place on file until patient asked for prescription 90 capsule 3   No current facility-administered medications for this visit.    Allergies:   Contrast media [iodinated diagnostic agents], Statins, and Other    ROS:  Please see the history of present illness.   Otherwise, review of systems are positive for none.   All other systems are reviewed and negative.    PHYSICAL EXAM: VS:  BP 102/70   Pulse (!) 50   Ht 5\' 9"  (1.753 m)   Wt 194 lb (88 kg)   BMI 28.65 kg/m  , BMI Body mass index is 28.65 kg/m. GENERAL:  Well appearing NECK:  No jugular venous distention, waveform within normal limits, carotid upstroke brisk and symmetric, no bruits, no thyromegaly LUNGS:  Clear to auscultation bilaterally CHEST:  Unremarkable HEART:  PMI not displaced or sustained,S1 and S2 within normal limits, no S3, no S4, no clicks, no rubs, no murmurs ABD:  Flat, positive bowel sounds normal in frequency in pitch, no bruits, no rebound, no guarding, no midline pulsatile mass, no hepatomegaly, no splenomegaly EXT:  2 plus pulses upper, DPPT left reduced, DP right reduced, no edema, no cyanosis no clubbing     EKG:  EKG is ordered today. The ekg ordered today demonstrates sinus bradycardia, rate 50,  leftward axis, RSR prime V1 V2, nonspecific T wave flattening.   Recent Labs: 08/16/2020: ALT 18; BUN 8; Creatinine, Ser 0.93; Hemoglobin 14.8; Platelets 268; Potassium 4.1; Sodium 141    Lipid Panel    Component Value Date/Time   CHOL 162 08/16/2020 1002   CHOL 196 12/15/2012 1007   TRIG 228 (H) 08/16/2020 1002   TRIG 176 (H) 12/15/2012 1007   HDL 42 08/16/2020 1002   HDL 42 12/15/2012 1007   CHOLHDL 3.9 08/16/2020 1002   CHOLHDL 5.9 12/18/2011 0505   VLDL 53 (H) 12/18/2011 0505   LDLCALC 82 08/16/2020 1002   LDLCALC 119 (H) 12/15/2012 1007      Wt Readings from Last 3 Encounters:  09/25/20 194 lb (88 kg)  08/16/20 197 lb 9.6 oz (89.6 kg)  03/06/20 201 lb 6.4  oz (91.4 kg)      Other studies Reviewed: Additional studies/ records that were reviewed today include: Labs. Review of the above records demonstrates:  Please see elsewhere in the note.     ASSESSMENT AND PLAN:  CAD - He has no new symptoms.  No change in therapy.  He will continue with risk reduction.  Hyperlipidemia -  He is statin intolerant of tolerating PCSK9.  No change in therapy.  LDL was 82.  Tobacco abuse -  He tried Chantix and it did not work.  He cannot quit smoking unfortunately.  Ischemic cardiomyopathy - EF was low normal(50 - 55%)with regional wall motion abnormalities in 2017.   I would like to try to keep him on the low-dose of beta-blocker that he is taking.  However, he is having occasional dizziness.  He says if this gets worse he will let me know and I would discontinue the beta-blocker.  Current medicines are reviewed at length with the patient today.  The patient does not have concerns regarding medicines.  The following changes have been made:  no change  Labs/ tests ordered today include:   Orders Placed This Encounter  Procedures  . EKG 12-Lead     Disposition:   FU with me in one year.     Signed, Minus Breeding, MD  09/25/2020 5:13 PM    [facility]

## 2020-09-25 ENCOUNTER — Encounter: Payer: Self-pay | Admitting: Cardiology

## 2020-09-25 ENCOUNTER — Other Ambulatory Visit: Payer: Self-pay

## 2020-09-25 ENCOUNTER — Ambulatory Visit: Payer: Medicare HMO | Admitting: Cardiology

## 2020-09-25 VITALS — BP 102/70 | HR 50 | Ht 69.0 in | Wt 194.0 lb

## 2020-09-25 DIAGNOSIS — I25119 Atherosclerotic heart disease of native coronary artery with unspecified angina pectoris: Secondary | ICD-10-CM | POA: Diagnosis not present

## 2020-09-25 DIAGNOSIS — I255 Ischemic cardiomyopathy: Secondary | ICD-10-CM | POA: Diagnosis not present

## 2020-09-25 DIAGNOSIS — Z720 Tobacco use: Secondary | ICD-10-CM

## 2020-09-25 NOTE — Patient Instructions (Signed)
Medication Instructions:  The current medical regimen is effective;  continue present plan and medications.  *If you need a refill on your cardiac medications before your next appointment, please call your pharmacy*  Follow-Up: At [facility], you and your health needs are our priority.  As part of our continuing mission to provide you with exceptional heart care, we have created designated Provider Care Teams.  These Care Teams include your primary Cardiologist (physician) and Advanced Practice Providers (APPs -  Physician Assistants and Nurse Practitioners) who all work together to provide you with the care you need, when you need it.  We recommend signing up for the patient portal called "MyChart".  Sign up information is provided on this After Visit Summary.  MyChart is used to connect with patients for Virtual Visits (Telemedicine).  Patients are able to view lab/test results, encounter notes, upcoming appointments, etc.  Non-urgent messages can be sent to your provider as well.   To learn more about what you can do with MyChart, go to [email or web address].    Your next appointment:   12 month(s)  The format for your next appointment:   In Person  Provider:   James Hochrein, MD   Thank you for choosing [facility]!!     

## 2020-11-20 ENCOUNTER — Other Ambulatory Visit: Payer: Self-pay | Admitting: Cardiology

## 2020-12-05 ENCOUNTER — Other Ambulatory Visit: Payer: Self-pay

## 2020-12-05 DIAGNOSIS — I739 Peripheral vascular disease, unspecified: Secondary | ICD-10-CM

## 2020-12-25 ENCOUNTER — Ambulatory Visit (HOSPITAL_COMMUNITY)
Admission: RE | Admit: 2020-12-25 | Discharge: 2020-12-25 | Disposition: A | Discharge status: Home / Self Care | Payer: Medicare HMO | Source: Ambulatory Visit | Attending: Vascular Surgery | Admitting: Vascular Surgery

## 2020-12-25 ENCOUNTER — Ambulatory Visit: Payer: Medicare HMO | Admitting: Vascular Surgery

## 2020-12-25 ENCOUNTER — Other Ambulatory Visit: Payer: Self-pay

## 2020-12-25 ENCOUNTER — Encounter: Payer: Self-pay | Admitting: Vascular Surgery

## 2020-12-25 VITALS — BP 97/61 | HR 56 | Temp 97.7°F | Resp 20 | Ht 69.0 in | Wt 196.0 lb

## 2020-12-25 DIAGNOSIS — I739 Peripheral vascular disease, unspecified: Secondary | ICD-10-CM | POA: Diagnosis not present

## 2020-12-25 DIAGNOSIS — I509 Heart failure, unspecified: Secondary | ICD-10-CM | POA: Diagnosis not present

## 2020-12-25 NOTE — Progress Notes (Signed)
REASON FOR VISIT:   Follow-up of peripheral vascular disease  MEDICAL ISSUES:   PERIPHERAL VASCULAR DISEASE: This patient has a patent aortobifemoral bypass graft.  This was done in October 2010.  He has a known superficial femoral artery occlusion.  He has stable claudication.  I would only consider arteriography and consider for femoropopliteal bypass grafting if he developed disabling claudication, rest pain, or nonhealing ulcer.  We have again discussed the importance of tobacco cessation.  He is on aspirin.  He is also on Repatha.  I have ordered follow-up ABIs in 1 year and I will see him back at that time.  He knows to call sooner if he has problems.  HPI:   [name] is a pleasant 70 y.o. male who I last saw on 09/16/2016.  I performed an aortobifemoral bypass graft on him in 2010.  This was a 14 x 7 mm Dacron graft.  The proximal anastomosis was in the side.  He has known left superficial femoral artery occlusive disease.  Of note, this patient underwent a prostatectomy in [place] and it sounds like perioperatively he occluded his wound the left limb of his aortofemoral bypass graft and required thrombectomy and fasciotomy.   Since I saw him last, he continues to have calf claudication on the left after walking 5 to 10 minutes.  He denies any thigh or hip claudication.  He denies any history of rest pain or nonhealing ulcers.  He had no symptoms on the right side.  He does continue to smoke about a half a pack per day and has been smoking for 51 years.  Past Medical History:  Diagnosis Date   CAD (coronary artery disease) 12/2008   a. cath 11/2011 s/p DES to LAD b. anterior STEMI 09/04/2014 cath DES to occluded prox LAD, 40% AV groove LCx and 30% mid RCA   Cancer ([place]) 06/2012   prostate, larynx   CHF (congestive heart failure) (HCC)    COPD (chronic obstructive pulmonary disease) (HCC)    CVA (cerebral infarction) 06/2009   Elevated PSA 02/2012   6.54   Hyperlipidemia     intolerant to statins   Myocardial infarction [facility])    PVD (peripheral vascular disease) ([place])    Aortobifemoral Bypass 2010, Thrombectomy and repair right brachial artery    Radiation 08/10/2012-09/22/2012   6525 cGy to larynx   Stroke ([place])    Tobacco abuse    Tubular adenoma    Unstable angina (HCC)     Family History  Problem Relation Age of Onset   Coronary artery disease Father 9   Heart disease Father    Heart attack Father 25   Coronary artery disease Brother 56       died with AMI   Heart disease Brother    Breast cancer Mother    Cancer Sister        uterine   Stroke Maternal Grandmother    Cancer Paternal 90    Healthy Daughter    Healthy Son    Heart attack Brother    Healthy Son     SOCIAL HISTORY: Social History   Tobacco Use   Smoking status: Every Day    Packs/day: 0.50    Years: 51.00    Pack years: 25.50    Types: Cigarettes    Start date: 10/07/1967   Smokeless tobacco: Never  Substance Use Topics   Alcohol use: No    Alcohol/week: 0.0 standard drinks    Allergies  Allergen Reactions   Contrast Media [Iodinated Diagnostic Agents]    Statins Other (See Comments)    Muscle pain   Other Itching, Swelling and Dermatitis    IV dye    Current Outpatient Medications  Medication Sig Dispense Refill   albuterol (PROAIR HFA) 108 (90 Base) MCG/ACT inhaler Inhale 1-2 puffs into the lungs every 6 (six) hours as needed for wheezing or shortness of breath. Place on file until patient asked for prescription 18 g 11   aspirin EC 81 MG tablet Take 1 tablet (81 mg total) by mouth daily.     BRILINTA 90 MG TABS tablet TAKE (1) TABLET TWICE A DAY. 60 tablet 1   ezetimibe (ZETIA) 10 MG tablet Take 1 tablet (10 mg total) by mouth daily. 30 tablet 5   fluocinonide cream (LIDEX) 7.78 % Apply 1 application topically 2 (two) times daily as needed.     isosorbide mononitrate (IMDUR) 30 MG 24 hr tablet Take 1 tablet (30 mg total) by mouth daily. 30 tablet  11   metoprolol tartrate (LOPRESSOR) 25 MG tablet Take 0.5 tablets (12.5 mg total) by mouth 2 (two) times daily. 30 tablet 5   nitroGLYCERIN (NITROSTAT) 0.4 MG SL tablet Place 1 tablet (0.4 mg total) under the tongue every 5 (five) minutes as needed for chest pain (up to 3 doses). 25 tablet 5   REPATHA SURECLICK 242 MG/ML SOAJ Inject 140 mg as directed every 14 (fourteen) days. 2 mL 11   tiotropium (SPIRIVA HANDIHALER) 18 MCG inhalation capsule Place 1 capsule (18 mcg total) into inhaler and inhale daily. Place on file until patient asked for prescription 90 capsule 3   No current facility-administered medications for this visit.    REVIEW OF SYSTEMS:  [X]  denotes positive finding, [ ]  denotes negative finding Cardiac  Comments:  Chest pain or chest pressure:    Shortness of breath upon exertion:    Short of breath when lying flat:    Irregular heart rhythm:        Vascular    Pain in calf, thigh, or hip brought on by ambulation: x   Pain in feet at night that wakes you up from your sleep:     Blood clot in your veins:    Leg swelling:         Pulmonary    Oxygen at home:    Productive cough:     Wheezing:         Neurologic    Sudden weakness in arms or legs:     Sudden numbness in arms or legs:     Sudden onset of difficulty speaking or slurred speech:    Temporary loss of vision in one eye:     Problems with dizziness:         Gastrointestinal    Blood in stool:     Vomited blood:         Genitourinary    Burning when urinating:     Blood in urine:        Psychiatric    Major depression:         Hematologic    Bleeding problems:    Problems with blood clotting too easily:        Skin    Rashes or ulcers:        Constitutional    Fever or chills:     PHYSICAL EXAM:   Vitals:   12/25/20 1544  BP: 97/61  Pulse: (!) 56  Resp: 20  Temp: 97.7 F (36.5 C)  SpO2: 95%  Weight: 196 lb (88.9 kg)  Height: 5\' 9"  (1.753 m)    GENERAL: The patient is a  well-nourished male, in no acute distress. The vital signs are documented above. CARDIAC: There is a regular rate and rhythm.  VASCULAR: I do not detect carotid bruits. On the right side he has a palpable femoral, popliteal, and dorsalis pedis pulse. On the left side he has a palpable femoral pulse.  I cannot palpate pedal pulses. PULMONARY: There is good air exchange bilaterally without wheezing or rales. ABDOMEN: Soft and non-tender with normal pitched bowel sounds.  MUSCULOSKELETAL: There are no major deformities or cyanosis. NEUROLOGIC: No focal weakness or paresthesias are detected. SKIN: There are no ulcers or rashes noted. PSYCHIATRIC: The patient has a normal affect.  DATA:    ARTERIAL DOPPLER STUDY: I have independently interpreted his arterial Doppler study today.  On the right side there is a triphasic dorsalis pedis and posterior tibial signal.  ABI is 100%.  Toe pressures 105 mmHg.  On the left side there is a monophasic dorsalis pedis and posterior tibial signal.  ABI is 66%.  Toe pressures 52 mmHg.  Deitra Mayo [facility] [phone]

## 2020-12-27 ENCOUNTER — Other Ambulatory Visit: Payer: Self-pay

## 2020-12-27 DIAGNOSIS — I739 Peripheral vascular disease, unspecified: Secondary | ICD-10-CM

## 2021-01-22 ENCOUNTER — Other Ambulatory Visit: Payer: Self-pay | Admitting: Cardiology

## 2021-02-17 ENCOUNTER — Ambulatory Visit (INDEPENDENT_AMBULATORY_CARE_PROVIDER_SITE_OTHER): Payer: Medicare HMO | Admitting: Family Medicine

## 2021-02-17 ENCOUNTER — Other Ambulatory Visit: Payer: Self-pay

## 2021-02-17 ENCOUNTER — Encounter: Payer: Self-pay | Admitting: Family Medicine

## 2021-02-17 VITALS — BP 137/65 | HR 77 | Ht 69.0 in | Wt 194.0 lb

## 2021-02-17 DIAGNOSIS — E782 Mixed hyperlipidemia: Secondary | ICD-10-CM

## 2021-02-17 DIAGNOSIS — I739 Peripheral vascular disease, unspecified: Secondary | ICD-10-CM | POA: Diagnosis not present

## 2021-02-17 DIAGNOSIS — J439 Emphysema, unspecified: Secondary | ICD-10-CM | POA: Diagnosis not present

## 2021-02-17 DIAGNOSIS — I25119 Atherosclerotic heart disease of native coronary artery with unspecified angina pectoris: Secondary | ICD-10-CM

## 2021-02-17 MED ORDER — FLUOCINONIDE 0.05 % EX CREA: 1.0000 "application " | TOPICAL_CREAM | Freq: Two times a day (BID) | CUTANEOUS | 5 refills | Status: DC | PRN

## 2021-02-17 MED ORDER — ISOSORBIDE MONONITRATE ER 30 MG PO TB24: 30.0000 mg | ORAL_TABLET | Freq: Every day | ORAL | 3 refills | Status: DC

## 2021-02-17 MED ORDER — EZETIMIBE 10 MG PO TABS: 10.0000 mg | ORAL_TABLET | Freq: Every day | ORAL | 3 refills | Status: DC

## 2021-02-17 MED ORDER — METOPROLOL TARTRATE 25 MG PO TABS: 12.5000 mg | ORAL_TABLET | Freq: Two times a day (BID) | ORAL | 3 refills | Status: DC

## 2021-02-17 NOTE — Progress Notes (Signed)
BP 137/65   Pulse 77   Ht _0  (1.753 m)   Wt 194 lb (88 kg)   SpO2 98%   BMI 28.65 kg/m    Subjective:   Patient ID: [name], male    DOB: [birth year], 70 y.o.   MRN: [id number]  HPI: [name] is a 70 y.o. male presenting on 02/17/2021 for Medical Management of Chronic Issues, Hyperlipidemia, and Hypertension   HPI Hyperlipidemia and PAD and CAD Patient is coming in for recheck of his hyperlipidemia. The patient is currently taking Zetia and Brilinta and Imdur and metoprolol and Repatha. They deny any issues with myalgias or history of liver damage from it. They deny any focal numbness or weakness or chest pain.  He does get some leg pains from activity and his legs feel heavy.  He has had previous stenting and bypass in his legs  COPD Patient is coming in for COPD recheck today.  He is currently on Spiriva.  He has a mild chronic cough but denies any major coughing spells or wheezing spells.  He has 0nighttime symptoms per week and 2daytime symptoms per week currently.  He mainly has exertional symptoms.  Patient is still smoking  Relevant past medical, surgical, family and social history reviewed and updated as indicated. Interim medical history since our last visit reviewed. Allergies and medications reviewed and updated.  Review of Systems  Constitutional:  Negative for chills and fever.  Eyes:  Negative for visual disturbance.  Respiratory:  Positive for cough and wheezing. Negative for shortness of breath.   Cardiovascular:  Negative for chest pain and leg swelling.  Musculoskeletal:  Negative for back pain and gait problem.  Skin:  Negative for rash.  Neurological:  Negative for dizziness, weakness and light-headedness.  All other systems reviewed and are negative.  Per HPI unless specifically indicated above   Allergies as of 02/17/2021       Reactions   Contrast Media [iodinated Diagnostic Agents]    Statins Other (See Comments)   Muscle pain    Other Itching, Swelling, Dermatitis   IV dye        Medication List        Accurate as of February 17, 2021  8:27 AM. If you have any questions, ask your nurse or doctor.          albuterol 108 (90 Base) MCG/ACT inhaler Commonly known as: ProAir HFA Inhale 1-2 puffs into the lungs every 6 (six) hours as needed for wheezing or shortness of breath. Place on file until patient asked for prescription   aspirin EC 81 MG tablet Take 1 tablet (81 mg total) by mouth daily.   Brilinta 90 MG Tabs tablet Generic drug: ticagrelor TAKE (1) TABLET TWICE A DAY.   ezetimibe 10 MG tablet Commonly known as: ZETIA Take 1 tablet (10 mg total) by mouth daily.   fluocinonide cream 0.05 % Commonly known as: LIDEX Apply 1 application topically 2 (two) times daily as needed.   isosorbide mononitrate 30 MG 24 hr tablet Commonly known as: IMDUR Take 1 tablet (30 mg total) by mouth daily.   metoprolol tartrate 25 MG tablet Commonly known as: LOPRESSOR Take 0.5 tablets (12.5 mg total) by mouth 2 (two) times daily.   nitroGLYCERIN 0.4 MG SL tablet Commonly known as: NITROSTAT Place 1 tablet (0.4 mg total) under the tongue every 5 (five) minutes as needed for chest pain (up to 3 doses).   Repatha SureClick 191 MG/ML Soaj Generic drug:  Evolocumab Inject 140 mg as directed every 14 (fourteen) days.   Spiriva HandiHaler 18 MCG inhalation capsule Generic drug: tiotropium Place 1 capsule (18 mcg total) into inhaler and inhale daily. Place on file until patient asked for prescription         Objective:   BP 137/65   Pulse 77   Ht _0  (1.753 m)   Wt 194 lb (88 kg)   SpO2 98%   BMI 28.65 kg/m   Wt Readings from Last 3 Encounters:  02/17/21 194 lb (88 kg)  12/25/20 196 lb (88.9 kg)  09/25/20 194 lb (88 kg)    Physical Exam Vitals and nursing note reviewed.  Constitutional:      General: He is not in acute distress.    Appearance: He is well-developed. He is not diaphoretic.   Eyes:     General: No scleral icterus.    Conjunctiva/sclera: Conjunctivae normal.  Neck:     Thyroid: No thyromegaly.  Cardiovascular:     Rate and Rhythm: Normal rate and regular rhythm.     Heart sounds: Normal heart sounds. No murmur heard. Pulmonary:     Effort: Pulmonary effort is normal. No respiratory distress.     Breath sounds: Normal breath sounds. No wheezing.  Musculoskeletal:        General: Normal range of motion.     Cervical back: Neck supple.  Lymphadenopathy:     Cervical: No cervical adenopathy.  Skin:    General: Skin is warm and dry.     Findings: No rash.  Neurological:     Mental Status: He is alert and oriented to person, place, and time.     Coordination: Coordination normal.  Psychiatric:        Behavior: Behavior normal.      Assessment & Plan:   Problem List Items Addressed This Visit       Cardiovascular and Mediastinum   PAD (peripheral artery disease) (HCC)   Relevant Medications   metoprolol tartrate (LOPRESSOR) 25 MG tablet   isosorbide mononitrate (IMDUR) 30 MG 24 hr tablet   ezetimibe (ZETIA) 10 MG tablet   Other Relevant Orders   CBC with Differential/Platelet   CMP14+EGFR   Coronary artery disease involving native coronary artery of native heart with angina pectoris (HCC)   Relevant Medications   metoprolol tartrate (LOPRESSOR) 25 MG tablet   isosorbide mononitrate (IMDUR) 30 MG 24 hr tablet   ezetimibe (ZETIA) 10 MG tablet   Other Relevant Orders   CBC with Differential/Platelet     Respiratory   COPD (chronic obstructive pulmonary disease) (HCC)     Other   Hyperlipidemia - Primary   Relevant Medications   metoprolol tartrate (LOPRESSOR) 25 MG tablet   isosorbide mononitrate (IMDUR) 30 MG 24 hr tablet   ezetimibe (ZETIA) 10 MG tablet   Other Relevant Orders   Lipid panel    Patient have some difficulties and stressors with his wife.  Think she may be developing schizophrenia.  It does run in her family. Follow up  plan: Return in about 6 months (around 08/20/2021), or if symptoms worsen or fail to improve, for Physical exam and recheck cholesterol and COPD.  Counseling provided for all of the vaccine components Orders Placed This Encounter  Procedures   CBC with Differential/Platelet   CMP14+EGFR   Lipid panel     Caryl Pina, MD [facility] 02/17/2021, 8:27 AM

## 2021-02-18 DIAGNOSIS — C320 Malignant neoplasm of glottis: Secondary | ICD-10-CM | POA: Diagnosis not present

## 2021-02-18 LAB — CBC WITH DIFFERENTIAL/PLATELET
Basophils Absolute: 0.1 10*3/uL (ref 0.0–0.2)
Basos: 1 %
EOS (ABSOLUTE): 0.2 10*3/uL (ref 0.0–0.4)
Eos: 3 %
Hematocrit: 49.8 % (ref 37.5–51.0)
Hemoglobin: 16.7 g/dL (ref 13.0–17.7)
Immature Grans (Abs): 0 10*3/uL (ref 0.0–0.1)
Immature Granulocytes: 0 %
Lymphocytes Absolute: 2.2 10*3/uL (ref 0.7–3.1)
Lymphs: 29 %
MCH: 30.7 pg (ref 26.6–33.0)
MCHC: 33.5 g/dL (ref 31.5–35.7)
MCV: 92 fL (ref 79–97)
Monocytes Absolute: 0.6 10*3/uL (ref 0.1–0.9)
Monocytes: 8 %
Neutrophils Absolute: 4.5 10*3/uL (ref 1.4–7.0)
Neutrophils: 59 %
Platelets: 233 10*3/uL (ref 150–450)
RBC: 5.44 x10E6/uL (ref 4.14–5.80)
RDW: 13.3 % (ref 11.6–15.4)
WBC: 7.6 10*3/uL (ref 3.4–10.8)

## 2021-02-18 LAB — CMP14+EGFR
ALT: 18 IU/L (ref 0–44)
AST: 20 IU/L (ref 0–40)
Albumin/Globulin Ratio: 2 (ref 1.2–2.2)
Albumin: 4.5 g/dL (ref 3.8–4.8)
Alkaline Phosphatase: 102 IU/L (ref 44–121)
BUN/Creatinine Ratio: 12 (ref 10–24)
BUN: 12 mg/dL (ref 8–27)
Bilirubin Total: 0.4 mg/dL (ref 0.0–1.2)
CO2: 18 mmol/L — ABNORMAL LOW (ref 20–29)
Calcium: 9.5 mg/dL (ref 8.6–10.2)
Chloride: 104 mmol/L (ref 96–106)
Creatinine, Ser: 1.04 mg/dL (ref 0.76–1.27)
Globulin, Total: 2.2 g/dL (ref 1.5–4.5)
Glucose: 101 mg/dL — ABNORMAL HIGH (ref 65–99)
Potassium: 4.2 mmol/L (ref 3.5–5.2)
Sodium: 140 mmol/L (ref 134–144)
Total Protein: 6.7 g/dL (ref 6.0–8.5)
eGFR: 78 mL/min/{1.73_m2} (ref >59)

## 2021-02-18 LAB — LIPID PANEL
Chol/HDL Ratio: 3.1 ratio (ref 0.0–5.0)
Cholesterol, Total: 126 mg/dL (ref 100–199)
HDL: 41 mg/dL (ref >39)
LDL Chol Calc (NIH): 48 mg/dL (ref 0–99)
Triglycerides: 238 mg/dL — ABNORMAL HIGH (ref 0–149)
VLDL Cholesterol Cal: 37 mg/dL (ref 5–40)

## 2021-03-05 ENCOUNTER — Other Ambulatory Visit: Payer: Self-pay | Admitting: Cardiology

## 2021-04-01 DIAGNOSIS — K219 Gastro-esophageal reflux disease without esophagitis: Secondary | ICD-10-CM | POA: Diagnosis not present

## 2021-04-01 DIAGNOSIS — C320 Malignant neoplasm of glottis: Secondary | ICD-10-CM | POA: Diagnosis not present

## 2021-04-22 ENCOUNTER — Other Ambulatory Visit: Payer: Self-pay | Admitting: *Deleted

## 2021-04-22 DIAGNOSIS — J439 Emphysema, unspecified: Secondary | ICD-10-CM

## 2021-04-22 DIAGNOSIS — Z720 Tobacco use: Secondary | ICD-10-CM

## 2021-04-22 MED ORDER — ALBUTEROL SULFATE HFA 108 (90 BASE) MCG/ACT IN AERS
1.0000 | INHALATION_SPRAY | Freq: Four times a day (QID) | RESPIRATORY_TRACT | 5 refills | Status: DC | PRN
Start: 2021-04-22 — End: 2021-08-19

## 2021-04-22 MED ORDER — EZETIMIBE 10 MG PO TABS: 10.0000 mg | ORAL_TABLET | Freq: Every day | ORAL | 2 refills | Status: DC

## 2021-04-22 MED ORDER — METOPROLOL TARTRATE 25 MG PO TABS: 12.5000 mg | ORAL_TABLET | Freq: Two times a day (BID) | ORAL | 2 refills | Status: DC

## 2021-04-22 MED ORDER — ISOSORBIDE MONONITRATE ER 30 MG PO TB24: 30.0000 mg | ORAL_TABLET | Freq: Every day | ORAL | 2 refills | Status: DC

## 2021-04-22 MED ORDER — FLUOCINONIDE 0.05 % EX CREA: 1.0000 "application " | TOPICAL_CREAM | Freq: Two times a day (BID) | CUTANEOUS | 5 refills | Status: DC | PRN

## 2021-04-22 NOTE — Telephone Encounter (Signed)
Fax from [facility] fax [phone] for Dr. Percival Spanish Request for refill on Brilinta  Also Repatha Sureclick 903 mg/ml SQ pen inj requires a PA [employer] uses [employer] to submit PA requests electronically

## 2021-05-19 ENCOUNTER — Ambulatory Visit (INDEPENDENT_AMBULATORY_CARE_PROVIDER_SITE_OTHER): Payer: Medicare HMO | Admitting: Nurse Practitioner

## 2021-05-19 ENCOUNTER — Encounter: Payer: Self-pay | Admitting: Nurse Practitioner

## 2021-05-19 DIAGNOSIS — R6889 Other general symptoms and signs: Secondary | ICD-10-CM | POA: Diagnosis not present

## 2021-05-19 DIAGNOSIS — Z20828 Contact with and (suspected) exposure to other viral communicable diseases: Secondary | ICD-10-CM | POA: Diagnosis not present

## 2021-05-19 NOTE — Progress Notes (Signed)
   Virtual Visit  Note Due to COVID-19 pandemic this visit was conducted virtually. This visit type was conducted due to national recommendations for restrictions regarding the COVID-19 Pandemic (e.g. social distancing, sheltering in place) in an effort to limit this patient's exposure and mitigate transmission in our community. All issues noted in this document were discussed and addressed.  A physical exam was not performed with this format.  I connected with [name] on 05/19/21 at 2:07 by telephone and verified that I am speaking with the correct person using two identifiers. [name] is currently located at home and his wife is currently with him during visit. The provider, Mary-Margaret Hassell Done, FNP is located in their office at time of visit.  I discussed the limitations, risks, security and privacy concerns of performing an evaluation and management service by telephone and the availability of in person appointments. I also discussed with the patient that there may be a patient responsible charge related to this service. The patient expressed understanding and agreed to proceed.   History and Present Illness:  Patient is c/o nasal congestion, headache, cough and body aches. This started Thursday. Granddaughter was dx with flu first part of last week. He has been taking theraflu OTC and that has caused nausea.      Review of Systems  Constitutional:  Positive for chills. Negative for diaphoresis, fever (no longer) and weight loss.  HENT:  Positive for congestion and sore throat.   Eyes:  Negative for blurred vision, double vision and pain.  Respiratory:  Positive for cough. Negative for shortness of breath.   Cardiovascular:  Negative for chest pain, palpitations, orthopnea and leg swelling.  Gastrointestinal:  Negative for abdominal pain.  Musculoskeletal:  Negative for myalgias.  Skin:  Negative for rash.  Neurological:  Negative for dizziness, sensory change, loss of  consciousness, weakness and headaches.  Endo/Heme/Allergies:  Negative for polydipsia. Does not bruise/bleed easily.  Psychiatric/Behavioral:  Negative for memory loss. The patient does not have insomnia.   All other systems reviewed and are negative.   Observations/Objective: Alert and oriented- answers all questions appropriately No distress Voice Hoarse Wet cough   Assessment and Plan: [name] in today with chief complaint of flu like symptoms  1. Flu-like symptoms  2. Exposure to the flu Out of treatment window for tamiflu treatment- has been sick for 4 days. Force fluids Rest Motrin or tylenol for body aches Mucinex OTC. If develops fever let us know.   Follow Up Instructions: prn    I discussed the assessment and treatment plan with the patient. The patient was provided an opportunity to ask questions and all were answered. The patient agreed with the plan and demonstrated an understanding of the instructions.   The patient was advised to call back or seek an in-person evaluation if the symptoms worsen or if the condition fails to improve as anticipated.  The above assessment and management plan was discussed with the patient. The patient verbalized understanding of and has agreed to the management plan. Patient is aware to call the clinic if symptoms persist or worsen. Patient is aware when to return to the clinic for a follow-up visit. Patient educated on when it is appropriate to go to the emergency department.   Time call ended:  2:20  I provided 13 minutes of  non face-to-face time during this encounter.    Mary-Margaret Hassell Done, FNP

## 2021-05-20 DIAGNOSIS — J069 Acute upper respiratory infection, unspecified: Secondary | ICD-10-CM | POA: Diagnosis not present

## 2021-05-20 DIAGNOSIS — H6691 Otitis media, unspecified, right ear: Secondary | ICD-10-CM | POA: Diagnosis not present

## 2021-08-20 ENCOUNTER — Ambulatory Visit (INDEPENDENT_AMBULATORY_CARE_PROVIDER_SITE_OTHER): Payer: Medicare HMO | Admitting: Family Medicine

## 2021-08-20 ENCOUNTER — Encounter: Payer: Self-pay | Admitting: Family Medicine

## 2021-08-20 VITALS — BP 134/79 | HR 51 | Ht 69.0 in | Wt 198.0 lb

## 2021-08-20 DIAGNOSIS — I25119 Atherosclerotic heart disease of native coronary artery with unspecified angina pectoris: Secondary | ICD-10-CM

## 2021-08-20 DIAGNOSIS — Z720 Tobacco use: Secondary | ICD-10-CM | POA: Diagnosis not present

## 2021-08-20 DIAGNOSIS — Z0001 Encounter for general adult medical examination with abnormal findings: Secondary | ICD-10-CM | POA: Diagnosis not present

## 2021-08-20 DIAGNOSIS — Z0000 Encounter for general adult medical examination without abnormal findings: Secondary | ICD-10-CM | POA: Diagnosis not present

## 2021-08-20 DIAGNOSIS — S46092A Other injury of muscle(s) and tendon(s) of the rotator cuff of left shoulder, initial encounter: Secondary | ICD-10-CM | POA: Diagnosis not present

## 2021-08-20 DIAGNOSIS — Z125 Encounter for screening for malignant neoplasm of prostate: Secondary | ICD-10-CM | POA: Diagnosis not present

## 2021-08-20 DIAGNOSIS — E782 Mixed hyperlipidemia: Secondary | ICD-10-CM | POA: Diagnosis not present

## 2021-08-20 DIAGNOSIS — J439 Emphysema, unspecified: Secondary | ICD-10-CM

## 2021-08-20 DIAGNOSIS — Z23 Encounter for immunization: Secondary | ICD-10-CM

## 2021-08-20 DIAGNOSIS — M1611 Unilateral primary osteoarthritis, right hip: Secondary | ICD-10-CM

## 2021-08-20 MED ORDER — ALBUTEROL SULFATE HFA 108 (90 BASE) MCG/ACT IN AERS: 1.0000 | INHALATION_SPRAY | Freq: Four times a day (QID) | RESPIRATORY_TRACT | 5 refills | Status: DC | PRN

## 2021-08-20 MED ORDER — EZETIMIBE 10 MG PO TABS: 10.0000 mg | ORAL_TABLET | Freq: Every day | ORAL | 3 refills | Status: DC

## 2021-08-20 MED ORDER — ISOSORBIDE MONONITRATE ER 30 MG PO TB24: 30.0000 mg | ORAL_TABLET | Freq: Every day | ORAL | 3 refills | Status: DC

## 2021-08-20 MED ORDER — METOPROLOL TARTRATE 25 MG PO TABS: 12.5000 mg | ORAL_TABLET | Freq: Two times a day (BID) | ORAL | 3 refills | Status: DC

## 2021-08-20 MED ORDER — SPIRIVA HANDIHALER 18 MCG IN CAPS: 18.0000 ug | ORAL_CAPSULE | Freq: Every day | RESPIRATORY_TRACT | 3 refills | Status: DC

## 2021-08-20 NOTE — Progress Notes (Signed)
BP 134/79    Pulse (!) 51    Ht '5\' 9"'  (1.753 m)    Wt 198 lb (89.8 kg)    SpO2 98%    BMI 29.24 kg/m    Subjective:   Patient ID: [name], male    DOB: 03-25-1951, 71 y.o.   MRN: [id number]  HPI: [name] is a 71 y.o. male presenting on 08/20/2021 for Medical Management of Chronic Issues (CPE)   HPI Physical exam Patient is coming in for physical exam and arthritic issues.  He says he complains of left shoulder pains and aches especially when he tries to lift something and right hip pains and aches when he is walking.  He also hurts all the way down his left leg from circulatory issues which they have looked into but do not feel like they can restore the circulation to his lower leg.  Patient does still smoke.  He takes the occasional naproxen because on a blood thinner.  He does say his arthritis sometimes wakes him up at night in the left shoulder.  The right hip hurts more when he walks.  Hyperlipidemia and CAD Patient is coming in for recheck of his hyperlipidemia. The patient is currently taking Zetia and Repatha and Brilinta and Imdur and metoprolol. They deny any issues with myalgias or history of liver damage from it. They deny any focal numbness or weakness or chest pain.   COPD Patient is coming in for COPD recheck today.  He is currently on albuterol and Spiriva.  He has a mild chronic cough but denies any major coughing spells or wheezing spells.  He has 4nighttime symptoms per week and 4daytime symptoms per week currently.   Patient has a history of prostate cancer and we will recheck PSA.  Relevant past medical, surgical, family and social history reviewed and updated as indicated. Interim medical history since our last visit reviewed. Allergies and medications reviewed and updated.  Review of Systems  Constitutional:  Negative for chills and fever.  HENT:  Negative for ear pain and tinnitus.   Eyes:  Negative for pain and visual disturbance.  Respiratory:   Negative for cough, shortness of breath and wheezing.   Cardiovascular:  Negative for chest pain, palpitations and leg swelling.  Gastrointestinal:  Negative for abdominal pain, blood in stool, constipation and diarrhea.  Genitourinary:  Negative for dysuria and hematuria.  Musculoskeletal:  Positive for arthralgias and myalgias. Negative for back pain, gait problem, neck pain and neck stiffness.  Skin:  Negative for rash.  Neurological:  Negative for dizziness, weakness and headaches.  Psychiatric/Behavioral:  Negative for suicidal ideas.   All other systems reviewed and are negative.  Per HPI unless specifically indicated above   Allergies as of 08/20/2021       Reactions   Contrast Media [iodinated Contrast Media]    Statins Other (See Comments)   Muscle pain   Other Itching, Swelling, Dermatitis   IV dye        Medication List        Accurate as of August 20, 2021 10:30 AM. If you have any questions, ask your nurse or doctor.          albuterol 108 (90 Base) MCG/ACT inhaler Commonly known as: ProAir HFA Inhale 1-2 puffs into the lungs every 6 (six) hours as needed for wheezing or shortness of breath.   aspirin EC 81 MG tablet Take 1 tablet (81 mg total) by mouth daily.   Brilinta 90  MG Tabs tablet Generic drug: ticagrelor TAKE (1) TABLET TWICE A DAY.   ezetimibe 10 MG tablet Commonly known as: ZETIA Take 1 tablet (10 mg total) by mouth daily.   fluocinonide cream 0.05 % Commonly known as: LIDEX Apply 1 application topically 2 (two) times daily as needed.   isosorbide mononitrate 30 MG 24 hr tablet Commonly known as: IMDUR Take 1 tablet (30 mg total) by mouth daily.   metoprolol tartrate 25 MG tablet Commonly known as: LOPRESSOR Take 0.5 tablets (12.5 mg total) by mouth 2 (two) times daily.   nitroGLYCERIN 0.4 MG SL tablet Commonly known as: NITROSTAT Place 1 tablet (0.4 mg total) under the tongue every 5 (five) minutes as needed for chest pain (up  to 3 doses).   Repatha SureClick 161 MG/ML Soaj Generic drug: Evolocumab Inject 140 mg as directed every 14 (fourteen) days.   Spiriva HandiHaler 18 MCG inhalation capsule Generic drug: tiotropium Place 1 capsule (18 mcg total) into inhaler and inhale daily. Place on file until patient asked for prescription         Objective:   BP 134/79    Pulse (!) 51    Ht '5\' 9"'  (1.753 m)    Wt 198 lb (89.8 kg)    SpO2 98%    BMI 29.24 kg/m   Wt Readings from Last 3 Encounters:  08/20/21 198 lb (89.8 kg)  02/17/21 194 lb (88 kg)  12/25/20 196 lb (88.9 kg)    Physical Exam Vitals reviewed.  Constitutional:      General: He is not in acute distress.    Appearance: He is well-developed. He is not diaphoretic.  HENT:     Right Ear: External ear normal.     Left Ear: External ear normal.     Nose: Nose normal.     Mouth/Throat:     Pharynx: No oropharyngeal exudate.  Eyes:     General: No scleral icterus.    Conjunctiva/sclera: Conjunctivae normal.  Neck:     Thyroid: No thyromegaly.  Cardiovascular:     Rate and Rhythm: Normal rate and regular rhythm.     Heart sounds: Normal heart sounds. No murmur heard. Pulmonary:     Effort: Pulmonary effort is normal. No respiratory distress.     Breath sounds: Normal breath sounds. No wheezing.  Abdominal:     General: Bowel sounds are normal. There is no distension.     Palpations: Abdomen is soft.     Tenderness: There is no abdominal tenderness (Small point tenderness but on reexam was not there). There is no guarding or rebound.  Musculoskeletal:        General: No swelling. Normal range of motion.     Left shoulder: Tenderness (Positive empty can test and pain with external rotation) present. No bony tenderness or crepitus. Normal range of motion. Normal strength.     Cervical back: Neck supple.     Right hip: Crepitus (Pain with rotation and slight crepitus) present. No deformity or tenderness. Normal range of motion. Normal strength.   Lymphadenopathy:     Cervical: No cervical adenopathy.  Skin:    General: Skin is warm and dry.     Findings: No rash.  Neurological:     Mental Status: He is alert and oriented to person, place, and time.     Coordination: Coordination normal.  Psychiatric:        Behavior: Behavior normal.      Assessment & Plan:   Problem List  Items Addressed This Visit       Cardiovascular and Mediastinum   Coronary artery disease involving native coronary artery of native heart with angina pectoris (HCC)   Relevant Medications   ezetimibe (ZETIA) 10 MG tablet   isosorbide mononitrate (IMDUR) 30 MG 24 hr tablet   metoprolol tartrate (LOPRESSOR) 25 MG tablet   Other Relevant Orders   Lipid panel     Respiratory   COPD (chronic obstructive pulmonary disease) (HCC)   Relevant Medications   albuterol (PROAIR HFA) 108 (90 Base) MCG/ACT inhaler   tiotropium (SPIRIVA HANDIHALER) 18 MCG inhalation capsule   Other Relevant Orders   CBC with Differential/Platelet     Other   Hyperlipidemia   Relevant Medications   albuterol (PROAIR HFA) 108 (90 Base) MCG/ACT inhaler   ezetimibe (ZETIA) 10 MG tablet   isosorbide mononitrate (IMDUR) 30 MG 24 hr tablet   metoprolol tartrate (LOPRESSOR) 25 MG tablet   tiotropium (SPIRIVA HANDIHALER) 18 MCG inhalation capsule   Other Relevant Orders   CBC with Differential/Platelet   CMP14+EGFR   Lipid panel   PSA, total and free   Tobacco abuse   Relevant Medications   albuterol (PROAIR HFA) 108 (90 Base) MCG/ACT inhaler   tiotropium (SPIRIVA HANDIHALER) 18 MCG inhalation capsule   Other Visit Diagnoses     Physical exam    -  Primary   Relevant Orders   CBC with Differential/Platelet   CMP14+EGFR   Lipid panel   PSA, total and free   Need for pneumococcal vaccination       Relevant Orders   Pneumococcal conjugate vaccine 20-valent (Prevnar 20) (Completed)   Other injury of muscle(s) and tendon(s) of the rotator cuff of left shoulder, initial  encounter       Primary osteoarthritis of right hip           Continue current medicine.  Discussed going to see orthopedic to help with arthritic conditions for possible injections.  Patient is not a good surgical candidate.  He takes the occasional naproxen and recommended that he could switch in some Tylenol as well. Follow up plan: Return in about 6 months (around 02/17/2022), or if symptoms worsen or fail to improve, for COPD and cholesterol recheck.  Counseling provided for all of the vaccine components Orders Placed This Encounter  Procedures   Pneumococcal conjugate vaccine 20-valent (Prevnar 20)   CBC with Differential/Platelet   CMP14+EGFR   Lipid panel   PSA, total and free    Caryl Pina, MD [facility] 08/20/2021, 10:30 AM

## 2021-08-21 LAB — CBC WITH DIFFERENTIAL/PLATELET
Basophils Absolute: 0.1 10*3/uL (ref 0.0–0.2)
Basos: 1 %
EOS (ABSOLUTE): 0.2 10*3/uL (ref 0.0–0.4)
Eos: 2 %
Hematocrit: 47.4 % (ref 37.5–51.0)
Hemoglobin: 16.1 g/dL (ref 13.0–17.7)
Immature Grans (Abs): 0 10*3/uL (ref 0.0–0.1)
Immature Granulocytes: 0 %
Lymphocytes Absolute: 2.3 10*3/uL (ref 0.7–3.1)
Lymphs: 29 %
MCH: 30.7 pg (ref 26.6–33.0)
MCHC: 34 g/dL (ref 31.5–35.7)
MCV: 91 fL (ref 79–97)
Monocytes Absolute: 0.6 10*3/uL (ref 0.1–0.9)
Monocytes: 7 %
Neutrophils Absolute: 5 10*3/uL (ref 1.4–7.0)
Neutrophils: 61 %
Platelets: 210 10*3/uL (ref 150–450)
RBC: 5.24 x10E6/uL (ref 4.14–5.80)
RDW: 14 % (ref 11.6–15.4)
WBC: 8.1 10*3/uL (ref 3.4–10.8)

## 2021-08-21 LAB — CMP14+EGFR
ALT: 18 IU/L (ref 0–44)
AST: 20 IU/L (ref 0–40)
Albumin/Globulin Ratio: 1.9 (ref 1.2–2.2)
Albumin: 4.4 g/dL (ref 3.8–4.8)
Alkaline Phosphatase: 88 IU/L (ref 44–121)
BUN/Creatinine Ratio: 13 (ref 10–24)
BUN: 13 mg/dL (ref 8–27)
Bilirubin Total: 0.5 mg/dL (ref 0.0–1.2)
CO2: 22 mmol/L (ref 20–29)
Calcium: 9.1 mg/dL (ref 8.6–10.2)
Chloride: 104 mmol/L (ref 96–106)
Creatinine, Ser: 0.97 mg/dL (ref 0.76–1.27)
Globulin, Total: 2.3 g/dL (ref 1.5–4.5)
Glucose: 89 mg/dL (ref 70–99)
Potassium: 4.3 mmol/L (ref 3.5–5.2)
Sodium: 140 mmol/L (ref 134–144)
Total Protein: 6.7 g/dL (ref 6.0–8.5)
eGFR: 85 mL/min/{1.73_m2} (ref >59)

## 2021-08-21 LAB — PSA, TOTAL AND FREE
PSA, Free: <0.02 ng/mL
Prostate Specific Ag, Serum: <0.1 ng/mL (ref 0.0–4.0)

## 2021-08-21 LAB — LIPID PANEL
Chol/HDL Ratio: 2.4 ratio (ref 0.0–5.0)
Cholesterol, Total: 122 mg/dL (ref 100–199)
HDL: 51 mg/dL (ref >39)
LDL Chol Calc (NIH): 45 mg/dL (ref 0–99)
Triglycerides: 158 mg/dL — ABNORMAL HIGH (ref 0–149)
VLDL Cholesterol Cal: 26 mg/dL (ref 5–40)

## 2021-09-04 ENCOUNTER — Ambulatory Visit (INDEPENDENT_AMBULATORY_CARE_PROVIDER_SITE_OTHER): Payer: Medicare HMO

## 2021-09-04 VITALS — Wt 195.0 lb

## 2021-09-04 DIAGNOSIS — Z0000 Encounter for general adult medical examination without abnormal findings: Secondary | ICD-10-CM

## 2021-09-04 DIAGNOSIS — I509 Heart failure, unspecified: Secondary | ICD-10-CM | POA: Diagnosis not present

## 2021-09-04 NOTE — Progress Notes (Signed)
Subjective:   [name] is a 71 y.o. male who presents for Medicare Annual/Subsequent preventive examination.  Virtual Visit via Telephone Note  I connected with  [name] on 09/04/21 at  8:15 AM EST by telephone and verified that I am speaking with the correct person using two identifiers.  Location: Patient: Home Provider: [facility] Persons participating in the virtual visit: patient/Nurse Health Advisor   I discussed the limitations, risks, security and privacy concerns of performing an evaluation and management service by telephone and the availability of in person appointments. The patient expressed understanding and agreed to proceed.  Interactive audio and video telecommunications were attempted between this nurse and patient, however failed, due to patient having technical difficulties OR patient did not have access to video capability.  We continued and completed visit with audio only.  Some vital signs may be absent or patient reported.   Whisper Kurka E Jolanda Mccann, LPN   Review of Systems     Cardiac Risk Factors include: advanced age (>19mn, >>54women);male gender;dyslipidemia;family history of premature cardiovascular disease;smoking/ tobacco exposure;Other (see comment), Risk factor comments: CAD, PAD, hx of stroke, COPD     Objective:    Today's Vitals   09/04/21 0820  Weight: 195 lb (88.5 kg)  PainSc: 3    Body mass index is 28.8 kg/m.  Advanced Directives 09/04/2021 09/03/2020 01/31/2020 06/02/2018 05/11/2018 11/04/2017 11/03/2016  Does Patient Have a Medical Advance Directive? No No No No No No No  Would patient like information on creating a medical advance directive? No - Patient declined No - Patient declined No - Patient declined No - Patient declined - No - Patient declined No - Patient declined  Pre-existing out of facility DNR order (yellow form or pink MOST form) - - - - - - -    Current Medications (verified) Outpatient Encounter Medications as of 09/04/2021   Medication Sig   albuterol (PROAIR HFA) 108 (90 Base) MCG/ACT inhaler Inhale 1-2 puffs into the lungs every 6 (six) hours as needed for wheezing or shortness of breath.   aspirin EC 81 MG tablet Take 1 tablet (81 mg total) by mouth daily.   BRILINTA 90 MG TABS tablet TAKE (1) TABLET TWICE A DAY.   ezetimibe (ZETIA) 10 MG tablet Take 1 tablet (10 mg total) by mouth daily.   fluocinonide cream (LIDEX) 03.01% Apply 1 application topically 2 (two) times daily as needed.   isosorbide mononitrate (IMDUR) 30 MG 24 hr tablet Take 1 tablet (30 mg total) by mouth daily.   metoprolol tartrate (LOPRESSOR) 25 MG tablet Take 0.5 tablets (12.5 mg total) by mouth 2 (two) times daily.   nitroGLYCERIN (NITROSTAT) 0.4 MG SL tablet Place 1 tablet (0.4 mg total) under the tongue every 5 (five) minutes as needed for chest pain (up to 3 doses).   REPATHA SURECLICK 1601MG/ML SOAJ Inject 140 mg as directed every 14 (fourteen) days.   tiotropium (SPIRIVA HANDIHALER) 18 MCG inhalation capsule Place 1 capsule (18 mcg total) into inhaler and inhale daily. Place on file until patient asked for prescription   No facility-administered encounter medications on file as of 09/04/2021.    Allergies (verified) Contrast media [iodinated contrast media], Statins, and Other   History: Past Medical History:  Diagnosis Date   CAD (coronary artery disease) 12/2008   a. cath 11/2011 s/p DES to LAD b. anterior STEMI 09/04/2014 cath DES to occluded prox LAD, 40% AV groove LCx and 30% mid RCA   Cancer [place] [place] 06/2012  prostate, larynx   CHF (congestive heart failure) (HCC)    COPD (chronic obstructive pulmonary disease) (HCC)    CVA (cerebral infarction) 06/2009   Elevated PSA 02/2012   6.54   Hyperlipidemia    intolerant to statins   Myocardial infarction [facility])    PVD (peripheral vascular disease) ([place])    Aortobifemoral Bypass 2010, Thrombectomy and repair right brachial artery    Radiation 08/10/2012-09/22/2012   6525 cGy to larynx    Stroke [facility])    Tobacco abuse    Tubular adenoma    Unstable angina [facility])    Past Surgical History:  Procedure Laterality Date   Aortobifemoral bypass  2010   COLONOSCOPY W/ BIOPSIES     CORONARY ANGIOPLASTY WITH STENT PLACEMENT  11/2011   larynx biopsy  07/12/12   LEFT HEART CATHETERIZATION WITH CORONARY ANGIOGRAM N/A 12/17/2011   Procedure: LEFT HEART CATHETERIZATION WITH CORONARY ANGIOGRAM;  Surgeon: Hillary Bow, MD;  Location: [facility] CATH LAB;  Service: Cardiovascular;  Laterality: N/A;   LEFT HEART CATHETERIZATION WITH CORONARY ANGIOGRAM N/A 09/04/2014   Procedure: LEFT HEART CATHETERIZATION WITH CORONARY ANGIOGRAM;  Surgeon: Troy Sine, MD;  Location: [facility] CATH LAB;  Service: Cardiovascular;  Laterality: N/A;   LOWER EXTREMITY ANGIOGRAPHY Left 09/14/2016   Procedure: Lower Extremity Angiography;  Surgeon: Lorretta Harp, MD;  Location: [place] CV LAB;  Service: Cardiovascular;  Laterality: Left;   PERCUTANEOUS CORONARY STENT INTERVENTION (PCI-S) N/A 12/17/2011   Procedure: PERCUTANEOUS CORONARY STENT INTERVENTION (PCI-S);  Surgeon: Hillary Bow, MD;  Location: [facility] CATH LAB;  Service: Cardiovascular;  Laterality: N/A;   PROSTATE BIOPSY  07/12/12   Gleason's grade 3+3+6   PROSTATECTOMY  12/2015   Thrombectomy and brachial artery repair     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   Family History  Problem Relation Age of Onset   Coronary artery disease Father 38   Heart disease Father    Heart attack Father 62   Coronary artery disease Brother 42       died with AMI   Heart disease Brother    Breast cancer Mother    Cancer Sister        uterine   Stroke Maternal Grandmother    Cancer Paternal Grandmother    Healthy Daughter    Healthy Son    Heart attack Brother    Healthy Son    Social History   Socioeconomic History   Marital status: Married    Spouse name: [name]   Number of children: 3   Years of education: 12   Highest education  level: High school graduate  Occupational History   Occupation: truck Geophysicist/field seismologist    Comment: 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  Tobacco Use   Smoking status: Every Day    Packs/day: 0.50    Years: 51.00    Pack years: 25.50    Types: Cigarettes    Start date: 10/07/1967   Smokeless tobacco: Never  Vaping Use   Vaping Use: Never used  Substance and Sexual Activity   Alcohol use: No    Alcohol/week: 0.0 standard drinks   Drug use: No   Sexual activity: Yes  Other Topics Concern   Not on file  Social History Narrative   Patient lives with his wife [name]) - children and grandchildren also live with him   [facility] Benefits      Disabled/ Retired now   Ambulance person school.   Patient right handed   Social Determinants of  Health   Financial Resource Strain: Low Risk    Difficulty of Paying Living Expenses: Not hard at all  Food Insecurity: No Food Insecurity   Worried About [place] in the Last Year: Never true   Ran Out of Food in the Last Year: Never true  Transportation Needs: No Transportation Needs   Lack of Transportation (Medical): No   Lack of Transportation (Non-Medical): No  Physical Activity: Sufficiently Active   Days of Exercise per Week: 7 days   Minutes of Exercise per Session: 30 min  Stress: No Stress Concern Present   Feeling of Stress : Not at all  Social Connections: Moderately Isolated   Frequency of Communication with Friends and Family: Never   Frequency of Social Gatherings with Friends and Family: More than three times a week   Attends Religious Services: Never   Marine scientist or Organizations: No   Attends Music therapist: Never   Marital Status: Married    Tobacco Counseling Ready to quit: Not Answered Counseling given: Not Answered   Clinical Intake:  Pre-visit preparation completed: Yes  Pain : 0-10 Pain Score: 3  Pain Type: Chronic pain Pain Location: Generalized Pain Descriptors / Indicators: Aching,  Discomfort Pain Onset: More than a month ago Pain Frequency: Intermittent     BMI - recorded: 28.8 Nutritional Status: BMI 25 -29 Overweight Nutritional Risks: None Diabetes: No  How often do you need to have someone help you when you read instructions, pamphlets, or other written materials from your doctor or pharmacy?: 1 - Never  Diabetic? no  Interpreter Needed?: No  Information entered by :: Vang Kraeger, LPN   Activities of Daily Living In your present state of health, do you have any difficulty performing the following activities: 09/04/2021  Hearing? N  Vision? N  Difficulty concentrating or making decisions? N  Walking or climbing stairs? N  Dressing or bathing? N  Doing errands, shopping? N  Preparing Food and eating ? N  Using the Toilet? N  In the past six months, have you accidently leaked urine? Y  Comment hx prostate cancer with prostatectomy  Do you have problems with loss of bowel control? N  Managing your Medications? N  Managing your Finances? N  Housekeeping or managing your Housekeeping? N  Some recent data might be hidden    Patient Care Team: Dettinger, Fransisca Kaufmann, MD as PCP - General (Family Medicine) Minus Breeding, MD as PCP - Cardiology (Cardiology) Myrlene Broker, MD as Attending Physician (Urology) Dorothe Pea, MD as Referring Physician (Otolaryngology) Minus Breeding, MD as Consulting Physician (Cardiology) Angelia Mould, MD as Consulting Physician (Vascular Surgery) Ilean China, RN as Case Manager Abigail Miyamoto, MD as Consulting Physician (Diagnostic Radiology)  Indicate any recent Medical Services you may have received from other than [facility] providers in the past year (date may be approximate).     Assessment:   This is a routine wellness examination for [name].  Hearing/Vision screen Hearing Screening - Comments:: Denies hearing difficulties   Vision Screening - Comments:: Wears rx glasses - [facility]  Dietary issues and exercise activities discussed: Current Exercise Habits: Home exercise routine, Type of exercise: walking, Time (Minutes): 30, Frequency (Times/Week): 7, Weekly Exercise (Minutes/Week): 210, Intensity: Mild, Exercise limited by: orthopedic condition(s);respiratory conditions(s);cardiac condition(s)   Goals Addressed               This Visit's Progress     "  I don't want to fall" (pt-stated)   On track     Current Barriers:  Chronic Disease Management support and education needs related to latent effects of CVA  Nurse Case Manager Clinical Goal(s):  Over the next 30 days, patient will meet with RN Care Manager to address fall prevention in the home  Interventions:  Advised patient to move carefully and purposefully to prevent falls Provided education to patient re: falls prevention in the home (oral education provided and EMMI handouts to be printed and mailed)  Patient Self Care Activities:  Performs ADL's independently Performs IADL's independently  Initial goal documentation       Exercise 3x per week (30 min per time)   On track      Depression Screen PHQ 2/9 Scores 09/04/2021 08/20/2021 02/17/2021 09/03/2020 08/16/2020 01/16/2019 01/11/2019  PHQ - 2 Score 0 0 0 0 0 0 0    Fall Risk Fall Risk  09/04/2021 08/20/2021 02/17/2021 09/03/2020 08/16/2020  Falls in the past year? 0 0 0 0 0  Number falls in past yr: 0 - - - 0  Injury with Fall? 0 - - - 0  Risk for fall due to : Orthopedic patient - - - -  Risk for fall due to: Comment - - - - -  Follow up Falls prevention discussed - - Falls evaluation completed Falls evaluation completed    [place]:  Any stairs in or around the home? No  If so, are there any without handrails? No  Home free of loose throw rugs in walkways, pet beds, electrical cords, etc? Yes  Adequate lighting in your home to reduce risk of falls? Yes   ASSISTIVE DEVICES UTILIZED TO PREVENT  FALLS:  Life alert? No  Use of a cane, walker or w/c? No  Grab bars in the bathroom? Yes  Shower chair or bench in shower? No  Elevated toilet seat or a handicapped toilet? No   TIMED UP AND GO:  Was the test performed? No . Telephonic visit  Cognitive Function: Normal cognitive status assessed by direct observation by this Nurse Health Advisor. No abnormalities found.   MMSE - Mini Mental State Exam 11/04/2017 11/03/2016  Orientation to time 5 5  Orientation to Place 5 5  Registration 3 3  Attention/ Calculation 5 5  Recall 3 3  Language- name 2 objects 2 2  Language- repeat 1 1  Language- follow 3 step command 3 3  Language- read & follow direction 1 1  Write a sentence 1 1  Copy design 1 1  Total score 30 30     6CIT Screen 09/03/2020  What Year? 0 points  What month? 0 points  What time? 0 points  Count back from 20 0 points  Months in reverse 0 points  Repeat phrase 0 points  Total Score 0    Immunizations Immunization History  Administered Date(s) Administered   PNEUMOCOCCAL CONJUGATE-20 08/20/2021   Pneumococcal Conjugate-13 08/16/2020   Tdap 11/03/2016    TDAP status: Up to date  Flu Vaccine status: Declined, Education has been provided regarding the importance of this vaccine but patient still declined. Advised may receive this vaccine at local pharmacy or Health Dept. Aware to provide a copy of the vaccination record if obtained from local pharmacy or Health Dept. Verbalized acceptance and understanding.  Pneumococcal vaccine status: Up to date  Covid-19 vaccine status: Declined, Education has been provided regarding the importance of this vaccine but patient  still declined. Advised may receive this vaccine at local pharmacy or Health Dept.or vaccine clinic. Aware to provide a copy of the vaccination record if obtained from local pharmacy or Health Dept. Verbalized acceptance and understanding.  Qualifies for Shingles Vaccine? Yes   Zostavax completed No    Shingrix Completed?: No.    Education has been provided regarding the importance of this vaccine. Patient has been advised to call insurance company to determine out of pocket expense if they have not yet received this vaccine. Advised may also receive vaccine at local pharmacy or Health Dept. Verbalized acceptance and understanding.  Screening Tests Health Maintenance  Topic Date Due   Fecal DNA (Cologuard)  Never done   INFLUENZA VACCINE  09/26/2021 (Originally 01/27/2021)   Zoster Vaccines- Shingrix (1 of 2) 05/18/2022 (Originally 05/13/1970)   TETANUS/TDAP  11/04/2026   Pneumonia Vaccine 57+ Years old  Completed   Hepatitis C Screening  Completed   HPV VACCINES  Aged Out   COLONOSCOPY (Pts 45-52yr Insurance coverage will need to be confirmed)  Discontinued   COVID-19 Vaccine  Discontinued    Health Maintenance  Health Maintenance Due  Topic Date Due   Fecal DNA (Cologuard)  Never done    Colorectal cancer screening: Type of screening: Colonoscopy. Completed 08/15/2007. Repeat every 10 years - He has a Cologuard test at home he has yet to return  Lung Cancer Screening: (Low Dose CT Chest recommended if Age 764-80years, 30 pack-year currently smoking OR have quit w/in 15years.) does qualify.   Lung Cancer Screening Referral: he had last one 09/14/2018 - due to his co-morbidities, not sure if he qualifies - I [facility] Additional Screening:  Hepatitis C Screening: does qualify; Completed 01/07/2018  Vision Screening: Recommended annual ophthalmology exams for early detection of glaucoma and other disorders of the eye. Is the patient up to date with their annual eye exam?  No  Who is the provider or what is the name of the office in which the patient attends annual eye exams[facility] provider, would they like to be referred to a provider to establish care? No .   Dental Screening: Recommended annual  dental exams for proper oral hygiene  Community Resource Referral / Chronic Care Management: CRR required this visit?  No   CCM required this visit?  No      Plan:     I have personally reviewed and noted the following in the patients chart:   Medical and social history Use of alcohol, tobacco or illicit drugs  Current medications and supplements including opioid prescriptions. Patient is not currently taking opioid prescriptions. Functional ability and status Nutritional status Physical activity Advanced directives List of other physicians Hospitalizations, surgeries, and ER visits in previous 12 months Vitals Screenings to include cognitive, depression, and falls Referrals and appointments  In addition, I have reviewed and discussed with patient certain preventive protocols, quality metrics, and best practice recommendations. A written personalized care plan for preventive services as well as general preventive health recommendations were provided to patient.     ASandrea Hammond LPN   37/09/1285  Nurse Notes: He had low dose lung cancer screening in 2020 - he now has laryngeal cancer, so I am not sure if he qualifies

## 2021-09-04 NOTE — Patient Instructions (Signed)
[name] , Thank you for taking time to come for your Medicare Wellness Visit. I appreciate your ongoing commitment to your health goals. Please review the following plan we discussed and let me know if I can assist you in the future.   Screening recommendations/referrals: Colonoscopy: Done 08/15/2007 - repeat in 10 years or do Cologuard - Return your cologuard test soon Recommended yearly ophthalmology/optometry visit for glaucoma screening and checkup Recommended yearly dental visit for hygiene and checkup  Vaccinations: Influenza vaccine: Declined Pneumococcal vaccine: Done 08/16/20 & 08/20/21 Tdap vaccine: Done 11/03/2016 - Repeat in 10 years  Shingles vaccine: Declined   Covid-19: Declined  Advanced directives: Advance directive discussed with you today. Even though you declined this today, please call our office should you change your mind, and we can give you the proper paperwork for you to fill out.   Conditions/risks identified: Aim for 30 minutes of exercise or brisk walking, 6-8 glasses of water, and 5 servings of fruits and vegetables each day.  If you wish to quit smoking, help is available. For free tobacco cessation program offerings call the [facility] at 732-528-2650 or Live Well Line at (717)629-5538. You may also visit www.[place].com or email [email or web address] .[email or web address] for more information on other programs.   You may also call 1-800-QUIT-NOW 304-201-0840) or visit www.VirusCrisis.dk or www.BecomeAnEx.org for additional resources on smoking cessation.    Next appointment: Follow up in one year for your annual wellness visit.   Preventive Care 54 Years and Older, Male  Preventive care refers to lifestyle choices and visits with your health care provider that can promote health and wellness. What does preventive care include? A yearly physical exam. This is also called an annual well check. Dental exams once or twice a year. Routine eye exams. Ask  your health care provider how often you should have your eyes checked. Personal lifestyle choices, including: Daily care of your teeth and gums. Regular physical activity. Eating a healthy diet. Avoiding tobacco and drug use. Limiting alcohol use. Practicing safe sex. Taking low doses of aspirin every day. Taking vitamin and mineral supplements as recommended by your health care provider. What happens during an annual well check? The services and screenings done by your health care provider during your annual well check will depend on your age, overall health, lifestyle risk factors, and family history of disease. Counseling  Your health care provider may ask you questions about your: Alcohol use. Tobacco use. Drug use. Emotional well-being. Home and relationship well-being. Sexual activity. Eating habits. History of falls. Memory and ability to understand (cognition). Work and work Statistician. Screening  You may have the following tests or measurements: Height, weight, and BMI. Blood pressure. Lipid and cholesterol levels. These may be checked every 5 years, or more frequently if you are over 64 years old. Skin check. Lung cancer screening. You may have this screening every year starting at age 29 if you have a 30-pack-year history of smoking and currently smoke or have quit within the past 15 years. Fecal occult blood test (FOBT) of the stool. You may have this test every year starting at age 60. Flexible sigmoidoscopy or colonoscopy. You may have a sigmoidoscopy every 5 years or a colonoscopy every 10 years starting at age 40. Prostate cancer screening. Recommendations will vary depending on your family history and other risks. Hepatitis C blood test. Hepatitis B blood test. Sexually transmitted disease (STD) testing. Diabetes screening. This is done by checking your blood sugar (glucose) after  you have not eaten for a while (fasting). You may have this done every 1-3  years. Abdominal aortic aneurysm (AAA) screening. You may need this if you are a current or former smoker. Osteoporosis. You may be screened starting at age 68 if you are at high risk. Talk with your health care provider about your test results, treatment options, and if necessary, the need for more tests. Vaccines  Your health care provider may recommend certain vaccines, such as: Influenza vaccine. This is recommended every year. Tetanus, diphtheria, and acellular pertussis (Tdap, Td) vaccine. You may need a Td booster every 10 years. Zoster vaccine. You may need this after age 68. Pneumococcal 13-valent conjugate (PCV13) vaccine. One dose is recommended after age 61. Pneumococcal polysaccharide (PPSV23) vaccine. One dose is recommended after age 79. Talk to your health care provider about which screenings and vaccines you need and how often you need them. This information is not intended to replace advice given to you by your health care provider. Make sure you discuss any questions you have with your health care provider. Document Released: 07/12/2015 Document Revised: 03/04/2016 Document Reviewed: 04/16/2015 Elsevier Interactive Patient Education  2017 [place] Prevention in the Home Falls can cause injuries. They can happen to people of all ages. There are many things you can do to make your home safe and to help prevent falls. What can I do on the outside of my home? Regularly fix the edges of walkways and driveways and fix any cracks. Remove anything that might make you trip as you walk through a door, such as a raised step or threshold. Trim any bushes or trees on the path to your home. Use bright outdoor lighting. Clear any walking paths of anything that might make someone trip, such as rocks or tools. Regularly check to see if handrails are loose or broken. Make sure that both sides of any steps have handrails. Any raised decks and porches should have guardrails on the  edges. Have any leaves, snow, or ice cleared regularly. Use sand or salt on walking paths during winter. Clean up any spills in your garage right away. This includes oil or grease spills. What can I do in the bathroom? Use night lights. Install grab bars by the toilet and in the tub and shower. Do not use towel bars as grab bars. Use non-skid mats or decals in the tub or shower. If you need to sit down in the shower, use a plastic, non-slip stool. Keep the floor dry. Clean up any water that spills on the floor as soon as it happens. Remove soap buildup in the tub or shower regularly. Attach bath mats securely with double-sided non-slip rug tape. Do not have throw rugs and other things on the floor that can make you trip. What can I do in the bedroom? Use night lights. Make sure that you have a light by your bed that is easy to reach. Do not use any sheets or blankets that are too big for your bed. They should not hang down onto the floor. Have a firm chair that has side arms. You can use this for support while you get dressed. Do not have throw rugs and other things on the floor that can make you trip. What can I do in the kitchen? Clean up any spills right away. Avoid walking on wet floors. Keep items that you use a lot in easy-to-reach places. If you need to reach something above you, use a strong  step stool that has a grab bar. Keep electrical cords out of the way. Do not use floor polish or wax that makes floors slippery. If you must use wax, use non-skid floor wax. Do not have throw rugs and other things on the floor that can make you trip. What can I do with my stairs? Do not leave any items on the stairs. Make sure that there are handrails on both sides of the stairs and use them. Fix handrails that are broken or loose. Make sure that handrails are as long as the stairways. Check any carpeting to make sure that it is firmly attached to the stairs. Fix any carpet that is loose or  worn. Avoid having throw rugs at the top or bottom of the stairs. If you do have throw rugs, attach them to the floor with carpet tape. Make sure that you have a light switch at the top of the stairs and the bottom of the stairs. If you do not have them, ask someone to add them for you. What else can I do to help prevent falls? Wear shoes that: Do not have high heels. Have rubber bottoms. Are comfortable and fit you well. Are closed at the toe. Do not wear sandals. If you use a stepladder: Make sure that it is fully opened. Do not climb a closed stepladder. Make sure that both sides of the stepladder are locked into place. Ask someone to hold it for you, if possible. Clearly mark and make sure that you can see: Any grab bars or handrails. First and last steps. Where the edge of each step is. Use tools that help you move around (mobility aids) if they are needed. These include: Canes. Walkers. Scooters. Crutches. Turn on the lights when you go into a dark area. Replace any light bulbs as soon as they burn out. Set up your furniture so you have a clear path. Avoid moving your furniture around. If any of your floors are uneven, fix them. If there are any pets around you, be aware of where they are. Review your medicines with your doctor. Some medicines can make you feel dizzy. This can increase your chance of falling. Ask your doctor what other things that you can do to help prevent falls. This information is not intended to replace advice given to you by your health care provider. Make sure you discuss any questions you have with your health care provider. Document Released: 04/11/2009 Document Revised: 11/21/2015 Document Reviewed: 07/20/2014 Elsevier Interactive Patient Education  2017 [employer].

## 2021-10-12 NOTE — Progress Notes (Signed)
?  ?Cardiology Office Note ? ? ?Date:  10/15/2021  ? ?ID:  [name], DOB 01-30-1951, MRN [id number] ? ?PCP:  Dettinger, Fransisca Kaufmann, MD  ?Cardiologist:   Minus Breeding, MD ? ? ?Chief Complaint  ?Patient presents with  ? Coronary Artery Disease  ? ? ?  ?History of Present Illness: ?[name] is a 71 y.o. male who presents for follow up of CAD.  He was hospitalized in March of 2016 with an acute anterior myocardial infarction.  Catheterization demonstrated total occlusion of the LAD proximal to his previously placed stent. He had 30% RCA stenosis and 40% stenosis elsewhere. He was on Plavix at that time and seems to have failed that medication. He did have an ejection fraction of about 45% with anteroapical akinesis.  He has also seen Dr. Gwenlyn Found and Dr. Scot Dock for claudication.  He has had angiography and had aortobifemoral bypass graft with diffuse 70 - 80% proximal, mid and distal left SFA stenosis. He had 2 vessel runoff with an occluded peroneal.  He was not thought to be a candidate for PTA or redo surgery.  ? ?He is not having any new symptoms.  He walks his dog.  With that he does not bring on any chest pain.  He has some fleeting 5 seconds of chest discomfort periodically.  However, he has not had anything that lasts or takes a nitroglycerin.  He does not have any new shortness of breath, PND or orthopnea.  Has had no new palpitations, presyncope or syncope.  He has had occasional cramping in his neck. ? ?Past Medical History:  ?Diagnosis Date  ? CAD (coronary artery disease) 12/2008  ? a. cath 11/2011 s/p DES to LAD b. anterior STEMI 09/04/2014 cath DES to occluded prox LAD, 40% AV groove LCx and 30% mid RCA  ? Cancer [facility]) 06/2012  ? prostate, larynx  ? CHF (congestive heart failure) ([place])   ? COPD (chronic obstructive pulmonary disease) ([place])   ? CVA (cerebral infarction) 06/2009  ? Elevated PSA 02/2012  ? 6.54  ? Hyperlipidemia   ? intolerant to statins  ? Myocardial infarction [facility])   ? PVD (peripheral  vascular disease) ([place])   ? Aortobifemoral Bypass 2010, Thrombectomy and repair right brachial artery   ? Radiation 08/10/2012-09/22/2012  ? 6525 cGy to larynx  ? Stroke [facility])   ? Tobacco abuse   ? Tubular adenoma   ? Unstable angina (HCC)   ? ? ?Past Surgical History:  ?Procedure Laterality Date  ? Aortobifemoral bypass  2010  ? COLONOSCOPY W/ BIOPSIES    ? CORONARY ANGIOPLASTY WITH STENT PLACEMENT  11/2011  ? larynx biopsy  07/12/12  ? LEFT HEART CATHETERIZATION WITH CORONARY ANGIOGRAM N/A 12/17/2011  ? Procedure: LEFT HEART CATHETERIZATION WITH CORONARY ANGIOGRAM;  Surgeon: Hillary Bow, MD;  Location: [facility] CATH LAB;  Service: Cardiovascular;  Laterality: N/A;  ? LEFT HEART CATHETERIZATION WITH CORONARY ANGIOGRAM N/A 09/04/2014  ? Procedure: LEFT HEART CATHETERIZATION WITH CORONARY ANGIOGRAM;  Surgeon: Troy Sine, MD;  Location: [facility] CATH LAB;  Service: Cardiovascular;  Laterality: N/A;  ? LOWER EXTREMITY ANGIOGRAPHY Left 09/14/2016  ? Procedure: Lower Extremity Angiography;  Surgeon: Lorretta Harp, MD;  Location: [place] CV LAB;  Service: Cardiovascular;  Laterality: Left;  ? PERCUTANEOUS CORONARY STENT INTERVENTION (PCI-S) N/A 12/17/2011  ? Procedure: PERCUTANEOUS CORONARY STENT INTERVENTION (PCI-S);  Surgeon: Hillary Bow, MD;  Location: [facility] CATH LAB;  Service: Cardiovascular;  Laterality: N/A;  ? PROSTATE BIOPSY  07/12/12  ?  Gleason's grade 3+3+6  ? PROSTATECTOMY  12/2015  ? Thrombectomy and brachial artery repair    ? 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 ? ? ?Current Outpatient Medications  ?Medication Sig Dispense Refill  ? albuterol (PROAIR HFA) 108 (90 Base) MCG/ACT inhaler Inhale 1-2 puffs into the lungs every 6 (six) hours as needed for wheezing or shortness of breath. 18 g 5  ? aspirin EC 81 MG tablet Take 1 tablet (81 mg total) by mouth daily.    ? BRILINTA 90 MG TABS tablet TAKE (1) TABLET TWICE A DAY. 180 tablet 3  ? ezetimibe (ZETIA) 10 MG tablet Take 1 tablet (10  mg total) by mouth daily. 90 tablet 3  ? fluocinonide cream (LIDEX) 6.29 % Apply 1 application topically 2 (two) times daily as needed. 30 g 5  ? isosorbide mononitrate (IMDUR) 30 MG 24 hr tablet Take 1 tablet (30 mg total) by mouth daily. 90 tablet 3  ? metoprolol tartrate (LOPRESSOR) 25 MG tablet Take 0.5 tablets (12.5 mg total) by mouth 2 (two) times daily. 90 tablet 3  ? nitroGLYCERIN (NITROSTAT) 0.4 MG SL tablet Place 1 tablet (0.4 mg total) under the tongue every 5 (five) minutes as needed for chest pain (up to 3 doses). 25 tablet 5  ? REPATHA SURECLICK 476 MG/ML SOAJ Inject 140 mg as directed every 14 (fourteen) days. 2 mL 11  ? tiotropium (SPIRIVA HANDIHALER) 18 MCG inhalation capsule Place 1 capsule (18 mcg total) into inhaler and inhale daily. Place on file until patient asked for prescription (Patient not taking: Reported on 10/15/2021) 90 capsule 3  ? ?No current facility-administered medications for this visit.  ? ? ?Allergies:   Contrast media [iodinated contrast media], Statins, and Other  ? ? ?ROS:  Please see the history of present illness.   Otherwise, review of systems are positive for occasional headaches.   All other systems are reviewed and negative.  ? ? ?PHYSICAL EXAM: ?VS:  BP 90/68   Pulse 64   Ht '5\' 9"'$  (1.753 m)   Wt 200 lb (90.7 kg)   BMI 29.53 kg/m?  , BMI Body mass index is 29.53 kg/m?. ?GENERAL:  Well appearing ?NECK:  No jugular venous distention, waveform within normal limits, carotid upstroke brisk and symmetric, no bruits, no thyromegaly ?LUNGS:  Clear to auscultation bilaterally ?CHEST:  Unremarkable ?HEART:  PMI not displaced or sustained,S1 and S2 within normal limits, no S3, no S4, no clicks, no rubs, no murmurs ?ABD:  Flat, positive bowel sounds normal in frequency in pitch, no bruits, no rebound, no guarding, no midline pulsatile mass, no hepatomegaly, no splenomegaly ?EXT:  2 plus pulses throughout, no edema, no cyanosis no clubbing ? ? ?EKG:  EKG is  ordered today. ?The  ekg ordered today demonstrates sinus bradycardia, rate 64, leftward axis, RSR prime V1 V2, nonspecific T wave flattening, left axis deviation ? ? ?Recent Labs: ?08/20/2021: ALT 18; BUN 13; Creatinine, Ser 0.97; Hemoglobin 16.1; Platelets 210; Potassium 4.3; Sodium 140  ? ? ?Lipid Panel ?   ?Component Value Date/Time  ? CHOL 122 08/20/2021 1029  ? CHOL 196 12/15/2012 1007  ? TRIG 158 (H) 08/20/2021 1029  ? TRIG 176 (H) 12/15/2012 1007  ? HDL 51 08/20/2021 1029  ? HDL 42 12/15/2012 1007  ? CHOLHDL 2.4 08/20/2021 1029  ? CHOLHDL 5.9 12/18/2011 0505  ? VLDL 53 (H) 12/18/2011 0505  ? LDLCALC 45 08/20/2021 1029  ? LDLCALC 119 (H) 12/15/2012 1007  ? ?  ? ?  Wt Readings from Last 3 Encounters:  ?10/15/21 200 lb (90.7 kg)  ?09/04/21 195 lb (88.5 kg)  ?08/20/21 198 lb (89.8 kg)  ?  ? ? ?Other studies Reviewed: ?Additional studies/ records that were reviewed today include: Labs. ?Review of the above records demonstrates:  Please see elsewhere in the note.   ? ? ?ASSESSMENT AND PLAN: ? ?CAD -  ?The patient has no new sypmtoms.  No further cardiovascular testing is indicated.  We will continue with aggressive risk reduction and meds as listed. ? ?Hyperlipidemia -  ?He is statin intolerant and his LDL on PCSK9 is 45.  No change in therapy.  ?  ?Tobacco abuse -  ?We have talked at great length about stopping smoking.  He tried Chantix and it did not work.  ? ?Ischemic cardiomyopathy - ?His last ejection fraction was 55%.  No change in therapy.  ? ?Neck discomfort - ?We will get carotid Dopplers. ? ?Current medicines are reviewed at length with the patient today.  The patient does not have concerns regarding medicines. ? ?The following changes have been made:  None ? ?Labs/ tests ordered today include:  ? ?Orders Placed This Encounter  ?Procedures  ? US Carotid Bilateral  ? EKG 12-Lead  ? ? ? ?Disposition:   FU with me in one year.   ? ? ?Signed, ?Minus Breeding, MD  ?10/15/2021 10:42 AM    ?[place] ? ? ? ?

## 2021-10-15 ENCOUNTER — Encounter: Payer: Self-pay | Admitting: Cardiology

## 2021-10-15 ENCOUNTER — Ambulatory Visit: Payer: Medicare HMO | Admitting: Cardiology

## 2021-10-15 VITALS — BP 90/68 | HR 64 | Ht 69.0 in | Wt 200.0 lb

## 2021-10-15 DIAGNOSIS — I2510 Atherosclerotic heart disease of native coronary artery without angina pectoris: Secondary | ICD-10-CM | POA: Diagnosis not present

## 2021-10-15 DIAGNOSIS — I255 Ischemic cardiomyopathy: Secondary | ICD-10-CM | POA: Diagnosis not present

## 2021-10-15 DIAGNOSIS — Z720 Tobacco use: Secondary | ICD-10-CM | POA: Diagnosis not present

## 2021-10-15 DIAGNOSIS — R0989 Other specified symptoms and signs involving the circulatory and respiratory systems: Secondary | ICD-10-CM

## 2021-10-15 DIAGNOSIS — E785 Hyperlipidemia, unspecified: Secondary | ICD-10-CM | POA: Diagnosis not present

## 2021-10-15 DIAGNOSIS — I509 Heart failure, unspecified: Secondary | ICD-10-CM | POA: Diagnosis not present

## 2021-10-15 NOTE — Patient Instructions (Signed)
Medication Instructions:  ?The current medical regimen is effective;  continue present plan and medications. ? ?*If you need a refill on your cardiac medications before your next appointment, please call your pharmacy* ? ?Testing/Procedures: ?Your physician has requested that you have a carotid duplex. This test is an ultrasound of the carotid arteries in your neck. It looks at blood flow through these arteries that supply the brain with blood. Allow one hour for this exam. There are no restrictions or special instructions. ? ?This will be completed at [facility] and you will be contacted to be scheduled. ? ? ?Follow-Up: ?At [facility], you and your health needs are our priority.  As part of our continuing mission to provide you with exceptional heart care, we have created designated Provider Care Teams.  These Care Teams include your primary Cardiologist (physician) and Advanced Practice Providers (APPs -  Physician Assistants and Nurse Practitioners) who all work together to provide you with the care you need, when you need it. ? ?We recommend signing up for the patient portal called "MyChart".  Sign up information is provided on this After Visit Summary.  MyChart is used to connect with patients for Virtual Visits (Telemedicine).  Patients are able to view lab/test results, encounter notes, upcoming appointments, etc.  Non-urgent messages can be sent to your provider as well.   ?To learn more about what you can do with MyChart, go to [email or web address].   ? ?Your next appointment:   ?1 year(s) ? ?The format for your next appointment:   ?In Person ? ?Provider:   ?Minus Breeding, MD  ? ? ?Important Information About Sugar ? ? ? ? ?  ?

## 2021-10-23 ENCOUNTER — Ambulatory Visit (HOSPITAL_COMMUNITY)
Admission: RE | Admit: 2021-10-23 | Discharge: 2021-10-23 | Disposition: A | Discharge status: Home / Self Care | Payer: Medicare HMO | Source: Ambulatory Visit | Attending: Cardiology | Admitting: Cardiology

## 2021-10-23 DIAGNOSIS — R0989 Other specified symptoms and signs involving the circulatory and respiratory systems: Secondary | ICD-10-CM | POA: Diagnosis not present

## 2021-10-23 DIAGNOSIS — Z720 Tobacco use: Secondary | ICD-10-CM | POA: Diagnosis not present

## 2021-10-23 DIAGNOSIS — I6523 Occlusion and stenosis of bilateral carotid arteries: Secondary | ICD-10-CM | POA: Diagnosis not present

## 2021-10-23 DIAGNOSIS — E785 Hyperlipidemia, unspecified: Secondary | ICD-10-CM | POA: Insufficient documentation

## 2021-10-30 ENCOUNTER — Encounter: Payer: Self-pay | Admitting: *Deleted

## 2022-02-04 ENCOUNTER — Ambulatory Visit: Payer: Self-pay | Admitting: *Deleted

## 2022-02-04 ENCOUNTER — Encounter: Payer: Self-pay | Admitting: Family Medicine

## 2022-02-04 ENCOUNTER — Ambulatory Visit (INDEPENDENT_AMBULATORY_CARE_PROVIDER_SITE_OTHER): Payer: Medicare HMO

## 2022-02-04 ENCOUNTER — Ambulatory Visit (INDEPENDENT_AMBULATORY_CARE_PROVIDER_SITE_OTHER): Payer: Medicare HMO | Admitting: Family Medicine

## 2022-02-04 VITALS — BP 119/68 | HR 57 | Temp 98.0°F | Ht 69.0 in | Wt 201.0 lb

## 2022-02-04 DIAGNOSIS — M160 Bilateral primary osteoarthritis of hip: Secondary | ICD-10-CM | POA: Diagnosis not present

## 2022-02-04 DIAGNOSIS — M1611 Unilateral primary osteoarthritis, right hip: Secondary | ICD-10-CM

## 2022-02-04 MED ORDER — PREDNISONE 20 MG PO TABS: ORAL_TABLET | ORAL | 0 refills | Status: DC

## 2022-02-04 NOTE — Progress Notes (Signed)
BP 119/68   Pulse (!) 57   Temp 98 F (36.7 C)   Ht '5\' 9"'$  (1.753 m)   Wt 201 lb (91.2 kg)   SpO2 96%   BMI 29.68 kg/m    Subjective:   Patient ID: [name], male    DOB: Nov 20, 1950, 71 y.o.   MRN: [id number]  HPI: [name] is a 71 y.o. male presenting on 02/04/2022 for Groin Pain (Right side- radiates down right leg)   HPI Right hip pain Patient is coming in with right hip pain that radiates down the front of his right leg.  It hurts when he walks or when he stands on it and then when he sits down it is aching.  It was hurting over the past few months but initially was just hurting during the daytime when he was a bit about hurting all the time.  Just seems to be getting worse.  He says the worst of the pain is right in his groin.  Relevant past medical, surgical, family and social history reviewed and updated as indicated. Interim medical history since our last visit reviewed. Allergies and medications reviewed and updated.  Review of Systems  Constitutional:  Negative for chills and fever.  Respiratory:  Negative for shortness of breath and wheezing.   Cardiovascular:  Negative for chest pain and leg swelling.  Musculoskeletal:  Positive for arthralgias and myalgias. Negative for back pain, gait problem, neck pain and neck stiffness.  Skin:  Negative for rash.  All other systems reviewed and are negative.   Per HPI unless specifically indicated above   Allergies as of 02/04/2022       Reactions   Contrast Media [iodinated Contrast Media]    Statins Other (See Comments)   Muscle pain   Other Itching, Swelling, Dermatitis   IV dye        Medication List        Accurate as of February 04, 2022  8:32 AM. If you have any questions, ask your nurse or doctor.          STOP taking these medications    Spiriva HandiHaler 18 MCG inhalation capsule Generic drug: tiotropium Stopped by: Fransisca Kaufmann Vivika Poythress, MD       TAKE these medications    albuterol  108 (90 Base) MCG/ACT inhaler Commonly known as: ProAir HFA Inhale 1-2 puffs into the lungs every 6 (six) hours as needed for wheezing or shortness of breath.   aspirin EC 81 MG tablet Take 1 tablet (81 mg total) by mouth daily.   Brilinta 90 MG Tabs tablet Generic drug: ticagrelor TAKE (1) TABLET TWICE A DAY.   ezetimibe 10 MG tablet Commonly known as: ZETIA Take 1 tablet (10 mg total) by mouth daily.   fluocinonide cream 0.05 % Commonly known as: LIDEX Apply 1 application topically 2 (two) times daily as needed.   isosorbide mononitrate 30 MG 24 hr tablet Commonly known as: IMDUR Take 1 tablet (30 mg total) by mouth daily.   metoprolol tartrate 25 MG tablet Commonly known as: LOPRESSOR Take 0.5 tablets (12.5 mg total) by mouth 2 (two) times daily.   nitroGLYCERIN 0.4 MG SL tablet Commonly known as: NITROSTAT Place 1 tablet (0.4 mg total) under the tongue every 5 (five) minutes as needed for chest pain (up to 3 doses).   predniSONE 20 MG tablet Commonly known as: DELTASONE 2 po at same time daily for 5 days Started by: Worthy Rancher, MD   Repatha  SureClick 672 MG/ML Soaj Generic drug: Evolocumab Inject 140 mg as directed every 14 (fourteen) days.         Objective:   BP 119/68   Pulse (!) 57   Temp 98 F (36.7 C)   Ht '5\' 9"'$  (1.753 m)   Wt 201 lb (91.2 kg)   SpO2 96%   BMI 29.68 kg/m   Wt Readings from Last 3 Encounters:  02/04/22 201 lb (91.2 kg)  10/15/21 200 lb (90.7 kg)  09/04/21 195 lb (88.5 kg)    Physical Exam Vitals and nursing note reviewed.  Constitutional:      Appearance: Normal appearance.  Musculoskeletal:     Right hip: Tenderness present. No bony tenderness or crepitus. Normal range of motion. Normal strength.       Legs:  Neurological:     Mental Status: He is alert.       Right hip x-ray, pending, await final read from radiology  Assessment & Plan:   Problem List Items Addressed This Visit   None Visit Diagnoses      Primary osteoarthritis of right hip    -  Primary   Relevant Medications   predniSONE (DELTASONE) 20 MG tablet   Other Relevant Orders   DG HIP UNILAT W OR W/O PELVIS 2-3 VIEWS RIGHT     Will give short course of steroids and see if we can calm it down, if not may discuss physical therapy or orthopedic referral in the future  Follow up plan: Return if symptoms worsen or fail to improve.  Counseling provided for all of the vaccine components Orders Placed This Encounter  Procedures   DG HIP UNILAT W OR W/O PELVIS 2-3 [place] Fumio Vandam, MD [facility] 02/04/2022, 8:32 AM

## 2022-02-04 NOTE — Patient Instructions (Signed)
[name]  At some point during the past 4 years, I have worked with you through the [facility] (CCM) at [place]. We have not worked together within the past 6 months.   Due to program changes I am removing myself from your care team.   If you are currently active with another CCM Team Member, you will remain active with them unless they reach out to you with additional information.   If you feel that you need services in the future,  please talk with your primary care provider and request a new referral for Care Management or Care Coordination services. This does not affect your status as a patient at [facility].   Thank you for allowing me to participate in your your healthcare journey.  Chong Sicilian, BSN, RN-BC Proofreader Dial: [phone]

## 2022-02-04 NOTE — Chronic Care Management (AMB) (Signed)
  Chronic Care Management   Note  02/04/2022 Name: [name] MRN: [id number] DOB: February 04, 1951   Due to changes in the Chronic Care Management program, I am removing myself as the [place] from the Care Team and closing any RN Care Management Care Plans. The patient has not worked with the Consulting civil engineer within the past 6 months. Patient was not scheduled to be followed by the RN Care Coordination nurse for [facility].   Patient does not have an open Care Plan with another CCM team member. If there are open care plans with other team members, I will forward this case closure encounter to them as notification of my case closure. Patient does not have a current CCM referral placed since 10/27/21. CCM enrollment status changed to "not enrolled".   Patient's PCP can place a new referral if the they needs Care Management or Care Coordination services in the future.  Chong Sicilian, BSN, RN-BC Proofreader Dial: [phone]

## 2022-02-10 ENCOUNTER — Other Ambulatory Visit: Payer: Self-pay | Admitting: Cardiology

## 2022-02-18 ENCOUNTER — Ambulatory Visit (INDEPENDENT_AMBULATORY_CARE_PROVIDER_SITE_OTHER): Payer: Medicare HMO | Admitting: Family Medicine

## 2022-02-18 ENCOUNTER — Encounter: Payer: Self-pay | Admitting: Family Medicine

## 2022-02-18 ENCOUNTER — Other Ambulatory Visit: Payer: Self-pay | Admitting: Cardiology

## 2022-02-18 VITALS — BP 130/76 | HR 54 | Temp 98.2°F | Ht 69.0 in | Wt 204.0 lb

## 2022-02-18 DIAGNOSIS — Z720 Tobacco use: Secondary | ICD-10-CM | POA: Diagnosis not present

## 2022-02-18 DIAGNOSIS — I25119 Atherosclerotic heart disease of native coronary artery with unspecified angina pectoris: Secondary | ICD-10-CM

## 2022-02-18 DIAGNOSIS — I739 Peripheral vascular disease, unspecified: Secondary | ICD-10-CM | POA: Diagnosis not present

## 2022-02-18 DIAGNOSIS — E782 Mixed hyperlipidemia: Secondary | ICD-10-CM | POA: Diagnosis not present

## 2022-02-18 DIAGNOSIS — J439 Emphysema, unspecified: Secondary | ICD-10-CM

## 2022-02-18 DIAGNOSIS — Z1211 Encounter for screening for malignant neoplasm of colon: Secondary | ICD-10-CM | POA: Diagnosis not present

## 2022-02-18 MED ORDER — ALBUTEROL SULFATE HFA 108 (90 BASE) MCG/ACT IN AERS: 1.0000 | INHALATION_SPRAY | Freq: Four times a day (QID) | RESPIRATORY_TRACT | 5 refills | Status: DC | PRN

## 2022-02-18 NOTE — Progress Notes (Signed)
BP 130/76   Pulse (!) 54   Temp 98.2 F (36.8 C)   Ht _0  (1.753 m)   Wt 204 lb (92.5 kg)   SpO2 96%   BMI 30.13 kg/m    Subjective:   Patient ID: [name], male    DOB: 11-25-1950, 71 y.o.   MRN: [id number]  HPI: [name] is a 71 y.o. male presenting on 02/18/2022 for Medical Management of Chronic Issues and Hyperlipidemia   HPI Hyperlipidemia PAD and CAD Patient is coming in for recheck of his hyperlipidemia. The patient is currently taking Repatha and Brilinta and Zetia. They deny any issues with myalgias or history of liver damage from it. They deny any focal numbness or weakness or chest pain.   COPD Patient is coming in for COPD recheck today.  He is currently on albuterol but he says he has not had to use it too much recent.  He has a mild chronic cough but denies any major coughing spells or wheezing spells.  He has 1 nighttime symptoms per week and 1 daytime symptoms per week currently.   Relevant past medical, surgical, family and social history reviewed and updated as indicated. Interim medical history since our last visit reviewed. Allergies and medications reviewed and updated.  Review of Systems  Constitutional:  Negative for chills and fever.  Eyes:  Negative for visual disturbance.  Respiratory:  Positive for cough. Negative for shortness of breath and wheezing.   Cardiovascular:  Negative for chest pain and leg swelling.  Musculoskeletal:  Negative for back pain and gait problem.  Skin:  Negative for rash.  Neurological:  Negative for dizziness, weakness and light-headedness.  All other systems reviewed and are negative.   Per HPI unless specifically indicated above   Allergies as of 02/18/2022       Reactions   Contrast Media [iodinated Contrast Media]    Statins Other (See Comments)   Muscle pain   Other Itching, Swelling, Dermatitis   IV dye        Medication List        Accurate as of February 18, 2022 10:52 AM. If you have  any questions, ask your nurse or doctor.          STOP taking these medications    predniSONE 20 MG tablet Commonly known as: DELTASONE Stopped by: Fransisca Kaufmann Marcellius Montagna, MD       TAKE these medications    albuterol 108 (90 Base) MCG/ACT inhaler Commonly known as: ProAir HFA Inhale 1-2 puffs into the lungs every 6 (six) hours as needed for wheezing or shortness of breath.   aspirin EC 81 MG tablet Take 1 tablet (81 mg total) by mouth daily.   Brilinta 90 MG Tabs tablet Generic drug: ticagrelor TAKE ONE TABLET BY MOUTH TWICE DAILY   ezetimibe 10 MG tablet Commonly known as: ZETIA Take 1 tablet (10 mg total) by mouth daily.   fluocinonide cream 0.05 % Commonly known as: LIDEX Apply 1 application topically 2 (two) times daily as needed.   isosorbide mononitrate 30 MG 24 hr tablet Commonly known as: IMDUR Take 1 tablet (30 mg total) by mouth daily.   metoprolol tartrate 25 MG tablet Commonly known as: LOPRESSOR Take 0.5 tablets (12.5 mg total) by mouth 2 (two) times daily.   nitroGLYCERIN 0.4 MG SL tablet Commonly known as: NITROSTAT Place 1 tablet (0.4 mg total) under the tongue every 5 (five) minutes as needed for chest pain (up to 3 doses).  Repatha SureClick 100 MG/ML Soaj Generic drug: Evolocumab Inject 140 mg as directed every 14 (fourteen) days.         Objective:   BP 130/76   Pulse (!) 54   Temp 98.2 F (36.8 C)   Ht _0  (1.753 m)   Wt 204 lb (92.5 kg)   SpO2 96%   BMI 30.13 kg/m   Wt Readings from Last 3 Encounters:  02/18/22 204 lb (92.5 kg)  02/04/22 201 lb (91.2 kg)  10/15/21 200 lb (90.7 kg)    Physical Exam Vitals and nursing note reviewed.  Constitutional:      General: He is not in acute distress.    Appearance: He is well-developed. He is not diaphoretic.  Eyes:     General: No scleral icterus.    Conjunctiva/sclera: Conjunctivae normal.  Neck:     Thyroid: No thyromegaly.  Cardiovascular:     Rate and Rhythm: Normal  rate and regular rhythm.     Heart sounds: Normal heart sounds. No murmur heard. Pulmonary:     Effort: Pulmonary effort is normal. No respiratory distress.     Breath sounds: Normal breath sounds. No wheezing.  Musculoskeletal:        General: No swelling. Normal range of motion.     Cervical back: Neck supple.  Lymphadenopathy:     Cervical: No cervical adenopathy.  Skin:    General: Skin is warm and dry.     Findings: No rash.  Neurological:     Mental Status: He is alert and oriented to person, place, and time.     Coordination: Coordination normal.  Psychiatric:        Behavior: Behavior normal.       Assessment & Plan:   Problem List Items Addressed This Visit       Cardiovascular and Mediastinum   PAD (peripheral artery disease) ([place])   Coronary artery disease involving native coronary artery of native heart with angina pectoris (HCC)     Respiratory   COPD (chronic obstructive pulmonary disease) (HCC)   Relevant Medications   albuterol (PROAIR HFA) 108 (90 Base) MCG/ACT inhaler     Other   Hyperlipidemia   Relevant Medications   albuterol (PROAIR HFA) 108 (90 Base) MCG/ACT inhaler   Other Relevant Orders   CBC with Differential/Platelet   CMP14+EGFR   Lipid panel   Tobacco abuse   Relevant Medications   albuterol (PROAIR HFA) 108 (90 Base) MCG/ACT inhaler   Other Visit Diagnoses     Colon cancer screening    -  Primary   Relevant Orders   Cologuard       Continue current medicine, seems to be stable, no changes.  Do blood work on the way out. Follow up plan: Return in about 6 months (around 08/21/2022), or if symptoms worsen or fail to improve, for Hyperlipidemia and COPD recheck.  Counseling provided for all of the vaccine components Orders Placed This Encounter  Procedures   CBC with Differential/Platelet   CMP14+EGFR   Lipid panel   Cologuard    Caryl Pina, MD [facility] 02/18/2022, 10:52 AM

## 2022-02-19 ENCOUNTER — Other Ambulatory Visit: Payer: Medicare HMO

## 2022-02-19 DIAGNOSIS — E782 Mixed hyperlipidemia: Secondary | ICD-10-CM | POA: Diagnosis not present

## 2022-02-20 LAB — CBC WITH DIFFERENTIAL/PLATELET
Basophils Absolute: 0.1 10*3/uL (ref 0.0–0.2)
Basos: 1 %
EOS (ABSOLUTE): 0.2 10*3/uL (ref 0.0–0.4)
Eos: 2 %
Hematocrit: 47.4 % (ref 37.5–51.0)
Hemoglobin: 16.1 g/dL (ref 13.0–17.7)
Immature Grans (Abs): 0 10*3/uL (ref 0.0–0.1)
Immature Granulocytes: 1 %
Lymphocytes Absolute: 1.9 10*3/uL (ref 0.7–3.1)
Lymphs: 25 %
MCH: 31.3 pg (ref 26.6–33.0)
MCHC: 34 g/dL (ref 31.5–35.7)
MCV: 92 fL (ref 79–97)
Monocytes Absolute: 0.5 10*3/uL (ref 0.1–0.9)
Monocytes: 6 %
Neutrophils Absolute: 5.1 10*3/uL (ref 1.4–7.0)
Neutrophils: 65 %
Platelets: 186 10*3/uL (ref 150–450)
RBC: 5.15 x10E6/uL (ref 4.14–5.80)
RDW: 13.6 % (ref 11.6–15.4)
WBC: 7.8 10*3/uL (ref 3.4–10.8)

## 2022-02-20 LAB — LIPID PANEL
Chol/HDL Ratio: 2.8 ratio (ref 0.0–5.0)
Cholesterol, Total: 131 mg/dL (ref 100–199)
HDL: 47 mg/dL (ref >39)
LDL Chol Calc (NIH): 51 mg/dL (ref 0–99)
Triglycerides: 207 mg/dL — ABNORMAL HIGH (ref 0–149)
VLDL Cholesterol Cal: 33 mg/dL (ref 5–40)

## 2022-02-20 LAB — CMP14+EGFR
ALT: 13 IU/L (ref 0–44)
AST: 10 IU/L (ref 0–40)
Albumin/Globulin Ratio: 1.9 (ref 1.2–2.2)
Albumin: 4.1 g/dL (ref 3.9–4.9)
Alkaline Phosphatase: 84 IU/L (ref 44–121)
BUN/Creatinine Ratio: 11 (ref 10–24)
BUN: 11 mg/dL (ref 8–27)
Bilirubin Total: 0.5 mg/dL (ref 0.0–1.2)
CO2: 20 mmol/L (ref 20–29)
Calcium: 9.1 mg/dL (ref 8.6–10.2)
Chloride: 107 mmol/L — ABNORMAL HIGH (ref 96–106)
Creatinine, Ser: 1.04 mg/dL (ref 0.76–1.27)
Globulin, Total: 2.2 g/dL (ref 1.5–4.5)
Glucose: 100 mg/dL — ABNORMAL HIGH (ref 70–99)
Potassium: 4.1 mmol/L (ref 3.5–5.2)
Sodium: 143 mmol/L (ref 134–144)
Total Protein: 6.3 g/dL (ref 6.0–8.5)
eGFR: 78 mL/min/{1.73_m2} (ref >59)

## 2022-03-03 ENCOUNTER — Other Ambulatory Visit: Payer: Self-pay | Admitting: Family Medicine

## 2022-03-03 DIAGNOSIS — I25119 Atherosclerotic heart disease of native coronary artery with unspecified angina pectoris: Secondary | ICD-10-CM

## 2022-03-03 DIAGNOSIS — E782 Mixed hyperlipidemia: Secondary | ICD-10-CM

## 2022-03-11 DIAGNOSIS — Z1211 Encounter for screening for malignant neoplasm of colon: Secondary | ICD-10-CM | POA: Diagnosis not present

## 2022-03-12 ENCOUNTER — Ambulatory Visit (INDEPENDENT_AMBULATORY_CARE_PROVIDER_SITE_OTHER): Payer: Medicare HMO | Admitting: Family Medicine

## 2022-03-12 ENCOUNTER — Encounter: Payer: Self-pay | Admitting: Family Medicine

## 2022-03-12 ENCOUNTER — Ambulatory Visit (INDEPENDENT_AMBULATORY_CARE_PROVIDER_SITE_OTHER): Payer: Medicare HMO

## 2022-03-12 VITALS — BP 115/72 | HR 55 | Temp 97.9°F | Ht 69.0 in | Wt 204.0 lb

## 2022-03-12 DIAGNOSIS — R0789 Other chest pain: Secondary | ICD-10-CM

## 2022-03-12 DIAGNOSIS — R310 Gross hematuria: Secondary | ICD-10-CM | POA: Diagnosis not present

## 2022-03-12 DIAGNOSIS — R079 Chest pain, unspecified: Secondary | ICD-10-CM | POA: Diagnosis not present

## 2022-03-12 LAB — URINALYSIS, COMPLETE
Bilirubin, UA: NEGATIVE
Glucose, UA: NEGATIVE
Ketones, UA: NEGATIVE
Leukocytes,UA: NEGATIVE
Nitrite, UA: NEGATIVE
Protein,UA: NEGATIVE
RBC, UA: NEGATIVE
Specific Gravity, UA: 1.02 (ref 1.005–1.030)
Urobilinogen, Ur: 0.2 mg/dL (ref 0.2–1.0)
pH, UA: 5.5 (ref 5.0–7.5)

## 2022-03-12 LAB — MICROSCOPIC EXAMINATION
Bacteria, UA: NONE SEEN
Epithelial Cells (non renal): NONE SEEN /hpf (ref 0–10)
RBC, Urine: NONE SEEN /hpf (ref 0–2)
Renal Epithel, UA: NONE SEEN /hpf
WBC, UA: NONE SEEN /hpf (ref 0–5)

## 2022-03-12 NOTE — Progress Notes (Signed)
BP 115/72   Pulse (!) 55   Temp 97.9 F (36.6 C)   Ht '5\' 9"'$  (1.753 m)   Wt 204 lb (92.5 kg)   SpO2 97%   BMI 30.13 kg/m    Subjective:   Patient ID: [name], male    DOB: [birth year], 71 y.o.   MRN: [id number]  HPI: [name] is a 71 y.o. male presenting on 03/12/2022 for chest wall pain (Left sided. Started last week. Does not feel like heart attack. Denies injury.)   HPI Patient is coming today with complaints of left-sided chest wall pain.  He says that this started about 4 or 5 days ago and he has not seen much improvement.  He says it hurts worse with movement or if he touches it or if he bends over or if he twists or if he lays on it at night.  He has tried some Tylenol and it helps a little bit with it.  He denies any shortness of breath or palpitations or clamminess or sweats.  He says it does not feel like when he had cardiac chest pain in the past.  He says it feels different.  He denies any shortness of breath or wheezing more than his baseline.  He is coughing but does not know if he is coughing any more than his baseline currently.  Patient had 1 day of painful urination with blood in his urine just over a week ago and since then its been better but he is concerned about whether again infection or past kidney stone.  Relevant past medical, surgical, family and social history reviewed and updated as indicated. Interim medical history since our last visit reviewed. Allergies and medications reviewed and updated.  Review of Systems  Constitutional:  Negative for chills and fever.  Respiratory:  Positive for cough and wheezing. Negative for chest tightness and shortness of breath.   Cardiovascular:  Positive for chest pain. Negative for palpitations and leg swelling.  Genitourinary:  Positive for dysuria and hematuria. Negative for decreased urine volume and urgency.  Musculoskeletal:  Negative for back pain and gait problem.  Skin:  Negative for rash.  All  other systems reviewed and are negative.   Per HPI unless specifically indicated above   Allergies as of 03/12/2022       Reactions   Contrast Media [iodinated Contrast Media]    Statins Other (See Comments)   Muscle pain   Other Itching, Swelling, Dermatitis   IV dye        Medication List        Accurate as of March 12, 2022 11:04 AM. If you have any questions, ask your nurse or doctor.          albuterol 108 (90 Base) MCG/ACT inhaler Commonly known as: ProAir HFA Inhale 1-2 puffs into the lungs every 6 (six) hours as needed for wheezing or shortness of breath.   aspirin EC 81 MG tablet Take 1 tablet (81 mg total) by mouth daily.   Brilinta 90 MG Tabs tablet Generic drug: ticagrelor TAKE ONE TABLET BY MOUTH TWICE DAILY   ezetimibe 10 MG tablet Commonly known as: ZETIA Take 1 tablet (10 mg total) by mouth daily.   fluocinonide cream 0.05 % Commonly known as: LIDEX Apply 1 application topically 2 (two) times daily as needed.   isosorbide mononitrate 30 MG 24 hr tablet Commonly known as: IMDUR Take 1 tablet (30 mg total) by mouth daily.   metoprolol tartrate 25 MG  tablet Commonly known as: LOPRESSOR Take 0.5 tablets (12.5 mg total) by mouth 2 (two) times daily.   nitroGLYCERIN 0.4 MG SL tablet Commonly known as: NITROSTAT Place 1 tablet (0.4 mg total) under the tongue every 5 (five) minutes as needed for chest pain (up to 3 doses).   Repatha SureClick 517 MG/ML Soaj Generic drug: Evolocumab Inject 140 mg as directed every 14 (fourteen) days.         Objective:   BP 115/72   Pulse (!) 55   Temp 97.9 F (36.6 C)   Ht '5\' 9"'$  (1.753 m)   Wt 204 lb (92.5 kg)   SpO2 97%   BMI 30.13 kg/m   Wt Readings from Last 3 Encounters:  03/12/22 204 lb (92.5 kg)  02/18/22 204 lb (92.5 kg)  02/04/22 201 lb (91.2 kg)    Physical Exam Vitals and nursing note reviewed.  Constitutional:      General: He is not in acute distress.    Appearance: He is  well-developed. He is not diaphoretic.  Eyes:     General: No scleral icterus.    Conjunctiva/sclera: Conjunctivae normal.  Neck:     Thyroid: No thyromegaly.  Cardiovascular:     Rate and Rhythm: Normal rate and regular rhythm.     Heart sounds: Normal heart sounds. No murmur heard. Pulmonary:     Effort: Pulmonary effort is normal. No respiratory distress.     Breath sounds: Normal breath sounds. No wheezing.  Chest:     Chest wall: Tenderness (Reproducible left lower chest wall tenderness from the sternum to the mid axillary line) present.  Musculoskeletal:        General: Normal range of motion.     Cervical back: Neck supple.  Lymphadenopathy:     Cervical: No cervical adenopathy.  Skin:    General: Skin is warm and dry.     Findings: No rash.  Neurological:     Mental Status: He is alert and oriented to person, place, and time.     Coordination: Coordination normal.  Psychiatric:        Behavior: Behavior normal.     Chest x-ray: No acute cardiopulmonary abnormality, await final read from radiologist  Assessment & Plan:   Problem List Items Addressed This Visit   None Visit Diagnoses     Left-sided chest wall pain    -  Primary   Relevant Orders   DG Chest 2 View   Gross hematuria       Relevant Orders   Urinalysis, Complete       Chest wall pain is likely muscular strain from coughing, recommend muscle rub and Tylenol Follow up plan: Return if symptoms worsen or fail to improve.  Counseling provided for all of the vaccine components Orders Placed This Encounter  Procedures   DG Chest 2 View   Urinalysis, Complete    Caryl Pina, MD [facility] 03/12/2022, 11:04 AM

## 2022-03-18 LAB — COLOGUARD: COLOGUARD: NEGATIVE

## 2022-03-19 ENCOUNTER — Ambulatory Visit: Payer: Medicare HMO | Admitting: Family Medicine

## 2022-04-20 ENCOUNTER — Other Ambulatory Visit: Payer: Self-pay | Admitting: *Deleted

## 2022-04-20 DIAGNOSIS — I25119 Atherosclerotic heart disease of native coronary artery with unspecified angina pectoris: Secondary | ICD-10-CM

## 2022-04-20 DIAGNOSIS — E782 Mixed hyperlipidemia: Secondary | ICD-10-CM

## 2022-04-20 MED ORDER — EZETIMIBE 10 MG PO TABS: 10.0000 mg | ORAL_TABLET | Freq: Every day | ORAL | 1 refills | Status: DC

## 2022-04-20 MED ORDER — METOPROLOL TARTRATE 25 MG PO TABS: 12.5000 mg | ORAL_TABLET | Freq: Two times a day (BID) | ORAL | 1 refills | Status: DC

## 2022-04-20 MED ORDER — ISOSORBIDE MONONITRATE ER 30 MG PO TB24: 30.0000 mg | ORAL_TABLET | Freq: Every day | ORAL | 1 refills | Status: DC

## 2022-05-14 ENCOUNTER — Ambulatory Visit (INDEPENDENT_AMBULATORY_CARE_PROVIDER_SITE_OTHER): Payer: Medicare HMO | Admitting: Pharmacist

## 2022-05-14 DIAGNOSIS — G720 Drug-induced myopathy: Secondary | ICD-10-CM

## 2022-05-14 DIAGNOSIS — E782 Mixed hyperlipidemia: Secondary | ICD-10-CM | POA: Diagnosis not present

## 2022-05-14 NOTE — Progress Notes (Signed)
05/14/2022 Name: [name] MRN: [id number] DOB: [birth year]   S:  [name] is a 71 y.o. male patient referred to lipid clinic by PCP. PMH is significant for:   Past Medical History:  Diagnosis Date   CAD (coronary artery disease) 12/2008   a. cath 11/2011 s/p DES to LAD b. anterior STEMI 09/04/2014 cath DES to occluded prox LAD, 40% AV groove LCx and 30% mid RCA   Cancer ([place]) 06/2012   prostate, larynx   CHF (congestive heart failure) (HCC)    COPD (chronic obstructive pulmonary disease) ([place])    CVA (cerebral infarction) 06/2009   Elevated PSA 02/2012   6.54   Hyperlipidemia    intolerant to statins   Myocardial infarction [facility])    PVD (peripheral vascular disease) ([place])    Aortobifemoral Bypass 2010, Thrombectomy and repair right brachial artery    Radiation 08/10/2012-09/22/2012   6525 cGy to larynx   Stroke ([place])    Tobacco abuse    Tubular adenoma    Unstable angina (HCC)     Current Medications:  Current Outpatient Medications on File Prior to Visit  Medication Sig Dispense Refill   albuterol (PROAIR HFA) 108 (90 Base) MCG/ACT inhaler Inhale 1-2 puffs into the lungs every 6 (six) hours as needed for wheezing or shortness of breath. 18 g 5   aspirin EC 81 MG tablet Take 1 tablet (81 mg total) by mouth daily.     BRILINTA 90 MG TABS tablet TAKE ONE TABLET BY MOUTH TWICE DAILY 180 tablet 3   ezetimibe (ZETIA) 10 MG tablet Take 1 tablet (10 mg total) by mouth daily. 90 tablet 1   fluocinonide cream (LIDEX) 9.14 % Apply 1 application topically 2 (two) times daily as needed. 30 g 5   isosorbide mononitrate (IMDUR) 30 MG 24 hr tablet Take 1 tablet (30 mg total) by mouth daily. 90 tablet 1   metoprolol tartrate (LOPRESSOR) 25 MG tablet Take 0.5 tablets (12.5 mg total) by mouth 2 (two) times daily. 90 tablet 1   nitroGLYCERIN (NITROSTAT) 0.4 MG SL tablet Place 1 tablet (0.4 mg total) under the tongue every 5 (five) minutes as needed for chest pain (up to 3 doses). 25  tablet 5   REPATHA SURECLICK 782 MG/ML SOAJ Inject 140 mg as directed every 14 (fourteen) days. 2 mL 11   No current facility-administered medications on file prior to visit.   Allergies  Allergen Reactions   Contrast Media [Iodinated Contrast Media]    Statins Other (See Comments)    Muscle pain   Other Itching, Swelling and Dermatitis    IV dye    Intolerances: statins  Risk Factors: cardiac history as above, htn, smoking, obesity  LDL goal: <70 -pt at goal; would like to work on triglycerides   Diet: decrease fatty foods, sugary foods, processed foods; decrease soda intake; follow heart healthy/mediterranean diet   Exercise: encouraged as able   Social History: current smoker   Labs: Lipid Panel     Component Value Date/Time   CHOL 131 02/19/2022 0831   CHOL 196 12/15/2012 1007   TRIG 207 (H) 02/19/2022 0831   TRIG 176 (H) 12/15/2012 1007   HDL 47 02/19/2022 0831   HDL 42 12/15/2012 1007   CHOLHDL 2.8 02/19/2022 0831   CHOLHDL 5.9 12/18/2011 0505   VLDL 53 (H) 12/18/2011 0505   LDLCALC 51 02/19/2022 0831   LDLCALC 119 (H) 12/15/2012 1007   LABVLDL 33 02/19/2022 0831  Assessment/Plan:     1. Hyperlipidemia - -LDL at goal <70 (51)--tolerating Zetia and Repatha (continue) -Vascepa is preferred for efficacy, however financial constraints-->OTC fish oil  -Recommended dietary changes-decrease soda intake (having around 16oz/day), decrease cakes/cookies/provessed foods -Heavily concentrate on dietary changes -Recommend smoking cessation--patient states she has tried numerous times--encouraged patient to reach out when he is ready again

## 2022-07-20 ENCOUNTER — Other Ambulatory Visit (HOSPITAL_COMMUNITY): Payer: Self-pay

## 2022-08-11 DIAGNOSIS — J3802 Paralysis of vocal cords and larynx, bilateral: Secondary | ICD-10-CM | POA: Diagnosis not present

## 2022-08-11 DIAGNOSIS — H93A3 Pulsatile tinnitus, bilateral: Secondary | ICD-10-CM | POA: Diagnosis not present

## 2022-08-11 DIAGNOSIS — C320 Malignant neoplasm of glottis: Secondary | ICD-10-CM | POA: Diagnosis not present

## 2022-08-25 DIAGNOSIS — T466X5A Adverse effect of antihyperlipidemic and antiarteriosclerotic drugs, initial encounter: Secondary | ICD-10-CM

## 2022-08-25 NOTE — Progress Notes (Signed)
[facility]) [place] Team Statin Quality Measure Assessment  08/25/2022  Shuayb Birschbach 01-16-1951 [id number]   I am a [facility] clinical pharmacist that reviews patients for statin quality initiatives.     Per review of chart and payor information, patient has a diagnosis of ASCVD. This places patient into the Statin use for Patients with Cardiovascular Disease [facility]) measure for CMS. The patient is not currently filling a statin prescription as a result of myopathy. There is Ezetimibe (Zetia) on file.  Last year, dx code statin myopathy (G72.0 or G72.9) was added to the patient's encounter at [facility] 05/14/2022. Next appointment is on 08/26/2022. If deemed therapeutically appropriate, please consider associating exclusion code (see options below) to remove the patient from this year's measure.  Drug Induced Myopathy G72.0 Myopathy, unspecified G72.9   Thank you for allowing [facility] pharmacy to be a part of this patient's care.   Reed Breech, PharmD Clinical Pharmacist  [place] [phone]

## 2022-08-26 ENCOUNTER — Encounter: Payer: Self-pay | Admitting: Family Medicine

## 2022-08-26 ENCOUNTER — Ambulatory Visit (INDEPENDENT_AMBULATORY_CARE_PROVIDER_SITE_OTHER): Payer: Medicare HMO | Admitting: Family Medicine

## 2022-08-26 VITALS — BP 117/74 | HR 51 | Ht 69.0 in | Wt 207.0 lb

## 2022-08-26 DIAGNOSIS — R221 Localized swelling, mass and lump, neck: Secondary | ICD-10-CM

## 2022-08-26 DIAGNOSIS — H9313 Tinnitus, bilateral: Secondary | ICD-10-CM | POA: Diagnosis not present

## 2022-08-26 DIAGNOSIS — Z85819 Personal history of malignant neoplasm of unspecified site of lip, oral cavity, and pharynx: Secondary | ICD-10-CM | POA: Diagnosis not present

## 2022-08-26 DIAGNOSIS — I25119 Atherosclerotic heart disease of native coronary artery with unspecified angina pectoris: Secondary | ICD-10-CM

## 2022-08-26 DIAGNOSIS — Z125 Encounter for screening for malignant neoplasm of prostate: Secondary | ICD-10-CM | POA: Diagnosis not present

## 2022-08-26 DIAGNOSIS — H93A9 Pulsatile tinnitus, unspecified ear: Secondary | ICD-10-CM | POA: Diagnosis not present

## 2022-08-26 NOTE — Addendum Note (Signed)
Addended by: Alphonzo Dublin on: 08/26/2022 03:55 PM   Modules accepted: Orders

## 2022-08-26 NOTE — Progress Notes (Signed)
BP 117/74   Pulse (!) 51   Ht '5\' 9"'$  (1.753 m)   Wt 207 lb (93.9 kg)   SpO2 97%   BMI 30.57 kg/m    Subjective:   Patient ID: [name], male    DOB: May 17, 1951, 72 y.o.   MRN: [id number]  HPI: [name] is a 72 y.o. male presenting on 08/26/2022 for Tinnitus (Bilateral ears. Started 8mago. Not getting worse.)   HPI Patient comes in today with a brown growth on the left side of his neck that he thinks it popped up over the past couple weeks.  He says it is not painful and firm has increased in size over the past couple weeks on the left side of his neck.  He does have a history of head neck cancer.  He denies any cough or congestion or runny nose or recent illness.  He does have some tinnitus in his ears is been bothering him chronically but that has not changed except for that it is annoying.  Relevant past medical, surgical, family and social history reviewed and updated as indicated. Interim medical history since our last visit reviewed. Allergies and medications reviewed and updated.  Review of Systems  Constitutional:  Negative for chills and fever.  HENT:  Positive for tinnitus.   Eyes:  Negative for visual disturbance.  Respiratory:  Negative for shortness of breath and wheezing.   Cardiovascular:  Negative for chest pain and leg swelling.  Musculoskeletal:  Negative for neck pain and neck stiffness.  Skin:  Negative for rash.  All other systems reviewed and are negative.   Per HPI unless specifically indicated above   Allergies as of 08/26/2022       Reactions   Contrast Media [iodinated Contrast Media]    Statins Other (See Comments)   Muscle pain   Other Itching, Swelling, Dermatitis   IV dye        Medication List        Accurate as of August 26, 2022  3:46 PM. If you have any questions, ask your nurse or doctor.          albuterol 108 (90 Base) MCG/ACT inhaler Commonly known as: ProAir HFA Inhale 1-2 puffs into the lungs every 6  (six) hours as needed for wheezing or shortness of breath.   aspirin EC 81 MG tablet Take 1 tablet (81 mg total) by mouth daily.   Brilinta 90 MG Tabs tablet Generic drug: ticagrelor TAKE ONE TABLET BY MOUTH TWICE DAILY   ezetimibe 10 MG tablet Commonly known as: ZETIA Take 1 tablet (10 mg total) by mouth daily.   Fish Oil 600 MG Caps Take 1 capsule by mouth in the morning and at bedtime.   fluocinonide cream 0.05 % Commonly known as: LIDEX Apply 1 application topically 2 (two) times daily as needed.   isosorbide mononitrate 30 MG 24 hr tablet Commonly known as: IMDUR Take 1 tablet (30 mg total) by mouth daily.   metoprolol tartrate 25 MG tablet Commonly known as: LOPRESSOR Take 0.5 tablets (12.5 mg total) by mouth 2 (two) times daily.   nitroGLYCERIN 0.4 MG SL tablet Commonly known as: NITROSTAT Place 1 tablet (0.4 mg total) under the tongue every 5 (five) minutes as needed for chest pain (up to 3 doses).   Repatha SureClick [id number]/ML Soaj Generic drug: Evolocumab Inject 140 mg as directed every 14 (fourteen) days.         Objective:   BP 117/74  Pulse (!) 51   Ht '5\' 9"'$  (1.753 m)   Wt 207 lb (93.9 kg)   SpO2 97%   BMI 30.57 kg/m   Wt Readings from Last 3 Encounters:  08/26/22 207 lb (93.9 kg)  03/12/22 204 lb (92.5 kg)  02/18/22 204 lb (92.5 kg)    Physical Exam Vitals and nursing note reviewed.  Constitutional:      General: He is not in acute distress.    Appearance: He is well-developed. He is not diaphoretic.  HENT:     Right Ear: Tympanic membrane and ear canal normal.     Left Ear: Tympanic membrane and ear canal normal.  Eyes:     General: No scleral icterus.    Conjunctiva/sclera: Conjunctivae normal.  Neck:     Thyroid: No thyromegaly.   Cardiovascular:     Rate and Rhythm: Normal rate and regular rhythm.     Heart sounds: Normal heart sounds. No murmur heard. Pulmonary:     Effort: Pulmonary effort is normal. No respiratory  distress.     Breath sounds: Normal breath sounds. No wheezing.  Musculoskeletal:        General: Normal range of motion.     Cervical back: Neck supple.  Lymphadenopathy:     Cervical: Cervical adenopathy present.  Skin:    General: Skin is warm and dry.     Findings: No rash.  Neurological:     Mental Status: He is alert and oriented to person, place, and time.     Coordination: Coordination normal.  Psychiatric:        Behavior: Behavior normal.       Assessment & Plan:   Problem List Items Addressed This Visit   None Visit Diagnoses     Mass of left side of neck    -  Primary   Relevant Orders   MR NECK SOFT TISSUE ONLY W WO CONTRAST   History of throat cancer       Relevant Orders   MR NECK SOFT TISSUE ONLY W WO CONTRAST   Tinnitus of both ears       Pulsatile tinnitus         Will get MRI neck to look at nodule.  Follow up plan: Return if symptoms worsen or fail to improve.  Counseling provided for all of the vaccine components Orders Placed This Encounter  Procedures   MR NECK SOFT TISSUE ONLY W [place] Giovanny Dugal, MD [facility] 08/26/2022, 3:46 PM

## 2022-08-27 LAB — CMP14+EGFR
ALT: 13 IU/L (ref 0–44)
AST: 15 IU/L (ref 0–40)
Albumin/Globulin Ratio: 1.6 (ref 1.2–2.2)
Albumin: 4.1 g/dL (ref 3.9–4.9)
Alkaline Phosphatase: 93 IU/L (ref 44–121)
BUN/Creatinine Ratio: 13 (ref 10–24)
BUN: 13 mg/dL (ref 8–27)
Bilirubin Total: 0.4 mg/dL (ref 0.0–1.2)
CO2: 24 mmol/L (ref 20–29)
Calcium: 9.4 mg/dL (ref 8.6–10.2)
Chloride: 104 mmol/L (ref 96–106)
Creatinine, Ser: 1.01 mg/dL (ref 0.76–1.27)
Globulin, Total: 2.5 g/dL (ref 1.5–4.5)
Glucose: 72 mg/dL (ref 70–99)
Potassium: 4.1 mmol/L (ref 3.5–5.2)
Sodium: 144 mmol/L (ref 134–144)
Total Protein: 6.6 g/dL (ref 6.0–8.5)
eGFR: 80 mL/min/{1.73_m2} (ref >59)

## 2022-08-27 LAB — CBC WITH DIFFERENTIAL/PLATELET
Basophils Absolute: 0.1 10*3/uL (ref 0.0–0.2)
Basos: 1 %
EOS (ABSOLUTE): 0.1 10*3/uL (ref 0.0–0.4)
Eos: 2 %
Hematocrit: 47.4 % (ref 37.5–51.0)
Hemoglobin: 16.1 g/dL (ref 13.0–17.7)
Immature Grans (Abs): 0 10*3/uL (ref 0.0–0.1)
Immature Granulocytes: 0 %
Lymphocytes Absolute: 1.9 10*3/uL (ref 0.7–3.1)
Lymphs: 24 %
MCH: 31.2 pg (ref 26.6–33.0)
MCHC: 34 g/dL (ref 31.5–35.7)
MCV: 92 fL (ref 79–97)
Monocytes Absolute: 0.7 10*3/uL (ref 0.1–0.9)
Monocytes: 8 %
Neutrophils Absolute: 5.1 10*3/uL (ref 1.4–7.0)
Neutrophils: 65 %
Platelets: 221 10*3/uL (ref 150–450)
RBC: 5.16 x10E6/uL (ref 4.14–5.80)
RDW: 13 % (ref 11.6–15.4)
WBC: 7.9 10*3/uL (ref 3.4–10.8)

## 2022-08-27 LAB — LIPID PANEL
Chol/HDL Ratio: 3 ratio (ref 0.0–5.0)
Cholesterol, Total: 115 mg/dL (ref 100–199)
HDL: 38 mg/dL — ABNORMAL LOW (ref >39)
LDL Chol Calc (NIH): 30 mg/dL (ref 0–99)
Triglycerides: 318 mg/dL — ABNORMAL HIGH (ref 0–149)
VLDL Cholesterol Cal: 47 mg/dL — ABNORMAL HIGH (ref 5–40)

## 2022-08-27 LAB — PSA, TOTAL AND FREE
PSA, Free: <0.02 ng/mL
Prostate Specific Ag, Serum: <0.1 ng/mL (ref 0.0–4.0)

## 2022-08-28 ENCOUNTER — Other Ambulatory Visit: Payer: Self-pay | Admitting: Family Medicine

## 2022-08-28 ENCOUNTER — Ambulatory Visit (HOSPITAL_COMMUNITY)
Admission: RE | Admit: 2022-08-28 | Discharge: 2022-08-28 | Disposition: A | Discharge status: Home / Self Care | Payer: Medicare HMO | Source: Ambulatory Visit | Attending: Family Medicine | Admitting: Family Medicine

## 2022-08-28 DIAGNOSIS — C799 Secondary malignant neoplasm of unspecified site: Secondary | ICD-10-CM

## 2022-08-28 DIAGNOSIS — R221 Localized swelling, mass and lump, neck: Secondary | ICD-10-CM

## 2022-08-28 DIAGNOSIS — Z85819 Personal history of malignant neoplasm of unspecified site of lip, oral cavity, and pharynx: Secondary | ICD-10-CM | POA: Diagnosis not present

## 2022-08-28 MED ORDER — GADOBUTROL 1 MMOL/ML IV SOLN
10.0000 mL | Freq: Once | INTRAVENOUS | Status: AC | PRN
  Administered 2022-08-28: 10 mL via INTRAVENOUS

## 2022-08-28 NOTE — Progress Notes (Unsigned)
Placed urgent referral to oncology, discussed results with patient in person and he understands and will ask questions as they arise.

## 2022-09-02 DIAGNOSIS — R590 Localized enlarged lymph nodes: Secondary | ICD-10-CM | POA: Diagnosis not present

## 2022-09-02 DIAGNOSIS — C329 Malignant neoplasm of larynx, unspecified: Secondary | ICD-10-CM | POA: Diagnosis not present

## 2022-09-02 DIAGNOSIS — C7951 Secondary malignant neoplasm of bone: Secondary | ICD-10-CM | POA: Diagnosis not present

## 2022-09-02 DIAGNOSIS — C770 Secondary and unspecified malignant neoplasm of lymph nodes of head, face and neck: Secondary | ICD-10-CM | POA: Diagnosis not present

## 2022-09-02 DIAGNOSIS — C61 Malignant neoplasm of prostate: Secondary | ICD-10-CM | POA: Diagnosis not present

## 2022-09-02 DIAGNOSIS — C801 Malignant (primary) neoplasm, unspecified: Secondary | ICD-10-CM | POA: Diagnosis not present

## 2022-09-03 DIAGNOSIS — R221 Localized swelling, mass and lump, neck: Secondary | ICD-10-CM | POA: Diagnosis not present

## 2022-09-03 DIAGNOSIS — C760 Malignant neoplasm of head, face and neck: Secondary | ICD-10-CM | POA: Diagnosis not present

## 2022-09-07 DIAGNOSIS — C7951 Secondary malignant neoplasm of bone: Secondary | ICD-10-CM | POA: Diagnosis not present

## 2022-09-07 DIAGNOSIS — K4090 Unilateral inguinal hernia, without obstruction or gangrene, not specified as recurrent: Secondary | ICD-10-CM | POA: Diagnosis not present

## 2022-09-07 DIAGNOSIS — J439 Emphysema, unspecified: Secondary | ICD-10-CM | POA: Diagnosis not present

## 2022-09-07 DIAGNOSIS — C61 Malignant neoplasm of prostate: Secondary | ICD-10-CM | POA: Diagnosis not present

## 2022-09-07 DIAGNOSIS — Z95828 Presence of other vascular implants and grafts: Secondary | ICD-10-CM | POA: Diagnosis not present

## 2022-09-07 DIAGNOSIS — C329 Malignant neoplasm of larynx, unspecified: Secondary | ICD-10-CM | POA: Diagnosis not present

## 2022-09-07 DIAGNOSIS — C770 Secondary and unspecified malignant neoplasm of lymph nodes of head, face and neck: Secondary | ICD-10-CM | POA: Diagnosis not present

## 2022-09-07 DIAGNOSIS — I700 Atherosclerosis of aorta: Secondary | ICD-10-CM | POA: Diagnosis not present

## 2022-09-10 ENCOUNTER — Ambulatory Visit (INDEPENDENT_AMBULATORY_CARE_PROVIDER_SITE_OTHER): Payer: Medicare HMO | Admitting: Family Medicine

## 2022-09-10 ENCOUNTER — Encounter: Payer: Self-pay | Admitting: Family Medicine

## 2022-09-10 VITALS — BP 107/70 | HR 64 | Ht 69.0 in | Wt 207.0 lb

## 2022-09-10 DIAGNOSIS — I25119 Atherosclerotic heart disease of native coronary artery with unspecified angina pectoris: Secondary | ICD-10-CM | POA: Diagnosis not present

## 2022-09-10 DIAGNOSIS — J439 Emphysema, unspecified: Secondary | ICD-10-CM

## 2022-09-10 DIAGNOSIS — J449 Chronic obstructive pulmonary disease, unspecified: Secondary | ICD-10-CM | POA: Diagnosis not present

## 2022-09-10 DIAGNOSIS — E782 Mixed hyperlipidemia: Secondary | ICD-10-CM | POA: Diagnosis not present

## 2022-09-10 DIAGNOSIS — I739 Peripheral vascular disease, unspecified: Secondary | ICD-10-CM | POA: Diagnosis not present

## 2022-09-10 DIAGNOSIS — I509 Heart failure, unspecified: Secondary | ICD-10-CM | POA: Diagnosis not present

## 2022-09-10 MED ORDER — METOPROLOL TARTRATE 25 MG PO TABS: 12.5000 mg | ORAL_TABLET | Freq: Two times a day (BID) | ORAL | 1 refills | Status: DC

## 2022-09-10 MED ORDER — EZETIMIBE 10 MG PO TABS: 10.0000 mg | ORAL_TABLET | Freq: Every day | ORAL | 1 refills | Status: DC

## 2022-09-10 MED ORDER — ISOSORBIDE MONONITRATE ER 30 MG PO TB24: 30.0000 mg | ORAL_TABLET | Freq: Every day | ORAL | 1 refills | Status: DC

## 2022-09-10 MED ORDER — METHYLPREDNISOLONE ACETATE 80 MG/ML IJ SUSP
80.0000 mg | Freq: Once | INTRAMUSCULAR | Status: AC
  Administered 2022-09-10: 80 mg via INTRA_ARTICULAR

## 2022-09-10 NOTE — Progress Notes (Signed)
BP 107/70   Pulse 64   Ht '5\' 9"'$  (1.753 m)   Wt 207 lb (93.9 kg)   SpO2 96%   BMI 30.57 kg/m    Subjective:   Patient ID: [name], male    DOB: [birth year], 72 y.o.   MRN: [id number]  HPI: Kepler Gianino is a 72 y.o. male presenting on 09/10/2022 for Medical Management of Chronic Issues and Hyperlipidemia   HPI Hyperlipidemia and PAD and CAD, sees cardiology Patient is coming in for recheck of his hyperlipidemia. The patient is currently taking Brilinta and Zetia and Repatha and fish oil. They deny any issues with myalgias or history of liver damage from it. They deny any focal numbness or weakness or chest pain.   COPD Patient is coming in for COPD recheck today.  He is currently on albuterol.  He has a mild chronic cough but denies any major coughing spells or wheezing spells.  He has 0 nighttime symptoms per week and  0 daytime symptoms per week currently.   Patient is still having chest wall pain, he recently had a PET scan that shows multiple lytic lesions throughout his ribs and spine.  Think it is somewhat related to that.  Relevant past medical, surgical, family and social history reviewed and updated as indicated. Interim medical history since our last visit reviewed. Allergies and medications reviewed and updated.  Review of Systems  Constitutional:  Negative for chills and fever.  Respiratory:  Negative for shortness of breath and wheezing.   Cardiovascular:  Negative for chest pain and leg swelling.  Musculoskeletal:  Positive for arthralgias and myalgias. Negative for back pain and gait problem.  Skin:  Negative for rash.  All other systems reviewed and are negative.   Per HPI unless specifically indicated above   Allergies as of 09/10/2022       Reactions   Contrast Media [iodinated Contrast Media]    Statins Other (See Comments)   Muscle pain   Other Itching, Swelling, Dermatitis   IV dye        Medication List        Accurate as of September 10, 2022 11:51 AM. If you have any questions, ask your nurse or doctor.          albuterol 108 (90 Base) MCG/ACT inhaler Commonly known as: ProAir HFA Inhale 1-2 puffs into the lungs every 6 (six) hours as needed for wheezing or shortness of breath.   aspirin EC 81 MG tablet Take 1 tablet (81 mg total) by mouth daily.   Brilinta 90 MG Tabs tablet Generic drug: ticagrelor TAKE ONE TABLET BY MOUTH TWICE DAILY   ezetimibe 10 MG tablet Commonly known as: ZETIA Take 1 tablet (10 mg total) by mouth daily.   Fish Oil 600 MG Caps Take 1 capsule by mouth in the morning and at bedtime.   fluocinonide cream 0.05 % Commonly known as: LIDEX Apply 1 application topically 2 (two) times daily as needed.   isosorbide mononitrate 30 MG 24 hr tablet Commonly known as: IMDUR Take 1 tablet (30 mg total) by mouth daily.   metoprolol tartrate 25 MG tablet Commonly known as: LOPRESSOR Take 0.5 tablets (12.5 mg total) by mouth 2 (two) times daily.   nitroGLYCERIN 0.4 MG SL tablet Commonly known as: NITROSTAT Place 1 tablet (0.4 mg total) under the tongue every 5 (five) minutes as needed for chest pain (up to 3 doses).   Repatha SureClick [id number] MG/ML Soaj Generic drug: Evolocumab Inject  140 mg as directed every 14 (fourteen) days.         Objective:   BP 107/70   Pulse 64   Ht '5\' 9"'$  (1.753 m)   Wt 207 lb (93.9 kg)   SpO2 96%   BMI 30.57 kg/m   Wt Readings from Last 3 Encounters:  09/10/22 207 lb (93.9 kg)  08/26/22 207 lb (93.9 kg)  03/12/22 204 lb (92.5 kg)    Physical Exam Vitals and nursing note reviewed.  Constitutional:      General: He is not in acute distress.    Appearance: He is well-developed. He is not diaphoretic.  Eyes:     General: No scleral icterus.    Conjunctiva/sclera: Conjunctivae normal.  Neck:     Thyroid: No thyromegaly.  Cardiovascular:     Rate and Rhythm: Normal rate and regular rhythm.     Heart sounds: Normal heart sounds. No murmur  heard. Pulmonary:     Effort: Pulmonary effort is normal. No respiratory distress.     Breath sounds: Normal breath sounds. No wheezing.  Chest:     Chest wall: Tenderness present. No deformity or crepitus.    Musculoskeletal:        General: Normal range of motion.     Cervical back: Neck supple.  Lymphadenopathy:     Cervical: No cervical adenopathy.  Skin:    General: Skin is warm and dry.     Findings: No rash.  Neurological:     Mental Status: He is alert and oriented to person, place, and time.     Coordination: Coordination normal.  Psychiatric:        Behavior: Behavior normal.     Results for orders placed or performed in visit on 08/26/22  CBC with Differential/Platelet  Result Value Ref Range   WBC 7.9 3.4 - 10.8 x10E3/uL   RBC 5.16 4.14 - 5.80 x10E6/uL   Hemoglobin 16.1 13.0 - 17.7 g/dL   Hematocrit 47.4 37.5 - 51.0 %   MCV 92 79 - 97 fL   MCH 31.2 26.6 - 33.0 pg   MCHC 34.0 31.5 - 35.7 g/dL   RDW 13.0 11.6 - 15.4 %   Platelets 221 150 - 450 x10E3/uL   Neutrophils 65 Not Estab. %   Lymphs 24 Not Estab. %   Monocytes 8 Not Estab. %   Eos 2 Not Estab. %   Basos 1 Not Estab. %   Neutrophils Absolute 5.1 1.4 - 7.0 x10E3/uL   Lymphocytes Absolute 1.9 0.7 - 3.1 x10E3/uL   Monocytes Absolute 0.7 0.1 - 0.9 x10E3/uL   EOS (ABSOLUTE) 0.1 0.0 - 0.4 x10E3/uL   Basophils Absolute 0.1 0.0 - 0.2 x10E3/uL   Immature Granulocytes 0 Not Estab. %   Immature Grans (Abs) 0.0 0.0 - 0.1 x10E3/uL  CMP14+EGFR  Result Value Ref Range   Glucose 72 70 - 99 mg/dL   BUN 13 8 - 27 mg/dL   Creatinine, Ser 1.01 0.76 - 1.27 mg/dL   eGFR 80 >59 mL/min/1.73   BUN/Creatinine Ratio 13 10 - 24   Sodium 144 134 - 144 mmol/L   Potassium 4.1 3.5 - 5.2 mmol/L   Chloride 104 96 - 106 mmol/L   CO2 24 20 - 29 mmol/L   Calcium 9.4 8.6 - 10.2 mg/dL   Total Protein 6.6 6.0 - 8.5 g/dL   Albumin 4.1 3.9 - 4.9 g/dL   Globulin, Total 2.5 1.5 - 4.5 g/dL   Albumin/Globulin Ratio 1.6 1.2 -  2.2    Bilirubin Total 0.4 0.0 - 1.2 mg/dL   Alkaline Phosphatase 93 44 - 121 IU/L   AST 15 0 - 40 IU/L   ALT 13 0 - 44 IU/L  Lipid panel  Result Value Ref Range   Cholesterol, Total 115 100 - 199 mg/dL   Triglycerides 318 (H) 0 - 149 mg/dL   HDL 38 (L) >39 mg/dL   VLDL Cholesterol Cal 47 (H) 5 - 40 mg/dL   LDL Chol Calc (NIH) 30 0 - 99 mg/dL   Chol/HDL Ratio 3.0 0.0 - 5.0 ratio  PSA, total and free  Result Value Ref Range   Prostate Specific Ag, Serum <0.1 0.0 - 4.0 ng/mL   PSA, Free <0.02 N/A ng/mL   PSA, Free Pct CANCELED %    Assessment & Plan:   Problem List Items Addressed This Visit       Cardiovascular and Mediastinum   PAD (peripheral artery disease) (HCC)   Relevant Medications   ezetimibe (ZETIA) 10 MG tablet   isosorbide mononitrate (IMDUR) 30 MG 24 hr tablet   metoprolol tartrate (LOPRESSOR) 25 MG tablet   Coronary artery disease involving native coronary artery of native heart with angina pectoris (HCC)   Relevant Medications   ezetimibe (ZETIA) 10 MG tablet   isosorbide mononitrate (IMDUR) 30 MG 24 hr tablet   metoprolol tartrate (LOPRESSOR) 25 MG tablet     Respiratory   COPD (chronic obstructive pulmonary disease) (HCC)   Relevant Medications   methylPREDNISolone acetate (DEPO-MEDROL) injection 80 mg (Start on 09/10/2022 12:00 PM)     Other   Hyperlipidemia - Primary   Relevant Medications   ezetimibe (ZETIA) 10 MG tablet   isosorbide mononitrate (IMDUR) 30 MG 24 hr tablet   metoprolol tartrate (LOPRESSOR) 25 MG tablet    Continue current medicine, will give him a steroid injection to help with both the pain and his breathing.  Recommended to use his albuterol inhaler, he has not been using the albuterol at all Follow up plan: Return in about 6 months (around 03/13/2023), or if symptoms worsen or fail to improve, for COPD and hyperlipidemia recheck.  Counseling provided for all of the vaccine components No orders of the defined types were placed in this  encounter.   Caryl Pina, MD [facility] 09/10/2022, 11:51 AM

## 2022-09-16 DIAGNOSIS — C329 Malignant neoplasm of larynx, unspecified: Secondary | ICD-10-CM | POA: Diagnosis not present

## 2022-09-17 DIAGNOSIS — C770 Secondary and unspecified malignant neoplasm of lymph nodes of head, face and neck: Secondary | ICD-10-CM | POA: Diagnosis not present

## 2022-09-17 DIAGNOSIS — C01 Malignant neoplasm of base of tongue: Secondary | ICD-10-CM | POA: Diagnosis not present

## 2022-09-17 DIAGNOSIS — F17210 Nicotine dependence, cigarettes, uncomplicated: Secondary | ICD-10-CM | POA: Diagnosis not present

## 2022-09-17 DIAGNOSIS — I2510 Atherosclerotic heart disease of native coronary artery without angina pectoris: Secondary | ICD-10-CM | POA: Diagnosis not present

## 2022-09-17 DIAGNOSIS — R221 Localized swelling, mass and lump, neck: Secondary | ICD-10-CM | POA: Diagnosis not present

## 2022-09-17 DIAGNOSIS — C7989 Secondary malignant neoplasm of other specified sites: Secondary | ICD-10-CM | POA: Diagnosis not present

## 2022-09-17 DIAGNOSIS — E785 Hyperlipidemia, unspecified: Secondary | ICD-10-CM | POA: Diagnosis not present

## 2022-09-17 DIAGNOSIS — Z8521 Personal history of malignant neoplasm of larynx: Secondary | ICD-10-CM | POA: Diagnosis not present

## 2022-09-17 DIAGNOSIS — Z8673 Personal history of transient ischemic attack (TIA), and cerebral infarction without residual deficits: Secondary | ICD-10-CM | POA: Diagnosis not present

## 2022-09-17 DIAGNOSIS — J449 Chronic obstructive pulmonary disease, unspecified: Secondary | ICD-10-CM | POA: Diagnosis not present

## 2022-09-17 DIAGNOSIS — Z955 Presence of coronary angioplasty implant and graft: Secondary | ICD-10-CM | POA: Diagnosis not present

## 2022-09-17 DIAGNOSIS — I10 Essential (primary) hypertension: Secondary | ICD-10-CM | POA: Diagnosis not present

## 2022-09-17 DIAGNOSIS — I252 Old myocardial infarction: Secondary | ICD-10-CM | POA: Diagnosis not present

## 2022-09-21 DIAGNOSIS — C770 Secondary and unspecified malignant neoplasm of lymph nodes of head, face and neck: Secondary | ICD-10-CM | POA: Diagnosis not present

## 2022-09-21 DIAGNOSIS — C61 Malignant neoplasm of prostate: Secondary | ICD-10-CM | POA: Diagnosis not present

## 2022-09-21 DIAGNOSIS — C801 Malignant (primary) neoplasm, unspecified: Secondary | ICD-10-CM | POA: Diagnosis not present

## 2022-09-21 DIAGNOSIS — C7951 Secondary malignant neoplasm of bone: Secondary | ICD-10-CM | POA: Diagnosis not present

## 2022-09-21 DIAGNOSIS — C329 Malignant neoplasm of larynx, unspecified: Secondary | ICD-10-CM | POA: Diagnosis not present

## 2022-09-25 DIAGNOSIS — C7951 Secondary malignant neoplasm of bone: Secondary | ICD-10-CM | POA: Diagnosis not present

## 2022-09-25 DIAGNOSIS — C61 Malignant neoplasm of prostate: Secondary | ICD-10-CM | POA: Diagnosis not present

## 2022-09-25 DIAGNOSIS — C801 Malignant (primary) neoplasm, unspecified: Secondary | ICD-10-CM | POA: Diagnosis not present

## 2022-09-25 DIAGNOSIS — C329 Malignant neoplasm of larynx, unspecified: Secondary | ICD-10-CM | POA: Diagnosis not present

## 2022-09-25 DIAGNOSIS — C770 Secondary and unspecified malignant neoplasm of lymph nodes of head, face and neck: Secondary | ICD-10-CM | POA: Diagnosis not present

## 2022-09-28 DIAGNOSIS — C140 Malignant neoplasm of pharynx, unspecified: Secondary | ICD-10-CM | POA: Diagnosis not present

## 2022-09-28 DIAGNOSIS — I10 Essential (primary) hypertension: Secondary | ICD-10-CM | POA: Diagnosis not present

## 2022-09-28 DIAGNOSIS — C770 Secondary and unspecified malignant neoplasm of lymph nodes of head, face and neck: Secondary | ICD-10-CM | POA: Diagnosis not present

## 2022-09-28 DIAGNOSIS — I2510 Atherosclerotic heart disease of native coronary artery without angina pectoris: Secondary | ICD-10-CM | POA: Diagnosis not present

## 2022-09-28 DIAGNOSIS — I69998 Other sequelae following unspecified cerebrovascular disease: Secondary | ICD-10-CM | POA: Diagnosis not present

## 2022-09-28 DIAGNOSIS — C61 Malignant neoplasm of prostate: Secondary | ICD-10-CM | POA: Diagnosis not present

## 2022-09-28 DIAGNOSIS — C7951 Secondary malignant neoplasm of bone: Secondary | ICD-10-CM | POA: Diagnosis not present

## 2022-09-28 DIAGNOSIS — C760 Malignant neoplasm of head, face and neck: Secondary | ICD-10-CM | POA: Diagnosis not present

## 2022-09-28 DIAGNOSIS — C329 Malignant neoplasm of larynx, unspecified: Secondary | ICD-10-CM | POA: Diagnosis not present

## 2022-09-28 DIAGNOSIS — E785 Hyperlipidemia, unspecified: Secondary | ICD-10-CM | POA: Diagnosis not present

## 2022-09-28 DIAGNOSIS — J449 Chronic obstructive pulmonary disease, unspecified: Secondary | ICD-10-CM | POA: Diagnosis not present

## 2022-09-30 ENCOUNTER — Telehealth: Payer: Self-pay | Admitting: Family Medicine

## 2022-09-30 DIAGNOSIS — C329 Malignant neoplasm of larynx, unspecified: Secondary | ICD-10-CM | POA: Diagnosis not present

## 2022-09-30 DIAGNOSIS — C7951 Secondary malignant neoplasm of bone: Secondary | ICD-10-CM | POA: Diagnosis not present

## 2022-09-30 NOTE — Telephone Encounter (Signed)
Called patient to schedule Medicare Annual Wellness Visit (AWV). Left message for patient to call back and schedule Medicare Annual Wellness Visit (AWV).  Last date of AWV: 09/04/2021   Please schedule an appointment at any time with either Laura or Courtney, N[facility]'s.   If any questions, please contact me at [phone].  Thank you,  Stephanie,  AMB Clinical Support [facility] AWV Program Direct Dial ??[phone]    

## 2022-10-01 ENCOUNTER — Telehealth: Payer: Self-pay | Admitting: Family Medicine

## 2022-10-01 DIAGNOSIS — C61 Malignant neoplasm of prostate: Secondary | ICD-10-CM | POA: Diagnosis not present

## 2022-10-01 DIAGNOSIS — C329 Malignant neoplasm of larynx, unspecified: Secondary | ICD-10-CM | POA: Diagnosis not present

## 2022-10-01 NOTE — Telephone Encounter (Signed)
Contacted [name] to schedule their annual wellness visit. Appointment made for 10/07/2022.  Thank you,  Colletta [place],  [facility] Direct Dial ??[phone]

## 2022-10-03 DIAGNOSIS — C61 Malignant neoplasm of prostate: Secondary | ICD-10-CM | POA: Diagnosis not present

## 2022-10-03 DIAGNOSIS — C329 Malignant neoplasm of larynx, unspecified: Secondary | ICD-10-CM | POA: Diagnosis not present

## 2022-10-05 DIAGNOSIS — C329 Malignant neoplasm of larynx, unspecified: Secondary | ICD-10-CM | POA: Diagnosis not present

## 2022-10-05 DIAGNOSIS — C61 Malignant neoplasm of prostate: Secondary | ICD-10-CM | POA: Diagnosis not present

## 2022-10-07 ENCOUNTER — Ambulatory Visit (INDEPENDENT_AMBULATORY_CARE_PROVIDER_SITE_OTHER): Payer: Medicare HMO

## 2022-10-07 VITALS — Ht 69.0 in | Wt 207.0 lb

## 2022-10-07 DIAGNOSIS — Z0000 Encounter for general adult medical examination without abnormal findings: Secondary | ICD-10-CM

## 2022-10-07 NOTE — Progress Notes (Signed)
Subjective:   [name] is a 72 y.o. male who presents for Medicare Annual/Subsequent preventive examination.  I connected with  Janne Napoleon on 10/07/22 by a audio enabled telemedicine application and verified that I am speaking with the correct person using two identifiers.  Patient Location: Home  Provider Location: Home Office  I discussed the limitations of evaluation and management by telemedicine. The patient expressed understanding and agreed to proceed.  Review of Systems     Cardiac Risk Factors include: advanced age (>12men, >32 women);dyslipidemia;hypertension;male gender;smoking/ tobacco exposure     Objective:    Today's Vitals   10/07/22 1110  Weight: 207 lb (93.9 kg)  Height: 5\' 9"  (1.753 m)   Body mass index is 30.57 kg/m.     10/07/2022   11:38 AM 09/04/2021    9:06 AM 09/03/2020    8:13 AM 01/31/2020    7:36 PM 06/02/2018    4:47 PM 05/11/2018   11:34 AM 11/04/2017   11:26 AM  Advanced Directives  Does Patient Have a Medical Advance Directive? No No No No No No No  Would patient like information on creating a medical advance directive? No - Patient declined No - Patient declined No - Patient declined No - Patient declined No - Patient declined  No - Patient declined    Current Medications (verified) Outpatient Encounter Medications as of 10/07/2022  Medication Sig   albuterol (PROAIR HFA) 108 (90 Base) MCG/ACT inhaler Inhale 1-2 puffs into the lungs every 6 (six) hours as needed for wheezing or shortness of breath.   aspirin EC 81 MG tablet Take 1 tablet (81 mg total) by mouth daily.   BRILINTA 90 MG TABS tablet TAKE ONE TABLET BY MOUTH TWICE DAILY   ezetimibe (ZETIA) 10 MG tablet Take 1 tablet (10 mg total) by mouth daily.   fluocinonide cream (LIDEX) 0.05 % Apply 1 application topically 2 (two) times daily as needed.   isosorbide mononitrate (IMDUR) 30 MG 24 hr tablet Take 1 tablet (30 mg total) by mouth daily.   metoprolol tartrate (LOPRESSOR) 25  MG tablet Take 0.5 tablets (12.5 mg total) by mouth 2 (two) times daily.   nitroGLYCERIN (NITROSTAT) 0.4 MG SL tablet Place 1 tablet (0.4 mg total) under the tongue every 5 (five) minutes as needed for chest pain (up to 3 doses).   Omega-3 Fatty Acids (FISH OIL) 600 MG CAPS Take 1 capsule by mouth in the morning and at bedtime.   REPATHA SURECLICK 140 MG/ML SOAJ Inject 140 mg as directed every 14 (fourteen) days.   No facility-administered encounter medications on file as of 10/07/2022.    Allergies (verified) Contrast media [iodinated contrast media], Statins, and Other   History: Past Medical History:  Diagnosis Date   CAD (coronary artery disease) 12/2008   a. cath 11/2011 s/p DES to LAD b. anterior STEMI 09/04/2014 cath DES to occluded prox LAD, 40% AV groove LCx and 30% mid RCA   Cancer 06/2012   prostate, larynx   CHF (congestive heart failure)    COPD (chronic obstructive pulmonary disease)    CVA (cerebral infarction) 06/2009   Elevated PSA 02/2012   6.54   Hyperlipidemia    intolerant to statins   Myocardial infarction    PVD (peripheral vascular disease)    Aortobifemoral Bypass 2010, Thrombectomy and repair right brachial artery    Radiation 08/10/2012-09/22/2012   6525 cGy to larynx   Stroke    Tobacco abuse    Tubular adenoma  Unstable angina    Past Surgical History:  Procedure Laterality Date   Aortobifemoral bypass  2010   COLONOSCOPY W/ BIOPSIES     CORONARY ANGIOPLASTY WITH STENT PLACEMENT  11/2011   larynx biopsy  07/12/12   LEFT HEART CATHETERIZATION WITH CORONARY ANGIOGRAM N/A 12/17/2011   Procedure: LEFT HEART CATHETERIZATION WITH CORONARY ANGIOGRAM;  Surgeon: Herby Abrahamhomas D Stuckey, MD;  [facility];  Service: Cardiovascular;  Laterality: N/A;   LEFT HEART CATHETERIZATION WITH CORONARY ANGIOGRAM N/A 09/04/2014   Procedure: LEFT HEART CATHETERIZATION WITH CORONARY ANGIOGRAM;  Surgeon: Lennette Biharihomas A Kelly, MD;  [facility];  Service: Cardiovascular;   Laterality: N/A;   LOWER EXTREMITY ANGIOGRAPHY Left 09/14/2016   Procedure: Lower Extremity Angiography;  Surgeon: Runell GessJonathan J Berry, MD;  [facility];  Service: Cardiovascular;  Laterality: Left;   PERCUTANEOUS CORONARY STENT INTERVENTION (PCI-S) N/A 12/17/2011   Procedure: PERCUTANEOUS CORONARY STENT INTERVENTION (PCI-S);  Surgeon: Herby Abrahamhomas D Stuckey, MD;  [facility];  Service: Cardiovascular;  Laterality: N/A;   PROSTATE BIOPSY  07/12/12   Gleason's grade 3+3+6   PROSTATECTOMY  12/2015   Thrombectomy and brachial artery repair     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   Family History  Problem Relation Age of Onset   Coronary artery disease Father 557   Heart disease Father    Heart attack Father 5856   Coronary artery disease Brother 1358       died with AMI   Heart disease Brother    Breast cancer Mother    Cancer Sister        uterine   Stroke Maternal Grandmother    Cancer Paternal Grandmother    Healthy Daughter    Healthy Son    Heart attack Brother    Healthy Son    Social History   Socioeconomic History   Marital status: Married    Spouse name: Elnita MaxwellCheryl   Number of children: 3   Years of education: 12   Highest education level: High school graduate  Occupational History   Occupation: truck [employer]    Comment: 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  Tobacco Use   Smoking status: Every Day    Packs/day: 0.75    Years: 51.00    Additional pack years: 0.00    Total pack years: 38.25    Types: Cigarettes    Start date: 10/07/1967   Smokeless tobacco: Never  Vaping Use   Vaping Use: Never used  Substance and Sexual Activity   Alcohol use: No    Alcohol/week: 0.0 standard drinks of alcohol   Drug use: No   Sexual activity: Yes  Other Topics Concern   Not on file  Social History Narrative   Patient lives with his wife Elnita Maxwell([name]) - children and grandchildren also live with him   VA Benefits      Disabled/ Retired now   Surveyor, quantityducation  high school.   Patient right handed   Social Determinants of Health   Financial Resource Strain: Low Risk  (10/07/2022)   Overall Financial Resource Strain (CARDIA)    Difficulty of Paying Living Expenses: Not hard at all  Food Insecurity: No Food Insecurity (10/07/2022)   Hunger Vital Sign    Worried About Running Out of Food in the Last Year: Never true    Ran Out of Food in the Last Year: Never true  Transportation Needs: No Transportation Needs (10/07/2022)   PRAPARE - Transportation    Lack of  Transportation (Medical): No    Lack of Transportation (Non-Medical): No  Physical Activity: Sufficiently Active (10/07/2022)   Exercise Vital Sign    Days of Exercise per Week: 5 days    Minutes of Exercise per Session: 30 min  Stress: No Stress Concern Present (10/07/2022)   [employer] of Occupational Health - Occupational Stress Questionnaire    Feeling of Stress : Not at all  Social Connections: Moderately Isolated (10/07/2022)   Social Connection and Isolation Panel [NHANES]    Frequency of Communication with Friends and Family: Once a week    Frequency of Social Gatherings with Friends and Family: Three times a week    Attends Religious Services: Never    Active Member of Clubs or Organizations: No    Attends Engineer, structural: Never    Marital Status: Married    Tobacco Counseling Ready to quit: Not Answered Counseling given: Not Answered   Clinical Intake:  Pre-visit preparation completed: Yes  Pain : No/denies pain  Diabetes: No  How often do you need to have someone help you when you read instructions, pamphlets, or other written materials from your doctor or pharmacy?: 1 - Never  Diabetic?No   Interpreter Needed?: No  Information entered by :: Kandis Fantasia LPN   Activities of Daily Living    10/07/2022   11:38 AM  In your present state of health, do you have any difficulty performing the following activities:  Hearing? 0  Vision? 0   Difficulty concentrating or making decisions? 0  Walking or climbing stairs? 0  Dressing or bathing? 0  Doing errands, shopping? 0  Preparing Food and eating ? N  Using the Toilet? N  In the past six months, have you accidently leaked urine? N  Do you have problems with loss of bowel control? N  Managing your Medications? N  Managing your Finances? N  Housekeeping or managing your Housekeeping? N    Patient Care Team: Dettinger, Elige Radon, MD as PCP - General (Family Medicine) Rollene Rotunda, MD as PCP - Cardiology (Cardiology) Esaw Dace, MD as Attending Physician (Urology) Elane Fritz, MD as Referring Physician (Otolaryngology) Rollene Rotunda, MD as Consulting Physician (Cardiology) Chuck Hint, MD as Consulting Physician (Vascular Surgery) Jeronimo Greaves, MD as Consulting Physician (Diagnostic Radiology) Towana Badger, MD as Referring Physician (Hematology and Oncology)  Indicate any recent Medical Services you may have received from other than [facility] providers in the past year (date may be approximate).     Assessment:   This is a routine wellness examination for Vishaal.  Hearing/Vision screen Hearing Screening - Comments:: .hear Vision Screening - Comments:: Wears rx glasses - up to date with routine eye exams [facility]    Dietary issues and exercise activities discussed: Current Exercise Habits: Home exercise routine, Type of exercise: walking, Time (Minutes): 30, Frequency (Times/Week): 5, Weekly Exercise (Minutes/Week): 150, Intensity: Mild   Goals Addressed             This Visit's Progress    COMPLETED: Patient Stated       09/03/2020 AWV Goal: Exercise for General Health  Patient will verbalize understanding of the benefits of increased physical activity: Exercising regularly is important. It will improve your overall fitness, flexibility, and endurance. Regular exercise also will improve your overall health. It can  help you control your weight, reduce stress, and improve your bone density. Over the next year, patient will increase physical activity as tolerated with a goal of  at least 150 minutes of moderate physical activity per week.  You can tell that you are exercising at a moderate intensity if your heart starts beating faster and you start breathing faster but can still hold a conversation. Moderate-intensity exercise ideas include: Walking 1 mile (1.6 km) in about 15 minutes Biking Hiking Golfing Dancing Water aerobics Patient will verbalize understanding of everyday activities that increase physical activity by providing examples like the following: Yard work, such as: Insurance underwriter Gardening Washing windows or floors Patient will be able to explain general safety guidelines for exercising:  Before you start a new exercise program, talk with your health care provider. Do not exercise so much that you hurt yourself, feel dizzy, or get very short of breath. Wear comfortable clothes and wear shoes with good support. Drink plenty of water while you exercise to prevent dehydration or heat stroke. Work out until your breathing and your heartbeat get faster.       Depression Screen    10/07/2022   11:37 AM 09/10/2022   11:21 AM 08/26/2022    2:44 PM 03/12/2022   10:42 AM 02/18/2022   10:30 AM 02/04/2022    8:12 AM 09/04/2021    8:23 AM  PHQ 2/9 Scores  PHQ - 2 Score 0 0 0 0 0 0 0  PHQ- 9 Score 0   0 0 0     Fall Risk    10/07/2022   11:34 AM 09/10/2022   11:21 AM 08/26/2022    2:44 PM 03/12/2022   10:42 AM 02/18/2022   10:30 AM  Fall Risk   Falls in the past year? 0 0 0 0 0  Number falls in past yr: 0      Injury with Fall? 0      Risk for fall due to : No Fall Risks      Follow up Falls prevention discussed;Education provided;Falls evaluation completed        FALL RISK PREVENTION PERTAINING TO THE  HOME:  Any stairs in or around the home? No  If so, are there any without handrails? No  Home free of loose throw rugs in walkways, pet beds, electrical cords, etc? Yes  Adequate lighting in your home to reduce risk of falls? Yes   ASSISTIVE DEVICES UTILIZED TO PREVENT FALLS:  Life alert? No  Use of a cane, walker or w/c? No  Grab bars in the bathroom? Yes  Shower chair or bench in shower? No  Elevated toilet seat or a handicapped toilet? Yes   TIMED UP AND GO:  Was the test performed? No . Telephonic visit   Cognitive Function:    11/04/2017   11:28 AM 11/03/2016   10:56 AM  MMSE - Mini Mental State Exam  Orientation to time 5 5  Orientation to Place 5 5  Registration 3 3  Attention/ Calculation 5 5  Recall 3 3  Language- name 2 objects 2 2  Language- repeat 1 1  Language- follow 3 step command 3 3  Language- read & follow direction 1 1  Write a sentence 1 1  Copy design 1 1  Total score 30 30        10/07/2022   11:39 AM 09/03/2020    8:14 AM  6CIT Screen  What Year? 0 points 0 points  What month? 0 points 0 points  What time? 0 points 0 points  Count back  from 20 0 points 0 points  Months in reverse 0 points 0 points  Repeat phrase 0 points 0 points  Total Score 0 points 0 points    Immunizations Immunization History  Administered Date(s) Administered   PNEUMOCOCCAL CONJUGATE-20 08/20/2021   Pneumococcal Conjugate-13 08/16/2020   Tdap 11/03/2016    TDAP status: Up to date  Flu Vaccine status: Up to date  Pneumococcal vaccine status: Up to date  Covid-19 vaccine status: Information provided on how to obtain vaccines.   Qualifies for Shingles Vaccine? Yes   Zostavax completed No   Shingrix Completed?: No.    Education has been provided regarding the importance of this vaccine. Patient has been advised to call insurance company to determine out of pocket expense if they have not yet received this vaccine. Advised may also receive vaccine at local  pharmacy or Health Dept. Verbalized acceptance and understanding.  Screening Tests Health Maintenance  Topic Date Due   Lung Cancer Screening  02/28/2023 (Originally 07/26/2013)   Zoster Vaccines- Shingrix (1 of 2) 12/10/2023 (Originally 05/13/1970)   INFLUENZA VACCINE  01/28/2023   Medicare Annual Wellness (AWV)  10/07/2023   Fecal DNA (Cologuard)  03/11/2025   DTaP/Tdap/Td (2 - Td or Tdap) 11/04/2026   Pneumonia Vaccine 4+ Years old  Completed   Hepatitis C Screening  Completed   HPV VACCINES  Aged Out   COLONOSCOPY (Pts 45-51yrs Insurance coverage will need to be confirmed)  Discontinued   COVID-19 Vaccine  Discontinued    Health Maintenance  There are no preventive care reminders to display for this patient.   Colorectal cancer screening: Type of screening: Cologuard. Completed 03/11/22. Repeat every 3 years  Lung Cancer Screening: (Low Dose CT Chest recommended if Age 74-80 years, 30 pack-year currently smoking OR have quit w/in 15years.) does qualify.   Lung Cancer Screening Referral: patient states completed by oncology  Additional Screening:  Hepatitis C Screening: does qualify; Completed 01/07/18  Vision Screening: Recommended annual ophthalmology exams for early detection of glaucoma and other disorders of the eye. Is the patient up to date with their annual eye exam?  Yes  Who is the provider or what is the name of the office in which the patient attends annual [facility] exams[facility]  If pt is not established with a provider, would they like to be referred to a provider to establish care? No .   Dental Screening: Recommended annual dental exams for proper oral hygiene  Community Resource Referral / Chronic Care Management: CRR required this visit?  No   CCM required this visit?  No      Plan:     I have personally reviewed and noted the following in the patient's chart:   Medical and social history Use of alcohol, tobacco or illicit drugs  Current  medications and supplements including opioid prescriptions. Patient is not currently taking opioid prescriptions. Functional ability and status Nutritional status Physical activity Advanced directives List of other physicians Hospitalizations, surgeries, and ER visits in previous 12 months Vitals Screenings to include cognitive, depression, and falls Referrals and appointments  In addition, I have reviewed and discussed with patient certain preventive protocols, quality metrics, and best practice recommendations. A written personalized care plan for preventive services as well as general preventive health recommendations were provided to patient.     Durwin Nora, [place]   [id number]/78/4696   Due to this being a virtual visit, the after visit summary with patients personalized plan was offered to patient via  mail or my-chart.  per request, patient was mailed a copy of AVS  Nurse Notes: No concerns

## 2022-10-07 NOTE — Patient Instructions (Signed)
[name] , Thank you for taking time to come for your Medicare Wellness Visit. I appreciate your ongoing commitment to your health goals. Please review the following plan we discussed and let me know if I can assist you in the future.   These are the goals we discussed:  Goals      Exercise 3x per week (30 min per time)     Have 3 meals a day        This is a list of the screening recommended for you and due dates:  Health Maintenance  Topic Date Due   Screening for Lung Cancer  02/28/2023*   Zoster (Shingles) Vaccine (1 of 2) 12/10/2023*   Flu Shot  01/28/2023   Medicare Annual Wellness Visit  10/07/2023   Cologuard (Stool DNA test)  03/11/2025   DTaP/Tdap/Td vaccine (2 - Td or Tdap) 11/04/2026   Pneumonia Vaccine  Completed   Hepatitis C Screening: USPSTF Recommendation to screen - Ages 21-79 yo.  Completed   HPV Vaccine  Aged Out   Colon Cancer Screening  Discontinued   COVID-19 Vaccine  Discontinued  *Topic was postponed. The date shown is not the original due date.    Advanced directives: Forms are available if you choose in the future to pursue completion.  This is recommended in order to make sure that your health wishes are honored in the event that you are unable to verbalize them to the provider.    Conditions/risks identified: Aim for 30 minutes of exercise or brisk walking, 6-8 glasses of water, and 5 servings of fruits and vegetables each day.   Next appointment: Follow up in one year for your annual wellness visit.   Preventive Care 35 Years and Older, Male  Preventive care refers to lifestyle choices and visits with your health care provider that can promote health and wellness. What does preventive care include? A yearly physical exam. This is also called an annual well check. Dental exams once or twice a year. Routine eye exams. Ask your health care provider how often you should have your eyes checked. Personal lifestyle choices, including: Daily care of  your teeth and gums. Regular physical activity. Eating a healthy diet. Avoiding tobacco and drug use. Limiting alcohol use. Practicing safe sex. Taking low doses of aspirin every day. Taking vitamin and mineral supplements as recommended by your health care provider. What happens during an annual well check? The services and screenings done by your health care provider during your annual well check will depend on your age, overall health, lifestyle risk factors, and family history of disease. Counseling  Your health care provider may ask you questions about your: Alcohol use. Tobacco use. Drug use. Emotional well-being. Home and relationship well-being. Sexual activity. Eating habits. History of falls. Memory and ability to understand (cognition). Work and work Astronomer. Screening  You may have the following tests or measurements: Height, weight, and BMI. Blood pressure. Lipid and cholesterol levels. These may be checked every 5 years, or more frequently if you are over 68 years old. Skin check. Lung cancer screening. You may have this screening every year starting at age 89 if you have a 30-pack-year history of smoking and currently smoke or have quit within the past 15 years. Fecal occult blood test (FOBT) of the stool. You may have this test every year starting at age 59. Flexible sigmoidoscopy or colonoscopy. You may have a sigmoidoscopy every 5 years or a colonoscopy every 10 years starting at age 106.  Prostate cancer screening. Recommendations will vary depending on your family history and other risks. Hepatitis C blood test. Hepatitis B blood test. Sexually transmitted disease (STD) testing. Diabetes screening. This is done by checking your blood sugar (glucose) after you have not eaten for a while (fasting). You may have this done every 1-3 years. Abdominal aortic aneurysm (AAA) screening. You may need this if you are a current or former smoker. Osteoporosis. You may be  screened starting at age 66 if you are at high risk. Talk with your health care provider about your test results, treatment options, and if necessary, the need for more tests. Vaccines  Your health care provider may recommend certain vaccines, such as: Influenza vaccine. This is recommended every year. Tetanus, diphtheria, and acellular pertussis (Tdap, Td) vaccine. You may need a Td booster every 10 years. Zoster vaccine. You may need this after age 74. Pneumococcal 13-valent conjugate (PCV13) vaccine. One dose is recommended after age 69. Pneumococcal polysaccharide (PPSV23) vaccine. One dose is recommended after age 57. Talk to your health care provider about which screenings and vaccines you need and how often you need them. This information is not intended to replace advice given to you by your health care provider. Make sure you discuss any questions you have with your health care provider. Document Released: 07/12/2015 Document Revised: 03/04/2016 Document Reviewed: 04/16/2015 Elsevier Interactive Patient Education  2017 [place] Prevention in the Home Falls can cause injuries. They can happen to people of all ages. There are many things you can do to make your home safe and to help prevent falls. What can I do on the outside of my home? Regularly fix the edges of walkways and driveways and fix any cracks. Remove anything that might make you trip as you walk through a door, such as a raised step or threshold. Trim any bushes or trees on the path to your home. Use bright outdoor lighting. Clear any walking paths of anything that might make someone trip, such as rocks or tools. Regularly check to see if handrails are loose or broken. Make sure that both sides of any steps have handrails. Any raised decks and porches should have guardrails on the edges. Have any leaves, snow, or ice cleared regularly. Use sand or salt on walking paths during winter. Clean up any spills in  your garage right away. This includes oil or grease spills. What can I do in the bathroom? Use night lights. Install grab bars by the toilet and in the tub and shower. Do not use towel bars as grab bars. Use non-skid mats or decals in the tub or shower. If you need to sit down in the shower, use a plastic, non-slip stool. Keep the floor dry. Clean up any water that spills on the floor as soon as it happens. Remove soap buildup in the tub or shower regularly. Attach bath mats securely with double-sided non-slip rug tape. Do not have throw rugs and other things on the floor that can make you trip. What can I do in the bedroom? Use night lights. Make sure that you have a light by your bed that is easy to reach. Do not use any sheets or blankets that are too big for your bed. They should not hang down onto the floor. Have a firm chair that has side arms. You can use this for support while you get dressed. Do not have throw rugs and other things on the floor that can make you trip.  What can I do in the kitchen? Clean up any spills right away. Avoid walking on wet floors. Keep items that you use a lot in easy-to-reach places. If you need to reach something above you, use a strong step stool that has a grab bar. Keep electrical cords out of the way. Do not use floor polish or wax that makes floors slippery. If you must use wax, use non-skid floor wax. Do not have throw rugs and other things on the floor that can make you trip. What can I do with my stairs? Do not leave any items on the stairs. Make sure that there are handrails on both sides of the stairs and use them. Fix handrails that are broken or loose. Make sure that handrails are as long as the stairways. Check any carpeting to make sure that it is firmly attached to the stairs. Fix any carpet that is loose or worn. Avoid having throw rugs at the top or bottom of the stairs. If you do have throw rugs, attach them to the floor with carpet  tape. Make sure that you have a light switch at the top of the stairs and the bottom of the stairs. If you do not have them, ask someone to add them for you. What else can I do to help prevent falls? Wear shoes that: Do not have high heels. Have rubber bottoms. Are comfortable and fit you well. Are closed at the toe. Do not wear sandals. If you use a stepladder: Make sure that it is fully opened. Do not climb a closed stepladder. Make sure that both sides of the stepladder are locked into place. Ask someone to hold it for you, if possible. Clearly mark and make sure that you can see: Any grab bars or handrails. First and last steps. Where the edge of each step is. Use tools that help you move around (mobility aids) if they are needed. These include: Canes. Walkers. Scooters. Crutches. Turn on the lights when you go into a dark area. Replace any light bulbs as soon as they burn out. Set up your furniture so you have a clear path. Avoid moving your furniture around. If any of your floors are uneven, fix them. If there are any pets around you, be aware of where they are. Review your medicines with your doctor. Some medicines can make you feel dizzy. This can increase your chance of falling. Ask your doctor what other things that you can do to help prevent falls. This information is not intended to replace advice given to you by your health care provider. Make sure you discuss any questions you have with your health care provider. Document Released: 04/11/2009 Document Revised: 11/21/2015 Document Reviewed: 07/20/2014 Elsevier Interactive Patient Education  2017 [employer].

## 2022-10-12 DIAGNOSIS — C7951 Secondary malignant neoplasm of bone: Secondary | ICD-10-CM | POA: Diagnosis not present

## 2022-10-12 DIAGNOSIS — C61 Malignant neoplasm of prostate: Secondary | ICD-10-CM | POA: Diagnosis not present

## 2022-10-12 DIAGNOSIS — Z1159 Encounter for screening for other viral diseases: Secondary | ICD-10-CM | POA: Diagnosis not present

## 2022-10-12 DIAGNOSIS — D8982 Autoimmune lymphoproliferative syndrome [ALPS]: Secondary | ICD-10-CM | POA: Diagnosis not present

## 2022-10-12 DIAGNOSIS — C109 Malignant neoplasm of oropharynx, unspecified: Secondary | ICD-10-CM | POA: Diagnosis not present

## 2022-10-12 DIAGNOSIS — Z9079 Acquired absence of other genital organ(s): Secondary | ICD-10-CM | POA: Diagnosis not present

## 2022-10-12 DIAGNOSIS — Z8546 Personal history of malignant neoplasm of prostate: Secondary | ICD-10-CM | POA: Diagnosis not present

## 2022-10-12 DIAGNOSIS — Z923 Personal history of irradiation: Secondary | ICD-10-CM | POA: Diagnosis not present

## 2022-10-12 DIAGNOSIS — C770 Secondary and unspecified malignant neoplasm of lymph nodes of head, face and neck: Secondary | ICD-10-CM | POA: Diagnosis not present

## 2022-10-12 DIAGNOSIS — Z85818 Personal history of malignant neoplasm of other sites of lip, oral cavity, and pharynx: Secondary | ICD-10-CM | POA: Diagnosis not present

## 2022-10-12 DIAGNOSIS — C329 Malignant neoplasm of larynx, unspecified: Secondary | ICD-10-CM | POA: Diagnosis not present

## 2022-10-12 DIAGNOSIS — C801 Malignant (primary) neoplasm, unspecified: Secondary | ICD-10-CM | POA: Diagnosis not present

## 2022-10-28 DIAGNOSIS — M549 Dorsalgia, unspecified: Secondary | ICD-10-CM | POA: Diagnosis not present

## 2022-10-28 DIAGNOSIS — C7951 Secondary malignant neoplasm of bone: Secondary | ICD-10-CM | POA: Diagnosis not present

## 2022-10-28 DIAGNOSIS — M542 Cervicalgia: Secondary | ICD-10-CM | POA: Diagnosis not present

## 2022-10-30 DIAGNOSIS — C770 Secondary and unspecified malignant neoplasm of lymph nodes of head, face and neck: Secondary | ICD-10-CM | POA: Diagnosis not present

## 2022-10-30 DIAGNOSIS — C01 Malignant neoplasm of base of tongue: Secondary | ICD-10-CM | POA: Diagnosis not present

## 2022-10-30 DIAGNOSIS — C7989 Secondary malignant neoplasm of other specified sites: Secondary | ICD-10-CM | POA: Diagnosis not present

## 2022-11-07 ENCOUNTER — Other Ambulatory Visit: Payer: Self-pay | Admitting: Family Medicine

## 2022-11-07 DIAGNOSIS — I25119 Atherosclerotic heart disease of native coronary artery with unspecified angina pectoris: Secondary | ICD-10-CM

## 2022-11-07 DIAGNOSIS — E782 Mixed hyperlipidemia: Secondary | ICD-10-CM

## 2022-11-15 DIAGNOSIS — R112 Nausea with vomiting, unspecified: Secondary | ICD-10-CM | POA: Diagnosis not present

## 2022-11-15 DIAGNOSIS — R1310 Dysphagia, unspecified: Secondary | ICD-10-CM | POA: Diagnosis not present

## 2022-11-15 DIAGNOSIS — K760 Fatty (change of) liver, not elsewhere classified: Secondary | ICD-10-CM | POA: Diagnosis not present

## 2022-11-15 DIAGNOSIS — N281 Cyst of kidney, acquired: Secondary | ICD-10-CM | POA: Diagnosis not present

## 2022-11-15 DIAGNOSIS — J449 Chronic obstructive pulmonary disease, unspecified: Secondary | ICD-10-CM | POA: Diagnosis not present

## 2022-11-15 DIAGNOSIS — C4492 Squamous cell carcinoma of skin, unspecified: Secondary | ICD-10-CM | POA: Diagnosis not present

## 2022-11-15 DIAGNOSIS — Z79899 Other long term (current) drug therapy: Secondary | ICD-10-CM | POA: Diagnosis not present

## 2022-11-15 DIAGNOSIS — R079 Chest pain, unspecified: Secondary | ICD-10-CM | POA: Diagnosis not present

## 2022-11-15 DIAGNOSIS — R1013 Epigastric pain: Secondary | ICD-10-CM | POA: Diagnosis not present

## 2022-11-15 DIAGNOSIS — J9809 Other diseases of bronchus, not elsewhere classified: Secondary | ICD-10-CM | POA: Diagnosis not present

## 2022-11-15 DIAGNOSIS — I4891 Unspecified atrial fibrillation: Secondary | ICD-10-CM | POA: Diagnosis not present

## 2022-11-15 DIAGNOSIS — R066 Hiccough: Secondary | ICD-10-CM | POA: Diagnosis not present

## 2022-11-15 DIAGNOSIS — N3000 Acute cystitis without hematuria: Secondary | ICD-10-CM | POA: Diagnosis not present

## 2022-11-15 DIAGNOSIS — J432 Centrilobular emphysema: Secondary | ICD-10-CM | POA: Diagnosis not present

## 2022-11-15 DIAGNOSIS — C01 Malignant neoplasm of base of tongue: Secondary | ICD-10-CM | POA: Diagnosis not present

## 2022-11-15 DIAGNOSIS — I10 Essential (primary) hypertension: Secondary | ICD-10-CM | POA: Diagnosis not present

## 2022-11-16 DIAGNOSIS — Z79899 Other long term (current) drug therapy: Secondary | ICD-10-CM | POA: Diagnosis not present

## 2022-11-16 DIAGNOSIS — C4492 Squamous cell carcinoma of skin, unspecified: Secondary | ICD-10-CM | POA: Diagnosis not present

## 2022-11-16 DIAGNOSIS — I10 Essential (primary) hypertension: Secondary | ICD-10-CM | POA: Diagnosis not present

## 2022-11-16 DIAGNOSIS — J449 Chronic obstructive pulmonary disease, unspecified: Secondary | ICD-10-CM | POA: Diagnosis not present

## 2022-11-16 DIAGNOSIS — I4891 Unspecified atrial fibrillation: Secondary | ICD-10-CM | POA: Diagnosis not present

## 2022-11-16 DIAGNOSIS — R1310 Dysphagia, unspecified: Secondary | ICD-10-CM | POA: Diagnosis not present

## 2022-11-22 DIAGNOSIS — R1110 Vomiting, unspecified: Secondary | ICD-10-CM | POA: Diagnosis not present

## 2022-11-22 DIAGNOSIS — C7951 Secondary malignant neoplasm of bone: Secondary | ICD-10-CM | POA: Diagnosis not present

## 2022-11-22 DIAGNOSIS — Z720 Tobacco use: Secondary | ICD-10-CM | POA: Diagnosis not present

## 2022-11-22 DIAGNOSIS — I4891 Unspecified atrial fibrillation: Secondary | ICD-10-CM | POA: Diagnosis not present

## 2022-11-22 DIAGNOSIS — E860 Dehydration: Secondary | ICD-10-CM | POA: Diagnosis not present

## 2022-11-25 ENCOUNTER — Telehealth: Payer: Self-pay | Admitting: Cardiology

## 2022-11-25 DIAGNOSIS — I480 Paroxysmal atrial fibrillation: Secondary | ICD-10-CM | POA: Insufficient documentation

## 2022-11-25 NOTE — Telephone Encounter (Signed)
Patient c/o Palpitations:  High priority if patient c/o lightheadedness, shortness of breath, or chest pain  How long have you had palpitations/irregular HR/ Afib? Are you having the symptoms now?  Patient's wife states the patient has been in afib for the past 2 weeks, she says it started after he began radiation  Are you currently experiencing lightheadedness, SOB or CP?  No   Do you have a history of afib (atrial fibrillation) or irregular heart rhythm?  No   Have you checked your BP or HR? (document readings if available):  BP is stable, HR fluctuates 120's-80's  Are you experiencing any other symptoms?  No

## 2022-11-25 NOTE — Telephone Encounter (Signed)
Just had 10 days of Radiation in a row and pt is feeling really weak from this. Hard to tell which s/s are from radiation and which are from Afib. Pt is currently taking Brilinta and aspirin.   Has been in Afib for 2 weeks. Appt scheduled for 11/26/22 at 0800.  ER precautions given. Verbalized understanding.

## 2022-11-25 NOTE — Progress Notes (Addendum)
Cardiology Office Note:   Date:  11/26/2022  ID:  [name], DOB 09/02/1950, MRN [id number]  History of Present Illness:   [name] is a 72 y.o. male who presents for follow up of CAD.  He was hospitalized in March of 2016 with an acute anterior myocardial infarction.  Catheterization demonstrated total occlusion of the LAD proximal to his previously placed stent. He had 30% RCA stenosis and 40% stenosis elsewhere. He was on Plavix at that time and seems to have failed that medication. He did have an ejection fraction of about 45% with anteroapical akinesis.  He has also seen Dr. Allyson Sabal and Dr. Edilia Bo for claudication.  He has had angiography and had aortobifemoral bypass graft with diffuse 70 - 80% proximal, mid and distal left SFA stenosis. He had 2 vessel runoff with an occluded peroneal.  He was not thought to be a candidate for PTA or redo surgery.   He called yesterday .  He was in the hospital last week at [facility] with  atrial fibrillation.  He left AMA.  I did review the  ED records for this visit.  He did increase his metoprolol.  He is being managed for recurrent throat cancer.  He was actually referred by the oncology center.  Was able to review the results of chest x-ray with multiple small new left-sided lung nodules.  He has bone metastasis apparently.  He did have atrial fibs documented with rapid ventricular rate 140.  He was treated with IV Cardizem bolus.  He does not feel the fibrillation.  He has been taking his heart rate since this all started and he says it goes from the 30s to the 110s.  He is not having any presyncope or syncope.  He is weak and having falls.  He is getting immunotherapy and radiation.  He was not eating well but he said he started.  He denies nausea vomiting or diaphoresis.  He has chronic dyspnea.  He is not describing new PND or orthopnea.  He has had no bleeding issues and he is not anemic.  ROS: As stated in the HPI and negative for all  other systems.  Studies Reviewed:    EKG: Atrial fibrillation with rapid ventricular rate, rate 147, low voltage is not different than previous.    Risk Assessment/Calculations:   Unchanged 1 3 times a day 3 times over 12-1/2 3 times a day CHA2DS2-VASc Score =     This indicates a  % annual risk of stroke. The patient's score is based upon: CHF History: 1 Diabetes History: 0 Stroke History: 0 Vascular Disease History: 1              Physical Exam:   VS:  BP 94/64   Pulse (!) 28   Ht 5\' 9"  (1.753 m)   Wt 189 lb (85.7 kg)   SpO2 98%   BMI 27.91 kg/m    Wt Readings from Last 3 Encounters:  11/26/22 189 lb (85.7 kg)  10/07/22 207 lb (93.9 kg)  09/10/22 207 lb (93.9 kg)     GEN: Well nourished, well developed in no acute distress NECK: No JVD; No carotid bruits CARDIAC: Irregular RR, no murmurs, rubs, gallops RESPIRATORY:  Clear to auscultation without rales, wheezing or rhonchi  ABDOMEN: Soft, non-tender, non-distended EXTREMITIES:  mild probably can end up edema; No deformity   ASSESSMENT AND PLAN:   Atrial fib: He has atrial fibrillation with what he describes as variable rate but seems to  be sustained.  I am going to change his metoprolol 12.5 mg 3 times daily.  His blood pressure precludes significant med titration.  I am any give him digoxin 0.25 mg once daily.  His electrolytes have been fine.  I am going to stop his Brilinta and aspirin and start him on Eliquis 5 mg twice daily.  He had normal repeat normal function and has no contraindication anticoagulation.  I suspect he might need TEE cardioversion.  I will apply a 3-day monitor.  Of note he has significant bony pain likely driving this.    CAD -  The patient has no new sypmtoms.  No further cardiovascular testing is indicated.  We will continue with aggressive risk reduction and meds as listed.   Hyperlipidemia -  He is statin intolerant and his LDL on PCSK9 is 30   Ischemic cardiomyopathy - His last  ejection fraction was 55%.  I will consider echo in the future but he appears to be euvolemic.      Signed, Rollene Rotunda, MD

## 2022-11-26 ENCOUNTER — Telehealth: Payer: Self-pay

## 2022-11-26 ENCOUNTER — Encounter: Payer: Self-pay | Admitting: Cardiology

## 2022-11-26 ENCOUNTER — Ambulatory Visit: Payer: Medicare HMO | Attending: Cardiology | Admitting: Cardiology

## 2022-11-26 ENCOUNTER — Ambulatory Visit (INDEPENDENT_AMBULATORY_CARE_PROVIDER_SITE_OTHER): Payer: Medicare HMO

## 2022-11-26 VITALS — BP 94/64 | HR 28 | Ht 69.0 in | Wt 189.0 lb

## 2022-11-26 DIAGNOSIS — I2510 Atherosclerotic heart disease of native coronary artery without angina pectoris: Secondary | ICD-10-CM

## 2022-11-26 DIAGNOSIS — I480 Paroxysmal atrial fibrillation: Secondary | ICD-10-CM | POA: Diagnosis not present

## 2022-11-26 DIAGNOSIS — E785 Hyperlipidemia, unspecified: Secondary | ICD-10-CM

## 2022-11-26 DIAGNOSIS — I255 Ischemic cardiomyopathy: Secondary | ICD-10-CM

## 2022-11-26 DIAGNOSIS — I25119 Atherosclerotic heart disease of native coronary artery with unspecified angina pectoris: Secondary | ICD-10-CM

## 2022-11-26 DIAGNOSIS — Z720 Tobacco use: Secondary | ICD-10-CM | POA: Diagnosis not present

## 2022-11-26 DIAGNOSIS — E782 Mixed hyperlipidemia: Secondary | ICD-10-CM | POA: Diagnosis not present

## 2022-11-26 MED ORDER — METOPROLOL TARTRATE 25 MG PO TABS
12.5000 mg | ORAL_TABLET | Freq: Three times a day (TID) | ORAL | 1 refills | Status: DC
Start: 2022-11-26 — End: 2022-12-10

## 2022-11-26 MED ORDER — DIGOXIN 250 MCG PO TABS: 0.2500 mg | ORAL_TABLET | Freq: Every day | ORAL | 3 refills | Status: DC

## 2022-11-26 MED ORDER — APIXABAN 5 MG PO TABS: 5.0000 mg | ORAL_TABLET | Freq: Two times a day (BID) | ORAL | 3 refills | Status: DC

## 2022-11-26 NOTE — Transitions of Care (Post Inpatient/ED Visit) (Signed)
   11/26/2022  Name: [name] MRN: [id number] DOB: [birth year]  Today's TOC FU Call Status: Today's TOC FU Call Status:: Unsuccessul Call (1st Attempt) Unsuccessful Call (1st Attempt) Date: 11/26/22  Attempted to reach the patient regarding the most recent Inpatient/ED visit.  Follow Up Plan: Additional outreach attempts will be made to reach the patient to complete the Transitions of Care (Post Inpatient/ED visit) call.   Jodelle Gross, RN, BSN, CCM Care Management Coordinator [place]

## 2022-11-26 NOTE — Patient Instructions (Addendum)
Medication Instructions:  STOP Brilinta STOP Aspirin  START Eliquis 5 mg twice daily  INCREASE Metoprolol to 12.5 mg three times daily  START Digoxin 0.25 daily   *If you need a refill on your cardiac medications before your next appointment, please call your pharmacy*   Testing/Procedures:  ZIO XT- Long Term Monitor Instructions  Your physician has requested you wear a ZIO patch monitor for 3 days.  This is a single patch monitor. [employer] supplies one patch monitor per enrollment. Additional stickers are not available. Please do not apply patch if you will be having a Nuclear Stress Test,  Echocardiogram, Cardiac CT, MRI, or Chest Xray during the period you would be wearing the  monitor. The patch cannot be worn during these tests. You cannot remove and re-apply the  ZIO XT patch monitor.  Your ZIO patch monitor will be mailed 3 day USPS to your address on file. It may take 3-5 days  to receive your monitor after you have been enrolled.  Once you have received your monitor, please review the enclosed instructions. Your monitor  has already been registered assigning a specific monitor serial # to you.  Billing and Patient Assistance Program Information  We have supplied [employer] with any of your insurance information on file for billing purposes. [employer] offers a sliding scale Patient Assistance Program for patients that do not have  insurance, or whose insurance does not completely cover the cost of the ZIO monitor.  You must apply for the Patient Assistance Program to qualify for this discounted rate.  To apply, please call [employer] at [phone], select option 4, select option 2, ask to apply for  Patient Assistance Program. Meredeth Ide will ask your household income, and how many people  are in your household. They will quote your out-of-pocket cost based on that information.  [employer] will also be able to set up a 68-month, interest-free payment plan if needed.  Applying the  monitor   Shave hair from upper left chest.  Hold abrader disc by orange tab. Rub abrader in 40 strokes over the upper left chest as  indicated in your monitor instructions.  Clean area with 4 enclosed alcohol pads. Let dry.  Apply patch as indicated in monitor instructions. Patch will be placed under collarbone on left  side of chest with arrow pointing upward.  Rub patch adhesive wings for 2 minutes. Remove white label marked "1". Remove the white  label marked "2". Rub patch adhesive wings for 2 additional minutes.  While looking in a mirror, press and release button in center of patch. A small green light will  flash 3-4 times. This will be your only indicator that the monitor has been turned on.  Do not shower for the first 24 hours. You may shower after the first 24 hours.  Press the button if you feel a symptom. You will hear a small click. Record Date, Time and  Symptom in the Patient Logbook.  When you are ready to remove the patch, follow instructions on the last 2 pages of Patient  Logbook. Stick patch monitor onto the last page of Patient Logbook.  Place Patient Logbook in the blue and white box. Use locking tab on box and tape box closed  securely. The blue and white box has prepaid postage on it. Please place it in the mailbox as  soon as possible. Your physician should have your test results approximately 7 days after the  monitor has been mailed back to [facility].  Call  [employer] Customer Care at [phone] if you have questions regarding  your ZIO XT patch monitor. Call them immediately if you see an orange light blinking on your  monitor.  If your monitor falls off in less than 4 days, contact our Monitor department at [phone].  If your monitor becomes loose or falls off after 4 days call [employer] at [phone] for  suggestions on securing your monitor    Follow-Up: At [facility], you and your health needs are our priority.  As part of  our continuing mission to provide you with exceptional heart care, we have created designated Provider Care Teams.  These Care Teams include your primary Cardiologist (physician) and Advanced Practice Providers (APPs -  Physician Assistants and Nurse Practitioners) who all work together to provide you with the care you need, when you need it.  We recommend signing up for the patient portal called "MyChart".  Sign up information is provided on this After Visit Summary.  MyChart is used to connect with patients for Virtual Visits (Telemedicine).  Patients are able to view lab/test results, encounter notes, upcoming appointments, etc.  Non-urgent messages can be sent to your provider as well.   To learn more about what you can do with MyChart, go to [email or web address].    Your next appointment:   June 27th at 11:30 AM  Provider:   Rollene Rotunda, MD

## 2022-11-26 NOTE — Progress Notes (Unsigned)
Enrolled patient for a 3 day Zio XT monitor to be mailed to patients home  

## 2022-11-27 ENCOUNTER — Telehealth: Payer: Self-pay

## 2022-11-27 NOTE — Transitions of Care (Post Inpatient/ED Visit) (Signed)
   11/27/2022  Name: [name] MRN: [id number] DOB: [birth year]  Today's TOC FU Call Status: Today's TOC FU Call Status:: Unsuccessful Call (2nd Attempt) Unsuccessful Call (2nd Attempt) Date: 11/27/22  Attempted to reach the patient regarding the most recent Inpatient/ED visit.  Follow Up Plan: Additional outreach attempts will be made to reach the patient to complete the Transitions of Care (Post Inpatient/ED visit) call.   Jodelle Gross, RN, BSN, CCM Care Management Coordinator [place]

## 2022-11-29 DIAGNOSIS — I480 Paroxysmal atrial fibrillation: Secondary | ICD-10-CM

## 2022-11-30 ENCOUNTER — Telehealth: Payer: Self-pay

## 2022-11-30 NOTE — Transitions of Care (Post Inpatient/ED Visit) (Signed)
   11/30/2022  Name: [name] MRN: [id number] DOB: 1950/10/08  Today's TOC FU Call Status: Today's TOC FU Call Status:: Unsuccessful Call (3rd Attempt) Unsuccessful Call (3rd Attempt) Date: 11/30/22  Attempted to reach the patient regarding the most recent Inpatient/ED visit.  Follow Up Plan: No further outreach attempts will be made at this time. We have been unable to contact the patient.  Jodelle Gross, RN, BSN, CCM Care Management Coordinator [place]

## 2022-12-01 DIAGNOSIS — Z95828 Presence of other vascular implants and grafts: Secondary | ICD-10-CM | POA: Diagnosis not present

## 2022-12-01 DIAGNOSIS — Z515 Encounter for palliative care: Secondary | ICD-10-CM | POA: Diagnosis not present

## 2022-12-01 DIAGNOSIS — C7951 Secondary malignant neoplasm of bone: Secondary | ICD-10-CM | POA: Diagnosis not present

## 2022-12-01 DIAGNOSIS — C109 Malignant neoplasm of oropharynx, unspecified: Secondary | ICD-10-CM | POA: Diagnosis not present

## 2022-12-01 DIAGNOSIS — R634 Abnormal weight loss: Secondary | ICD-10-CM | POA: Diagnosis not present

## 2022-12-01 DIAGNOSIS — G893 Neoplasm related pain (acute) (chronic): Secondary | ICD-10-CM | POA: Diagnosis not present

## 2022-12-01 DIAGNOSIS — C320 Malignant neoplasm of glottis: Secondary | ICD-10-CM | POA: Diagnosis not present

## 2022-12-07 DIAGNOSIS — I480 Paroxysmal atrial fibrillation: Secondary | ICD-10-CM | POA: Diagnosis not present

## 2022-12-10 ENCOUNTER — Telehealth: Payer: Self-pay | Admitting: *Deleted

## 2022-12-10 DIAGNOSIS — I25119 Atherosclerotic heart disease of native coronary artery with unspecified angina pectoris: Secondary | ICD-10-CM

## 2022-12-10 DIAGNOSIS — E782 Mixed hyperlipidemia: Secondary | ICD-10-CM

## 2022-12-10 MED ORDER — METOPROLOL TARTRATE 25 MG PO TABS
ORAL_TABLET | ORAL | 1 refills | Status: DC
Start: 2022-12-10 — End: 2023-10-12

## 2022-12-10 NOTE — Telephone Encounter (Signed)
Spoke with pt, aware of the results and recommendations. He will let us know when he needs a refill. He reports he is in a lot of pain first thing in the mornings.

## 2022-12-10 NOTE — Telephone Encounter (Signed)
-----   Message from Rollene Rotunda, MD sent at 12/09/2022  6:01 PM EDT ----- His HR is still elevated.  If he would tolerate I would like him to add another 1/2 metoprolol (12.5 mg every morning)  (That is, in the morning only he would take a whole metoprolol.)  Call Mr. Bolla with the results and send results to [place], Elige Radon, MD

## 2022-12-22 NOTE — Progress Notes (Deleted)
Cardiology Office Note:   Date:  12/22/2022  ID:  [name], DOB Mar 17, 1951, MRN [id number] PCP: Dettinger, Elige Radon, MD  [facility] Providers Cardiologist:  Rollene Rotunda, MD {  History of Present Illness:   [name] is a 72 y.o. male who presents for follow up of CAD.  He was hospitalized in March of 2016 with an acute anterior myocardial infarction.  Catheterization demonstrated total occlusion of the LAD proximal to his previously placed stent. He had 30% RCA stenosis and 40% stenosis elsewhere. He was on Plavix at that time and seems to have failed that medication. He did have an ejection fraction of about 45% with anteroapical akinesis.  He has also seen Dr. Allyson Sabal and Dr. Edilia Bo for claudication.  He has had angiography and had aortobifemoral bypass graft with diffuse 70 - 80% proximal, mid and distal left SFA stenosis. He had 2 vessel runoff with an occluded peroneal.  He was not thought to be a candidate for PTA or redo surgery.   He was in the hospital recently at [facility] with  atrial fibrillation.  He left AMA. He increased his metoprolol. He wore a monitor that demonstrated an elevated rate.  I increased his beta blocker.  ***   ***   I did review the  ED records for this visit.  He did increase his metoprolol.  He is being managed for recurrent throat cancer.  He was actually referred by the oncology center.  Was able to review the results of chest x-ray with multiple small new left-sided lung nodules.  He has bone metastasis apparently.  He did have atrial fibs documented with rapid ventricular rate 140.  He was treated with IV Cardizem bolus.  He does not feel the fibrillation.  He has been taking his heart rate since this all started and he says it goes from the 30s to the 110s.  He is not having any presyncope or syncope.  He is weak and having falls.  He is getting immunotherapy and radiation.  He was not eating well but he said he started.  He denies  nausea vomiting or diaphoresis.  He has chronic dyspnea.  He is not describing new PND or orthopnea.  He has had no bleeding issues and he is not anemic.  ROS: As stated in the HPI and negative for all other systems.  ROS: ***  Studies Reviewed:    EKG:       ***  Risk Assessment/Calculations:   {Does this patient have ATRIAL FIBRILLATION?[phone]} No BP recorded.  {Refresh Note OR Click here to enter BP  :1}***        Physical Exam:   VS:  There were no vitals taken for this visit.   Wt Readings from Last 3 Encounters:  11/26/22 189 lb (85.7 kg)  10/07/22 207 lb (93.9 kg)  09/10/22 207 lb (93.9 kg)     GEN: Well nourished, well developed in no acute distress NECK: No JVD; No carotid bruits CARDIAC: ***RRR, no murmurs, rubs, gallops RESPIRATORY:  Clear to auscultation without rales, wheezing or rhonchi  ABDOMEN: Soft, non-tender, non-distended EXTREMITIES:  No edema; No deformity   ASSESSMENT AND PLAN:   Atrial fib: ***   He has atrial fibrillation with what he describes as variable rate but seems to be sustained.  I am going to change his metoprolol 12.5 mg 3 times daily.  His blood pressure precludes significant med titration.  I am any give him digoxin 0.25 mg  once daily.  His electrolytes have been fine.  I am going to stop his Brilinta and aspirin and start him on Eliquis 5 mg twice daily.  He had normal repeat normal function and has no contraindication anticoagulation.  I suspect he might need TEE cardioversion.  I will apply a 3-day monitor.  Of note he has significant bony pain likely driving this.     CAD :  ***    The patient has no new sypmtoms.  No further cardiovascular testing is indicated.  We will continue with aggressive risk reduction and meds as listed.   Hyperlipidemia:  ***   He is statin intolerant and his LDL on PCSK9 is 30   Ischemic cardiomyopathy:  ***  - His last ejection fraction was 55%.  I will consider echo in the future but he appears to  be euvolemic.        {Are you ordering a CV Procedure (e.g. stress test, cath, DCCV, TEE, etc)?   Press F2        :[id number]}  Follow up ***  Signed, Rollene Rotunda, MD

## 2022-12-24 ENCOUNTER — Telehealth: Payer: Self-pay | Admitting: Cardiology

## 2022-12-24 ENCOUNTER — Ambulatory Visit: Payer: Medicare HMO | Admitting: Cardiology

## 2022-12-24 DIAGNOSIS — E785 Hyperlipidemia, unspecified: Secondary | ICD-10-CM

## 2022-12-24 DIAGNOSIS — I25119 Atherosclerotic heart disease of native coronary artery with unspecified angina pectoris: Secondary | ICD-10-CM

## 2022-12-24 DIAGNOSIS — I4819 Other persistent atrial fibrillation: Secondary | ICD-10-CM

## 2022-12-24 DIAGNOSIS — I255 Ischemic cardiomyopathy: Secondary | ICD-10-CM

## 2022-12-24 NOTE — Telephone Encounter (Signed)
Patient does not feel well and is canceling his appointment for today. He would like to reschedule for a sooner appointment with Dr. Antoine Poche hopefully in [place] location

## 2022-12-24 NOTE — Telephone Encounter (Signed)
Patient's wife is calling stating that patient will not be able to make appt today due to sickness from cancer treatment and will most likely be going to the ED. She would like a call back to see if they could schedule a sooner available f/u appt with Dr. Antoine Poche at the [place] office. Please advise.

## 2022-12-25 NOTE — Telephone Encounter (Signed)
Spoke with pt yesterday and he reported he is not able to come to [employer]. The next time dr hochrein is in [place] is not until the end of July. Dr hochrein would like to see the patient sooner if he can get to [place]. Unable to reach pt or leave a message

## 2022-12-29 NOTE — Telephone Encounter (Signed)
Spoke with pt, he reports he can not get to [employer]. Follow up scheduled for 01/27/23, the next time dr hochrein is in [place].

## 2023-01-01 DIAGNOSIS — C7951 Secondary malignant neoplasm of bone: Secondary | ICD-10-CM | POA: Diagnosis not present

## 2023-01-01 DIAGNOSIS — I69354 Hemiplegia and hemiparesis following cerebral infarction affecting left non-dominant side: Secondary | ICD-10-CM | POA: Diagnosis not present

## 2023-01-01 DIAGNOSIS — R112 Nausea with vomiting, unspecified: Secondary | ICD-10-CM | POA: Diagnosis not present

## 2023-01-01 DIAGNOSIS — Z8589 Personal history of malignant neoplasm of other organs and systems: Secondary | ICD-10-CM | POA: Diagnosis not present

## 2023-01-01 DIAGNOSIS — M25511 Pain in right shoulder: Secondary | ICD-10-CM | POA: Diagnosis not present

## 2023-01-01 DIAGNOSIS — J449 Chronic obstructive pulmonary disease, unspecified: Secondary | ICD-10-CM | POA: Diagnosis not present

## 2023-01-01 DIAGNOSIS — C3490 Malignant neoplasm of unspecified part of unspecified bronchus or lung: Secondary | ICD-10-CM | POA: Diagnosis not present

## 2023-01-01 DIAGNOSIS — C7800 Secondary malignant neoplasm of unspecified lung: Secondary | ICD-10-CM | POA: Diagnosis not present

## 2023-01-01 DIAGNOSIS — I2510 Atherosclerotic heart disease of native coronary artery without angina pectoris: Secondary | ICD-10-CM | POA: Diagnosis not present

## 2023-01-01 DIAGNOSIS — C61 Malignant neoplasm of prostate: Secondary | ICD-10-CM | POA: Diagnosis not present

## 2023-01-01 DIAGNOSIS — C329 Malignant neoplasm of larynx, unspecified: Secondary | ICD-10-CM | POA: Diagnosis not present

## 2023-01-02 DIAGNOSIS — R112 Nausea with vomiting, unspecified: Secondary | ICD-10-CM | POA: Diagnosis not present

## 2023-01-11 DIAGNOSIS — S22030A Wedge compression fracture of third thoracic vertebra, initial encounter for closed fracture: Secondary | ICD-10-CM | POA: Diagnosis not present

## 2023-01-11 DIAGNOSIS — K5900 Constipation, unspecified: Secondary | ICD-10-CM | POA: Diagnosis not present

## 2023-01-11 DIAGNOSIS — C7951 Secondary malignant neoplasm of bone: Secondary | ICD-10-CM | POA: Diagnosis not present

## 2023-01-11 DIAGNOSIS — R1310 Dysphagia, unspecified: Secondary | ICD-10-CM | POA: Diagnosis not present

## 2023-01-11 DIAGNOSIS — R109 Unspecified abdominal pain: Secondary | ICD-10-CM | POA: Diagnosis not present

## 2023-01-11 DIAGNOSIS — E876 Hypokalemia: Secondary | ICD-10-CM | POA: Diagnosis not present

## 2023-01-11 DIAGNOSIS — M8440XA Pathological fracture, unspecified site, initial encounter for fracture: Secondary | ICD-10-CM | POA: Diagnosis not present

## 2023-01-11 DIAGNOSIS — R112 Nausea with vomiting, unspecified: Secondary | ICD-10-CM | POA: Diagnosis not present

## 2023-01-11 DIAGNOSIS — I771 Stricture of artery: Secondary | ICD-10-CM | POA: Diagnosis not present

## 2023-01-26 DIAGNOSIS — I5032 Chronic diastolic (congestive) heart failure: Secondary | ICD-10-CM | POA: Insufficient documentation

## 2023-01-26 NOTE — Progress Notes (Deleted)
Cardiology Office Note:   Date:  01/26/2023  ID:  [name], DOB 05-06-1951, MRN [id number] PCP: Dettinger, Elige Radon, MD  [facility] Providers Cardiologist:  Rollene Rotunda, MD {  History of Present Illness:   [name] is a 72 y.o. male who presents for follow up of CAD.  He was hospitalized in March of 2016 with an acute anterior myocardial infarction.  Catheterization demonstrated total occlusion of the LAD proximal to his previously placed stent. He had 30% RCA stenosis and 40% stenosis elsewhere. He was on Plavix at that time and seems to have failed that medication. He did have an ejection fraction of about 45% with anteroapical akinesis.  He has also seen Dr. Allyson [name] and Dr. Edilia Bo for claudication.  He has had angiography and had aortobifemoral bypass graft with diffuse 70 - 80% proximal, mid and distal left SFA stenosis. He had 2 vessel runoff with an occluded peroneal.  He was not thought to be a candidate for PTA or redo surgery.    He called yesterday .  He was in the hospital in May at [facility] with  atrial fibrillation.  He left AMA. He had his metoprolol increased.  He did have atrial fibs documented with rapid ventricular rate 140.  He was treated with IV Cardizem bolus.  When I saw him in the office he had rapid rate with low BPs.  I changed his metoprolol and added dig.  ***   ***   I did review the  ED records for this visit.  He did increase his metoprolol.  He is being managed for recurrent throat cancer.  He was actually referred by the oncology center.  Was able to review the results of chest x-ray with multiple small new left-sided lung nodules.  He has bone metastasis apparently.     He does not feel the fibrillation.  He has been taking his heart rate since this all started and he says it goes from the 30s to the 110s.  He is not having any presyncope or syncope.  He is weak and having falls.  He is getting immunotherapy and radiation.  He was not  eating well but he said he started.  He denies nausea vomiting or diaphoresis.  He has chronic dyspnea.  He is not describing new PND or orthopnea.  He has had no bleeding issues and he is not anemic.  ROS: ***  Studies Reviewed:    EKG:       ***  Risk Assessment/Calculations:   {Does this patient have ATRIAL FIBRILLATION?:[phone]} No BP recorded.  {Refresh Note OR Click here to enter BP  :1}***        Physical Exam:   VS:  There were no vitals taken for this visit.   Wt Readings from Last 3 Encounters:  11/26/22 189 lb (85.7 kg)  10/07/22 207 lb (93.9 kg)  09/10/22 207 lb (93.9 kg)     GEN: Well nourished, well developed in no acute distress NECK: No JVD; No carotid bruits CARDIAC: ***RR, no murmurs, rubs, gallops RESPIRATORY:  Clear to auscultation without rales, wheezing or rhonchi  ABDOMEN: Soft, non-tender, non-distended EXTREMITIES:  No edema; No deformity   ASSESSMENT AND PLAN:   Atrial fib: ***   He has atrial fibrillation with what he describes as variable rate but seems to be sustained.  I am going to change his metoprolol 12.5 mg 3 times daily.  His blood pressure precludes significant med titration.  I am  any give him digoxin 0.25 mg once daily.  His electrolytes have been fine.  I am going to stop his Brilinta and aspirin and start him on Eliquis 5 mg twice daily.  He had normal repeat normal function and has no contraindication anticoagulation.  I suspect he might need TEE cardioversion.  I will apply a 3-day monitor.  Of note he has significant bony pain likely driving this.     CAD :  ***  -  The patient has no new sypmtoms.  No further cardiovascular testing is indicated.  We will continue with aggressive risk reduction and meds as listed.   Hyperlipidemia :  ***  -  He is statin intolerant and his LDL on PCSK9 is 30   Ischemic cardiomyopathy :  ***  - His last ejection fraction was 55%.  I will consider echo in the future but he appears to be euvolemic.       {Are you ordering a CV Procedure (e.g. stress test, cath, DCCV, TEE, etc)?   Press F2        :[id number]}  Follow up ***  Signed, Rollene Rotunda, MD

## 2023-01-27 ENCOUNTER — Ambulatory Visit: Payer: Medicare HMO | Admitting: Cardiology

## 2023-01-27 DIAGNOSIS — I25119 Atherosclerotic heart disease of native coronary artery with unspecified angina pectoris: Secondary | ICD-10-CM

## 2023-01-27 DIAGNOSIS — E785 Hyperlipidemia, unspecified: Secondary | ICD-10-CM

## 2023-01-27 DIAGNOSIS — I255 Ischemic cardiomyopathy: Secondary | ICD-10-CM

## 2023-01-27 DIAGNOSIS — I5032 Chronic diastolic (congestive) heart failure: Secondary | ICD-10-CM

## 2023-01-30 DIAGNOSIS — R000 Tachycardia, unspecified: Secondary | ICD-10-CM | POA: Diagnosis not present

## 2023-01-30 DIAGNOSIS — R112 Nausea with vomiting, unspecified: Secondary | ICD-10-CM | POA: Diagnosis not present

## 2023-01-30 DIAGNOSIS — I10 Essential (primary) hypertension: Secondary | ICD-10-CM | POA: Diagnosis not present

## 2023-01-30 DIAGNOSIS — C799 Secondary malignant neoplasm of unspecified site: Secondary | ICD-10-CM | POA: Diagnosis not present

## 2023-01-30 DIAGNOSIS — R0689 Other abnormalities of breathing: Secondary | ICD-10-CM | POA: Diagnosis not present

## 2023-01-30 DIAGNOSIS — E7800 Pure hypercholesterolemia, unspecified: Secondary | ICD-10-CM | POA: Diagnosis not present

## 2023-01-30 DIAGNOSIS — M25519 Pain in unspecified shoulder: Secondary | ICD-10-CM | POA: Diagnosis not present

## 2023-01-30 DIAGNOSIS — M549 Dorsalgia, unspecified: Secondary | ICD-10-CM | POA: Diagnosis not present

## 2023-01-30 DIAGNOSIS — E785 Hyperlipidemia, unspecified: Secondary | ICD-10-CM | POA: Diagnosis not present

## 2023-01-30 DIAGNOSIS — G8929 Other chronic pain: Secondary | ICD-10-CM | POA: Diagnosis not present

## 2023-01-30 DIAGNOSIS — G893 Neoplasm related pain (acute) (chronic): Secondary | ICD-10-CM | POA: Diagnosis not present

## 2023-01-30 DIAGNOSIS — R1110 Vomiting, unspecified: Secondary | ICD-10-CM | POA: Diagnosis not present

## 2023-01-30 DIAGNOSIS — J449 Chronic obstructive pulmonary disease, unspecified: Secondary | ICD-10-CM | POA: Diagnosis not present

## 2023-03-10 ENCOUNTER — Other Ambulatory Visit: Payer: Self-pay | Admitting: Cardiology

## 2023-03-10 DIAGNOSIS — I739 Peripheral vascular disease, unspecified: Secondary | ICD-10-CM

## 2023-03-10 DIAGNOSIS — I2510 Atherosclerotic heart disease of native coronary artery without angina pectoris: Secondary | ICD-10-CM

## 2023-03-10 DIAGNOSIS — E785 Hyperlipidemia, unspecified: Secondary | ICD-10-CM

## 2023-03-11 ENCOUNTER — Telehealth: Payer: Self-pay | Admitting: Family Medicine

## 2023-03-12 ENCOUNTER — Ambulatory Visit: Payer: Medicare HMO | Admitting: Family Medicine

## 2023-03-30 DEATH — deceased
